# Patient Record
Sex: Female | Born: 1940
Health system: Southern US, Community
[De-identification: ages and names within clinical notes are randomized; demographics above are authoritative.]

## PROBLEM LIST (undated history)

## (undated) DIAGNOSIS — Z9889 Other specified postprocedural states: Secondary | ICD-10-CM

## (undated) DIAGNOSIS — T4145XA Adverse effect of unspecified anesthetic, initial encounter: Secondary | ICD-10-CM

## (undated) DIAGNOSIS — H269 Unspecified cataract: Secondary | ICD-10-CM

## (undated) DIAGNOSIS — T8859XA Other complications of anesthesia, initial encounter: Secondary | ICD-10-CM

## (undated) DIAGNOSIS — I8390 Asymptomatic varicose veins of unspecified lower extremity: Secondary | ICD-10-CM

## (undated) DIAGNOSIS — I10 Essential (primary) hypertension: Secondary | ICD-10-CM

## (undated) DIAGNOSIS — R112 Nausea with vomiting, unspecified: Secondary | ICD-10-CM

## (undated) DIAGNOSIS — T7840XA Allergy, unspecified, initial encounter: Secondary | ICD-10-CM

## (undated) HISTORY — PX: VARICOSE VEIN SURGERY: SHX832

## (undated) HISTORY — PX: APPENDECTOMY: SHX54

## (undated) HISTORY — DX: Unspecified cataract: H26.9

## (undated) HISTORY — DX: Allergy, unspecified, initial encounter: T78.40XA

## (undated) HISTORY — PX: CATARACT EXTRACTION: SUR2

## (undated) HISTORY — PX: HERNIA REPAIR: SHX51

## (undated) HISTORY — PX: ABDOMINAL HYSTERECTOMY: SHX81

---
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
## 2012-07-03 ENCOUNTER — Ambulatory Visit (INDEPENDENT_AMBULATORY_CARE_PROVIDER_SITE_OTHER): Payer: Medicare Other | Admitting: Emergency Medicine

## 2012-07-03 ENCOUNTER — Ambulatory Visit: Payer: Medicare Other

## 2012-07-03 VITALS — BP 155/74 | HR 88 | Temp 98.2°F | Resp 16 | Ht 61.5 in | Wt 125.2 lb

## 2012-07-03 DIAGNOSIS — R51 Headache: Secondary | ICD-10-CM

## 2012-07-03 DIAGNOSIS — J019 Acute sinusitis, unspecified: Secondary | ICD-10-CM

## 2012-07-03 LAB — POCT CBC
Granulocyte percent: 82.4 %G — AB (ref 37–80)
MCV: 95.9 fL (ref 80–97)
MID (cbc): 0.5 (ref 0–0.9)
MPV: 9.2 fL (ref 0–99.8)
POC Granulocyte: 8.2 — AB (ref 2–6.9)
POC LYMPH PERCENT: 12.2 %L (ref 10–50)
POC MID %: 5.4 %M (ref 0–12)
Platelet Count, POC: 231 10*3/uL (ref 142–424)
RDW, POC: 13.4 %

## 2012-07-03 MED ORDER — TRAMADOL HCL 50 MG PO TABS: 50.0000 mg | ORAL_TABLET | Freq: Three times a day (TID) | ORAL | Status: DC | PRN

## 2012-07-03 NOTE — Progress Notes (Signed)
  Subjective:    Patient ID: Abigail Acosta, female    DOB: 1940-04-21, 72 y.o.   MRN: [id number]  HPI nation presents with a severe frontal headache. She is having severe pain across the front of her face and nose. She did not have much drainage. She has had a history of sinus surgery in the past. She denies any fever or chills.    Review of Systems patient has a history of osteoporosis and is currently on Premarin for this.     Objective:   Physical Exam she's had left cataract surgery. The discs are flat. Cranial nerves are intact. There is no focal weakness upper or lower extremities. There is tenderness over the facial area. Posterior pharynx is clear. Neck is supple. Chest is clear. Heart rate no murmurs.  [facility] reading (PRIMARY) by  Dr. Cleta Alberts she has large sinus cavities but I did not see any air-fluid levels.  Results for orders placed in visit on 07/03/12  POCT CBC      Result Value Range   WBC 9.9  4.6 - 10.2 K/uL   Lymph, poc 1.2  0.6 - 3.4   POC LYMPH PERCENT 12.2  10 - 50 %L   MID (cbc) 0.5  0 - 0.9   POC MID % 5.4  0 - 12 %M   POC Granulocyte 8.2 (*) 2 - 6.9   Granulocyte percent 82.4 (*) 37 - 80 %G   RBC 4.85  4.04 - 5.48 M/uL   Hemoglobin 14.8  12.2 - 16.2 g/dL   HCT, POC 40.9  81.1 - 47.9 %   MCV 95.9  80 - 97 fL   MCH, POC 30.5  27 - 31.2 pg   MCHC 31.8  31.8 - 35.4 g/dL   RDW, POC 91.4     Platelet Count, POC 231  142 - 424 K/uL   MPV 9.2  0 - 99.8 fL        Assessment & Plan:   Repeat BP at 11:42: 172/90- manual. Patient does not want to be on an ABX. Pt does not want to do saline rinses or have a scan. She was advised to take her BP meds when she gets home today. She will take Ultram for her HA.

## 2013-04-03 ENCOUNTER — Other Ambulatory Visit: Payer: Self-pay | Admitting: Urology

## 2013-04-13 ENCOUNTER — Encounter (HOSPITAL_COMMUNITY): Payer: Self-pay | Admitting: Pharmacy Technician

## 2013-04-16 ENCOUNTER — Encounter (HOSPITAL_COMMUNITY): Payer: Self-pay

## 2013-04-16 ENCOUNTER — Encounter (HOSPITAL_COMMUNITY)
Admission: RE | Admit: 2013-04-16 | Discharge: 2013-04-16 | Disposition: A | Discharge status: Home / Self Care | Payer: Medicare Other | Source: Ambulatory Visit | Attending: Urology | Admitting: Urology

## 2013-04-16 ENCOUNTER — Ambulatory Visit (HOSPITAL_COMMUNITY)
Admission: RE | Admit: 2013-04-16 | Discharge: 2013-04-16 | Disposition: A | Discharge status: Home / Self Care | Payer: Medicare Other | Source: Ambulatory Visit | Attending: Anesthesiology | Admitting: Anesthesiology

## 2013-04-16 DIAGNOSIS — Z01812 Encounter for preprocedural laboratory examination: Secondary | ICD-10-CM | POA: Insufficient documentation

## 2013-04-16 DIAGNOSIS — Z01818 Encounter for other preprocedural examination: Secondary | ICD-10-CM | POA: Insufficient documentation

## 2013-04-16 DIAGNOSIS — Z01810 Encounter for preprocedural cardiovascular examination: Secondary | ICD-10-CM | POA: Insufficient documentation

## 2013-04-16 HISTORY — DX: Asymptomatic varicose veins of unspecified lower extremity: I83.90

## 2013-04-16 LAB — BASIC METABOLIC PANEL
BUN: 19 mg/dL (ref 6–23)
CALCIUM: 9.8 mg/dL (ref 8.4–10.5)
CO2: 29 mEq/L (ref 19–32)
Chloride: 98 mEq/L (ref 96–112)
Creatinine, Ser: 0.95 mg/dL (ref 0.50–1.10)
GFR calc Af Amer: 68 mL/min — ABNORMAL LOW (ref >90)
GFR, EST NON AFRICAN AMERICAN: 59 mL/min — AB (ref >90)
Glucose, Bld: 88 mg/dL (ref 70–99)
Potassium: 3.5 mEq/L — ABNORMAL LOW (ref 3.7–5.3)
SODIUM: 140 meq/L (ref 137–147)

## 2013-04-16 LAB — CBC
HEMATOCRIT: 44 % (ref 36.0–46.0)
Hemoglobin: 15 g/dL (ref 12.0–15.0)
MCH: 31.1 pg (ref 26.0–34.0)
MCHC: 34.1 g/dL (ref 30.0–36.0)
MCV: 91.1 fL (ref 78.0–100.0)
Platelets: 212 10*3/uL (ref 150–400)
RBC: 4.83 MIL/uL (ref 3.87–5.11)
RDW: 12.8 % (ref 11.5–15.5)
WBC: 6.9 10*3/uL (ref 4.0–10.5)

## 2013-04-16 LAB — PROTIME-INR
INR: 0.97 (ref 0.00–1.49)
Prothrombin Time: 12.7 seconds (ref 11.6–15.2)

## 2013-04-16 LAB — APTT: APTT: 34 s (ref 24–37)

## 2013-04-16 NOTE — Pre-Procedure Instructions (Signed)
04-16-13 EKG/ CXR done today.

## 2013-04-16 NOTE — Patient Instructions (Signed)
[place]  04/16/2013   Your procedure is scheduled on:  4-14 -2015  Report to [facility] at    [place]    AM .  Call this number if you have problems the morning of surgery: [phone]  Or Presurgical Testing [phone](Vaishali Baise) For Living Will and/or Health Care Power Attorney Forms: please provide copy for your medical record,may bring AM of surgery(Forms should be already notarized -we do not provide this service).(04-16-13 To bring copy AM of 04-28-13 to place with medical records).  Remember: Follow any bowel prep instructions per MD office.   Do not eat food:After Midnight.  .  Take these medicines the morning of surgery with A SIP OF WATER: none, may take Zyrtec if needed.   Do not wear jewelry, make-up or nail polish.  Do not wear lotions, powders, or perfumes. You may wear deodorant.  Do not shave 48 hours(2 days) prior to first CHG shower(legs and under arms).(Shaving face and neck okay.)  Do not bring valuables to the hospital.(Hospital is not responsible for lost valuables).  Contacts, dentures or removable bridgework, body piercing, hair pins may not be worn into surgery.  Leave suitcase in the car. After surgery it may be brought to your room.  For patients admitted to the hospital, checkout time is 11:00 AM the day of discharge.(Restricted visitors-Any Persons displaying flu-like symptoms or illness).    Patients discharged the day of surgery will not be allowed to drive home. Must have responsible person with you x 24 hours once discharged.  Name and phone number of your driver: spouse Soo Steelman [phone] cell  Special Instructions: CHG(Chlorhedine 4%-"Hibiclens","Betasept","Aplicare") Shower Use Special Wash: see special instructions.(avoid face and genitals)   Please read over the following fact sheets that you were given:  Blood Transfusion fact sheet. Remember : Type/Screen "Blue armbands" - may not be removed once applied(would result in  being retested AM of surgery, if removed).  Failure to follow these instructions may result in Cancellation of your surgery.   Patient signature_______________________________________________________

## 2013-04-27 MED ORDER — GENTAMICIN SULFATE 40 MG/ML IJ SOLN
280.0000 mg | INTRAVENOUS | Status: AC
  Administered 2013-04-28: 280 mg via INTRAVENOUS
  Filled 2013-04-27: qty 7

## 2013-04-27 NOTE — H&P (Signed)
History of Present Illness   Abigail Acosta has symptomatic pelvic organ prolapse. She has had a hysterectomy. She has moderate nocturia and milder frequency. When she splints, she has a good flow. She has had 2 right hernia repair with a femoral and/or inguinal hernia repair and even had mesh. She is an active person. She leaks a little bit when she squats but generally is continent not wearing pads and denies urgency. On pelvic examination she had a grade 3 cystocele with central defect. The vaginal cuff descended from 8 or 9 cm to 5 cm. She had a mild grade 2 rectocele that was fairly diffuse. She did not have an obvious enterocele. She had a negative cough test after cystoscopy.   If she ever had surgery, she likely benefit from a transvaginal vault suspension with cystocele repair and graft and she may need a rectocele repair. She had a little bit of deficiency of the posterior fourchette and a final decision will be made intraoperatively. [place] She wants to have surgery soon because she is very active in the garden.   She did not void and was catheterized for 75 mL. Maximum capacity was 250 mL. Bladder was stable. She had sensory urgency. She did not leak with a Valsalva pressure of 92 cmH2O. She could not cough that hard. During voluntary voiding, she voided 245 mL with a maximum flow of 18 mL/sec. Maximum voiding pressure was 23 cmH2O. She emptied efficiently. Bladder neck descended 3 cm. She has a significant cystocele fluoroscopically. EMG activity was normal during the voiding phase. The details of the urodynamics are signed and dictated on the urodynamic sheet.    Past Medical History Problems  1. History of hypercholesterolemia (V12.29) 2. History of hypertension (V12.59)  Surgical History Problems  1. History of Hernia Repair 2. History of Hysterectomy 3. History of Tonsillectomy 4. History of Varicose Vein Ligation  Current Meds 1.  Captopril-Hydrochlorothiazide 25-25 MG Oral Tablet;  Therapy: (Recorded:14Jan2015) to Recorded 2. Potassium TABS;  Therapy: (Recorded:14Jan2015) to Recorded 3. Premarin TABS;  Therapy: (Recorded:14Jan2015) to Recorded 4. Vitamin B12 TABS;  Therapy: (Recorded:14Jan2015) to Recorded 5. Vitamin C TABS;  Therapy: (Recorded:14Jan2015) to Recorded 6. Vitamin D TABS;  Therapy: (Recorded:14Jan2015) to Recorded  Allergies Medication  1. No Known Drug Allergies Non-Medication  2. Dust 3. Grass 4. Mold  Family History Problems  1. Family history of lung cancer (V16.1) : Mother, Father  Social History Problems  1. Denied: History of Alcohol use 2. Caffeine use (V49.89)   6 cups of coffee 3. Father deceased 3. Former smoker [place])   smoked 3 cigs daily for 2 years; quit 50 years ago 53. Married 6. Mother deceased 70. 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 Cystocele, midline (618.01) 2. Increased urinary frequency (788.41)  Discussion/Summary   The patient is sexually active. Mesh issues discussed. We had a long chat about whether  or not she should proceed with surgery and I truly read Abigail Krausz that she would like to have surgery because it does certainly effect her quality of life. She does a lot of splinting each time she urinates, except the first thing in the morning when she has been supine. I think I scheduled time with Pam and she will meet Pam today, but she can always cancel.   Abigail Lundy thinks she feels a  little bit of pressure when she urinates from the bowel. This will be taken into consideration when I am making a decision intraoperatively. De novo or worsening incontinence also discussed, but we are not going to do a sling.  After a thorough review of the management options for the patient's condition the patient  elected to proceed with surgical therapy as noted above. We have discussed the potential benefits and risks of the procedure, side effects of the proposed treatment, the likelihood of the patient achieving the goals of the procedure, and any potential problems that might occur during the procedure or recuperation. Informed consent has been obtained.

## 2013-04-28 ENCOUNTER — Observation Stay (HOSPITAL_COMMUNITY)
Admission: RE | Admit: 2013-04-28 | Discharge: 2013-04-29 | Disposition: A | Discharge status: Home / Self Care | Payer: Medicare Other | Source: Ambulatory Visit | Attending: Urology | Admitting: Urology

## 2013-04-28 ENCOUNTER — Ambulatory Visit (HOSPITAL_COMMUNITY): Payer: Medicare Other | Admitting: Anesthesiology

## 2013-04-28 ENCOUNTER — Encounter (HOSPITAL_COMMUNITY)
Admission: RE | Disposition: A | Discharge status: Home / Self Care | Payer: Self-pay | Source: Ambulatory Visit | Attending: Urology

## 2013-04-28 ENCOUNTER — Encounter (HOSPITAL_COMMUNITY): Payer: Self-pay | Admitting: *Deleted

## 2013-04-28 ENCOUNTER — Encounter (HOSPITAL_COMMUNITY): Payer: Medicare Other | Admitting: Anesthesiology

## 2013-04-28 DIAGNOSIS — Z87891 Personal history of nicotine dependence: Secondary | ICD-10-CM | POA: Insufficient documentation

## 2013-04-28 DIAGNOSIS — R350 Frequency of micturition: Secondary | ICD-10-CM | POA: Insufficient documentation

## 2013-04-28 DIAGNOSIS — Z79899 Other long term (current) drug therapy: Secondary | ICD-10-CM | POA: Insufficient documentation

## 2013-04-28 DIAGNOSIS — N993 Prolapse of vaginal vault after hysterectomy: Principal | ICD-10-CM | POA: Insufficient documentation

## 2013-04-28 DIAGNOSIS — IMO0002 Reserved for concepts with insufficient information to code with codable children: Secondary | ICD-10-CM | POA: Diagnosis present

## 2013-04-28 DIAGNOSIS — E7800 Pure hypercholesterolemia, unspecified: Secondary | ICD-10-CM | POA: Insufficient documentation

## 2013-04-28 DIAGNOSIS — I10 Essential (primary) hypertension: Secondary | ICD-10-CM | POA: Insufficient documentation

## 2013-04-28 DIAGNOSIS — R109 Unspecified abdominal pain: Secondary | ICD-10-CM | POA: Insufficient documentation

## 2013-04-28 HISTORY — PX: CYSTOSCOPY: SHX5120

## 2013-04-28 HISTORY — PX: ANTERIOR AND POSTERIOR REPAIR: SHX5121

## 2013-04-28 HISTORY — PX: VAGINAL PROLAPSE REPAIR: SHX830

## 2013-04-28 LAB — ABO/RH: ABO/RH(D): O POS

## 2013-04-28 LAB — HEMOGLOBIN AND HEMATOCRIT, BLOOD
HCT: 39.7 % (ref 36.0–46.0)
HEMOGLOBIN: 13.4 g/dL (ref 12.0–15.0)

## 2013-04-28 LAB — TYPE AND SCREEN
ABO/RH(D): O POS
Antibody Screen: NEGATIVE

## 2013-04-28 SURGERY — CYSTOSCOPY
Anesthesia: General

## 2013-04-28 MED ORDER — METHYLENE BLUE 1 % INJ SOLN
INTRAMUSCULAR | Status: AC
  Filled 2013-04-28: qty 10

## 2013-04-28 MED ORDER — PROPOFOL 10 MG/ML IV BOLUS
INTRAVENOUS | Status: AC
  Filled 2013-04-28: qty 20

## 2013-04-28 MED ORDER — HYDROCODONE-ACETAMINOPHEN 5-325 MG PO TABS: 1.0000 | ORAL_TABLET | Freq: Four times a day (QID) | ORAL | Status: DC | PRN

## 2013-04-28 MED ORDER — ONDANSETRON HCL 4 MG/2ML IJ SOLN
INTRAMUSCULAR | Status: AC
  Filled 2013-04-28: qty 2

## 2013-04-28 MED ORDER — MIDAZOLAM HCL 5 MG/5ML IJ SOLN
INTRAMUSCULAR | Status: DC | PRN
  Administered 2013-04-28: 2 mg via INTRAVENOUS

## 2013-04-28 MED ORDER — PROPOFOL 10 MG/ML IV BOLUS
INTRAVENOUS | Status: DC | PRN
  Administered 2013-04-28: 170 mg via INTRAVENOUS

## 2013-04-28 MED ORDER — NALOXONE HCL 0.4 MG/ML IJ SOLN: 0.4000 mg | INTRAMUSCULAR | Status: DC | PRN

## 2013-04-28 MED ORDER — PROMETHAZINE HCL 25 MG/ML IJ SOLN
6.2500 mg | INTRAMUSCULAR | Status: DC | PRN
  Administered 2013-04-28: 6.25 mg via INTRAVENOUS

## 2013-04-28 MED ORDER — MORPHINE SULFATE 2 MG/ML IJ SOLN
2.0000 mg | INTRAMUSCULAR | Status: DC | PRN
  Administered 2013-04-28 (×2): 2 mg via INTRAVENOUS
  Filled 2013-04-28 (×2): qty 1

## 2013-04-28 MED ORDER — EPHEDRINE SULFATE 50 MG/ML IJ SOLN: INTRAMUSCULAR | Status: DC | PRN

## 2013-04-28 MED ORDER — DEXAMETHASONE SODIUM PHOSPHATE 4 MG/ML IJ SOLN
INTRAMUSCULAR | Status: DC | PRN
  Administered 2013-04-28: 10 mg via INTRAVENOUS

## 2013-04-28 MED ORDER — STERILE WATER FOR IRRIGATION IR SOLN
Status: DC | PRN
  Administered 2013-04-28: 3000 mL

## 2013-04-28 MED ORDER — ESTRADIOL 0.1 MG/GM VA CREA
TOPICAL_CREAM | VAGINAL | Status: AC
  Filled 2013-04-28: qty 85

## 2013-04-28 MED ORDER — ESTRADIOL 0.1 MG/GM VA CREA
TOPICAL_CREAM | VAGINAL | Status: DC | PRN
  Administered 2013-04-28: 1 via VAGINAL

## 2013-04-28 MED ORDER — CISATRACURIUM BESYLATE (PF) 10 MG/5ML IV SOLN
INTRAVENOUS | Status: DC | PRN
  Administered 2013-04-28: 10 mg via INTRAVENOUS

## 2013-04-28 MED ORDER — EPHEDRINE SULFATE 50 MG/ML IJ SOLN
INTRAMUSCULAR | Status: DC | PRN
  Administered 2013-04-28: 5 mg via INTRAVENOUS

## 2013-04-28 MED ORDER — ESTROGENS CONJUGATED 0.45 MG PO TABS
0.4500 mg | ORAL_TABLET | Freq: Every day | ORAL | Status: DC
  Administered 2013-04-29: 0.45 mg via ORAL
  Filled 2013-04-28 (×2): qty 1

## 2013-04-28 MED ORDER — ONDANSETRON HCL 4 MG/2ML IJ SOLN
INTRAMUSCULAR | Status: DC | PRN
  Administered 2013-04-28: 4 mg via INTRAVENOUS

## 2013-04-28 MED ORDER — OXYCODONE HCL 5 MG/5ML PO SOLN
5.0000 mg | Freq: Once | ORAL | Status: DC | PRN
  Filled 2013-04-28: qty 5

## 2013-04-28 MED ORDER — SODIUM CHLORIDE 0.9 % IR SOLN
Status: DC | PRN
  Administered 2013-04-28: 08:00:00

## 2013-04-28 MED ORDER — PHENAZOPYRIDINE HCL 200 MG PO TABS
200.0000 mg | ORAL_TABLET | Freq: Once | ORAL | Status: AC
  Administered 2013-04-28: 200 mg via ORAL
  Filled 2013-04-28: qty 1

## 2013-04-28 MED ORDER — HYDROCHLOROTHIAZIDE 25 MG PO TABS
25.0000 mg | ORAL_TABLET | Freq: Every day | ORAL | Status: DC
  Administered 2013-04-28 – 2013-04-29 (×2): 25 mg via ORAL
  Filled 2013-04-28 (×3): qty 1

## 2013-04-28 MED ORDER — LIDOCAINE HCL (CARDIAC) 20 MG/ML IV SOLN
INTRAVENOUS | Status: DC | PRN
  Administered 2013-04-28: 80 mg via INTRAVENOUS

## 2013-04-28 MED ORDER — MORPHINE SULFATE (PF) 1 MG/ML IV SOLN
INTRAVENOUS | Status: DC
  Administered 2013-04-28: 5 mg via INTRAVENOUS
  Administered 2013-04-28: 16:00:00 via INTRAVENOUS
  Administered 2013-04-29 (×2): 1 mg via INTRAVENOUS
  Administered 2013-04-29: 2 mg via INTRAVENOUS
  Filled 2013-04-28: qty 25

## 2013-04-28 MED ORDER — LIDOCAINE HCL (CARDIAC) 20 MG/ML IV SOLN
INTRAVENOUS | Status: AC
Start: 2013-04-28 — End: 2013-04-28
  Filled 2013-04-28: qty 5

## 2013-04-28 MED ORDER — DIPHENHYDRAMINE HCL 12.5 MG/5ML PO ELIX: 12.5000 mg | ORAL_SOLUTION | Freq: Four times a day (QID) | ORAL | Status: DC | PRN

## 2013-04-28 MED ORDER — DEXAMETHASONE SODIUM PHOSPHATE 10 MG/ML IJ SOLN
INTRAMUSCULAR | Status: AC
  Filled 2013-04-28: qty 1

## 2013-04-28 MED ORDER — NEOSTIGMINE METHYLSULFATE 1 MG/ML IJ SOLN
INTRAMUSCULAR | Status: DC | PRN
  Administered 2013-04-28: 2 mg via INTRAVENOUS

## 2013-04-28 MED ORDER — SODIUM CHLORIDE 0.9 % IJ SOLN
INTRAMUSCULAR | Status: AC
  Filled 2013-04-28: qty 10

## 2013-04-28 MED ORDER — GLYCOPYRROLATE 0.2 MG/ML IJ SOLN
INTRAMUSCULAR | Status: DC | PRN
  Administered 2013-04-28: .2 mg via INTRAVENOUS

## 2013-04-28 MED ORDER — LIDOCAINE-EPINEPHRINE (PF) 1 %-1:200000 IJ SOLN
INTRAMUSCULAR | Status: DC | PRN
  Administered 2013-04-28: 47 mL

## 2013-04-28 MED ORDER — EPHEDRINE SULFATE 50 MG/ML IJ SOLN
INTRAMUSCULAR | Status: AC
  Filled 2013-04-28: qty 1

## 2013-04-28 MED ORDER — CAPTOPRIL 25 MG PO TABS
25.0000 mg | ORAL_TABLET | Freq: Every day | ORAL | Status: DC
  Administered 2013-04-28 – 2013-04-29 (×2): 25 mg via ORAL
  Filled 2013-04-28 (×3): qty 1

## 2013-04-28 MED ORDER — PROMETHAZINE HCL 25 MG/ML IJ SOLN
INTRAMUSCULAR | Status: AC
  Filled 2013-04-28: qty 1

## 2013-04-28 MED ORDER — SODIUM CHLORIDE 0.9 % IV SOLN
1.5000 g | INTRAVENOUS | Status: AC
  Administered 2013-04-28: 1.5 g via INTRAVENOUS

## 2013-04-28 MED ORDER — ONDANSETRON HCL 4 MG/2ML IJ SOLN: 4.0000 mg | Freq: Four times a day (QID) | INTRAMUSCULAR | Status: DC | PRN

## 2013-04-28 MED ORDER — HYDROMORPHONE HCL PF 1 MG/ML IJ SOLN: 0.2500 mg | INTRAMUSCULAR | Status: DC | PRN

## 2013-04-28 MED ORDER — CISATRACURIUM BESYLATE 20 MG/10ML IV SOLN
INTRAVENOUS | Status: AC
  Filled 2013-04-28: qty 10

## 2013-04-28 MED ORDER — CAPTOPRIL-HYDROCHLOROTHIAZIDE 25-25 MG PO TABS
1.0000 | ORAL_TABLET | Freq: Every morning | ORAL | Status: DC
Start: 2013-04-28 — End: 2013-04-28

## 2013-04-28 MED ORDER — OXYCODONE HCL 5 MG PO TABS: 5.0000 mg | ORAL_TABLET | Freq: Once | ORAL | Status: DC | PRN

## 2013-04-28 MED ORDER — FENTANYL CITRATE 0.05 MG/ML IJ SOLN
INTRAMUSCULAR | Status: AC
  Filled 2013-04-28: qty 5

## 2013-04-28 MED ORDER — MEPERIDINE HCL 50 MG/ML IJ SOLN: 6.2500 mg | INTRAMUSCULAR | Status: DC | PRN

## 2013-04-28 MED ORDER — HYDROCODONE-ACETAMINOPHEN 5-325 MG PO TABS
1.0000 | ORAL_TABLET | ORAL | Status: DC | PRN
  Administered 2013-04-29: 1 via ORAL
  Filled 2013-04-28: qty 1

## 2013-04-28 MED ORDER — DEXTROSE-NACL 5-0.45 % IV SOLN
INTRAVENOUS | Status: DC
  Administered 2013-04-28 – 2013-04-29 (×3): via INTRAVENOUS

## 2013-04-28 MED ORDER — SODIUM CHLORIDE 0.9 % IJ SOLN: 9.0000 mL | INTRAMUSCULAR | Status: DC | PRN

## 2013-04-28 MED ORDER — SODIUM CHLORIDE 0.9 % IV SOLN
INTRAVENOUS | Status: AC
  Filled 2013-04-28: qty 1.5

## 2013-04-28 MED ORDER — FENTANYL CITRATE 0.05 MG/ML IJ SOLN
INTRAMUSCULAR | Status: DC | PRN
  Administered 2013-04-28 (×5): 25 ug via INTRAVENOUS
  Administered 2013-04-28 (×2): 50 ug via INTRAVENOUS

## 2013-04-28 MED ORDER — SUCCINYLCHOLINE CHLORIDE 20 MG/ML IJ SOLN
INTRAMUSCULAR | Status: DC | PRN
  Administered 2013-04-28: 100 mg via INTRAVENOUS

## 2013-04-28 MED ORDER — HYOSCYAMINE SULFATE 0.125 MG SL SUBL
0.4000 mg | SUBLINGUAL_TABLET | Freq: Four times a day (QID) | SUBLINGUAL | Status: DC | PRN
  Administered 2013-04-28 (×2): 0.375 mg via SUBLINGUAL
  Filled 2013-04-28 (×2): qty 3

## 2013-04-28 MED ORDER — ACETAMINOPHEN 325 MG PO TABS: 650.0000 mg | ORAL_TABLET | ORAL | Status: DC | PRN

## 2013-04-28 MED ORDER — ONDANSETRON HCL 4 MG/2ML IJ SOLN: 4.0000 mg | INTRAMUSCULAR | Status: DC | PRN

## 2013-04-28 MED ORDER — DIPHENHYDRAMINE HCL 50 MG/ML IJ SOLN: 12.5000 mg | Freq: Four times a day (QID) | INTRAMUSCULAR | Status: DC | PRN

## 2013-04-28 MED ORDER — LACTATED RINGERS IV SOLN
INTRAVENOUS | Status: DC | PRN
  Administered 2013-04-28 (×3): via INTRAVENOUS

## 2013-04-28 MED ORDER — MIDAZOLAM HCL 2 MG/2ML IJ SOLN
INTRAMUSCULAR | Status: AC
  Filled 2013-04-28: qty 2

## 2013-04-28 SURGICAL SUPPLY — 66 items
BAG URINE DRAINAGE (UROLOGICAL SUPPLIES) ×3 IMPLANT
BLADE HEX COATED 2.75 (ELECTRODE) ×3 IMPLANT
BLADE SURG 15 STRL LF DISP TIS (BLADE) ×2 IMPLANT
BLADE SURG 15 STRL SS (BLADE) ×4
CATH FOLEY 2WAY SLVR  5CC 14FR (CATHETERS) ×2
CATH FOLEY 2WAY SLVR  5CC 16FR (CATHETERS) ×2
CATH FOLEY 2WAY SLVR 5CC 14FR (CATHETERS) ×1 IMPLANT
CATH FOLEY 2WAY SLVR 5CC 16FR (CATHETERS) ×1 IMPLANT
COVER MAYO STAND STRL (DRAPES) IMPLANT
COVER SURGICAL LIGHT HANDLE (MISCELLANEOUS) IMPLANT
DECANTER SPIKE VIAL GLASS SM (MISCELLANEOUS) IMPLANT
DERMABOND ADVANCED (GAUZE/BANDAGES/DRESSINGS)
DERMABOND ADVANCED .7 DNX12 (GAUZE/BANDAGES/DRESSINGS) IMPLANT
DEVICE CAPIO SLIM SINGLE (INSTRUMENTS) ×3 IMPLANT
DEVICE CAPIO SUTURING (INSTRUMENTS) ×2
DEVICE CAPIO SUTURING OPC (INSTRUMENTS) ×1 IMPLANT
DRAIN PENROSE 18X1/4 LTX STRL (WOUND CARE) ×3 IMPLANT
DRAPE LG THREE QUARTER DISP (DRAPES) ×3 IMPLANT
ELECT REM PT RETURN 9FT ADLT (ELECTROSURGICAL) ×3
ELECTRODE REM PT RTRN 9FT ADLT (ELECTROSURGICAL) ×1 IMPLANT
GAUZE PACKING 2X5 YD STRL (GAUZE/BANDAGES/DRESSINGS) ×3 IMPLANT
GAUZE SPONGE 4X4 16PLY XRAY LF (GAUZE/BANDAGES/DRESSINGS) ×9 IMPLANT
GLOVE BIO SURGEON STRL SZ 6.5 (GLOVE) ×2 IMPLANT
GLOVE BIO SURGEON STRL SZ7.5 (GLOVE) ×3 IMPLANT
GLOVE BIO SURGEONS STRL SZ 6.5 (GLOVE) ×1
GLOVE BIOGEL M STRL SZ7.5 (GLOVE) ×6 IMPLANT
GLOVE ECLIPSE 8.5 STRL (GLOVE) ×6 IMPLANT
GOWN STRL REUS W/TWL XL LVL3 (GOWN DISPOSABLE) ×6 IMPLANT
HOLDER FOLEY CATH W/STRAP (MISCELLANEOUS) ×3 IMPLANT
IV NS 1000ML (IV SOLUTION)
IV NS 1000ML BAXH (IV SOLUTION) IMPLANT
KIT BASIN OR (CUSTOM PROCEDURE TRAY) ×3 IMPLANT
NEEDLE HYPO 22GX1.5 SAFETY (NEEDLE) ×3 IMPLANT
NEEDLE MAYO .5 CIRCLE (NEEDLE) ×3 IMPLANT
NEEDLE MAYO 6 CRC TAPER PT (NEEDLE) IMPLANT
NS IRRIG 1000ML POUR BTL (IV SOLUTION) IMPLANT
PACK CYSTO (CUSTOM PROCEDURE TRAY) ×3 IMPLANT
PENCIL BUTTON HOLSTER BLD 10FT (ELECTRODE) ×3 IMPLANT
PLUG CATH AND CAP STER (CATHETERS) ×3 IMPLANT
POSITIONER SURGICAL ARM (MISCELLANEOUS) ×3 IMPLANT
RETRACTOR STAY HOOK 5MM (MISCELLANEOUS) ×6 IMPLANT
SHEET LAVH (DRAPES) ×3 IMPLANT
SLEEVE SURGEON STRL (DRAPES) ×3 IMPLANT
SUT CAPIO ETHIBPND (SUTURE) ×6 IMPLANT
SUT SILK 2 0 SH (SUTURE) IMPLANT
SUT VIC AB 0 CT1 27 (SUTURE) ×2
SUT VIC AB 0 CT1 27XBRD ANTBC (SUTURE) ×1 IMPLANT
SUT VIC AB 2-0 CT1 27 (SUTURE) ×4
SUT VIC AB 2-0 CT1 27XBRD (SUTURE) ×2 IMPLANT
SUT VIC AB 2-0 SH 27 (SUTURE) ×6
SUT VIC AB 2-0 SH 27X BRD (SUTURE) ×3 IMPLANT
SUT VIC AB 3-0 PS2 18 (SUTURE)
SUT VIC AB 3-0 PS2 18XBRD (SUTURE) IMPLANT
SUT VIC AB 3-0 SH 27 (SUTURE) ×4
SUT VIC AB 3-0 SH 27XBRD (SUTURE) ×2 IMPLANT
SUT VIC AB 4-0 PS2 27 (SUTURE) IMPLANT
SUT VICRYL 0 UR6 27IN ABS (SUTURE) ×6 IMPLANT
SYRINGE 10CC LL (SYRINGE) ×3 IMPLANT
TISSUE REPAIR XENFORM 6X10CM (Tissue) ×3 IMPLANT
TOWEL OR 17X26 10 PK STRL BLUE (TOWEL DISPOSABLE) ×3 IMPLANT
TOWEL OR NON WOVEN STRL DISP B (DISPOSABLE) ×3 IMPLANT
TUBING CONNECTING 10 (TUBING) ×4 IMPLANT
TUBING CONNECTING 10' (TUBING) ×2
WATER STERILE IRR 1500ML POUR (IV SOLUTION) IMPLANT
WATER STERILE IRR 500ML POUR (IV SOLUTION) IMPLANT
YANKAUER SUCT BULB TIP 10FT TU (MISCELLANEOUS) ×3 IMPLANT

## 2013-04-28 NOTE — Progress Notes (Signed)
Pt now resting more comfortably after PCA started per orders and PRN for Bladder Spasms given SL. Will continue to monitor PT closely and maintain current plan of care

## 2013-04-28 NOTE — Anesthesia Preprocedure Evaluation (Signed)
Anesthesia Evaluation  Patient identified by MRN, date of birth, ID band Patient awake    Reviewed: Allergy & Precautions, H&P , NPO status , Patient's Chart, lab work & pertinent test results  Airway Mallampati: II TM Distance: >3 FB Neck ROM: Full    Dental  (+) Dental Advisory Given   Pulmonary neg pulmonary ROS,  breath sounds clear to auscultation        Cardiovascular negative cardio ROS  Rhythm:Regular Rate:Normal     Neuro/Psych negative neurological ROS  negative psych ROS   GI/Hepatic negative GI ROS, Neg liver ROS,   Endo/Other  negative endocrine ROS  Renal/GU negative Renal ROS     Musculoskeletal negative musculoskeletal ROS (+)   Abdominal   Peds  Hematology negative hematology ROS (+)   Anesthesia Other Findings   Reproductive/Obstetrics negative OB ROS                           Anesthesia Physical Anesthesia Plan  ASA: II  Anesthesia Plan: General   Post-op Pain Management:    Induction: Intravenous  Airway Management Planned: LMA  Additional Equipment:   Intra-op Plan:   Post-operative Plan: Extubation in OR  Informed Consent: I have reviewed the patients History and Physical, chart, labs and discussed the procedure including the risks, benefits and alternatives for the proposed anesthesia with the patient or authorized representative who has indicated his/her understanding and acceptance.   Dental advisory given  Plan Discussed with: CRNA  Anesthesia Plan Comments:         Anesthesia Quick Evaluation

## 2013-04-28 NOTE — Interval H&P Note (Signed)
History and Physical Interval Note:  04/28/2013 7:26 AM  Abigail Acosta  has presented today for surgery, with the diagnosis of CYSTOCELE RECTOCELE VAULT PROLAPSE   The various methods of treatment have been discussed with the patient and family. After consideration of risks, benefits and other options for treatment, the patient has consented to  Procedure(s): CYSTOSCOPY (N/A) [place] (N/A) VAULT PROLAPSE AND GRAFT   (N/A) as a surgical intervention .  The patient's history has been reviewed, patient examined, no change in status, stable for surgery.  I have reviewed the patient's chart and labs.  Questions were answered to the patient's satisfaction.     [name] A Aikam Hellickson

## 2013-04-28 NOTE — Anesthesia Postprocedure Evaluation (Signed)
Anesthesia Post Note  Patient: Abigail Acosta  Procedure(s) Performed: Procedure(s) (LRB): CYSTOSCOPY (N/A) REPAIR CYSTOCELE AND RECTOCELE (N/A) VAULT PROLAPSE AND GRAFT   (N/A)  Anesthesia type: General  Patient location: PACU  Post pain: Pain level controlled  Post assessment: Post-op Vital signs reviewed  Last Vitals: BP 152/69  Pulse 87  Temp(Src) 36.9 C (Oral)  Resp 18  Ht 5\' 2"  (1.575 m)  Wt 123 lb 12.4 oz (56.145 kg)  BMI 22.63 kg/m2  SpO2 97%  Post vital signs: Reviewed  Level of consciousness: sedated  Complications: No apparent anesthesia complications

## 2013-04-28 NOTE — Progress Notes (Signed)
MD given update via phone regarding Pt's unrelieved Pain to lower abdomen. MD in to see Pt and new orders given for pain management.

## 2013-04-28 NOTE — Op Note (Signed)
Preoperative diagnosis: Vault prolapse and cystocele and rectocele Postoperative diagnoses vault prolapse and cystocele and rectocele Surgery: Vault prolapse repair and cystocele repair and graft and rectocele repair and cystoscopy Surgeon: Dr. Nicki Reaper Keiasia Christianson Assistant: Leta Baptist  The patient has the above diagnoses and consented to the above procedure. Extra care was taken with leg positioning to minimize the risk of compartment syndrome neuropathy and deep vein thrombosis. Preoperative laboratory tests were normal. Preoperative antibiotics were given  She had reasonable vaginal length at the beginning of the case. Vaginal apex was easily identified with a moderate grade 3 cystocele and central defect and a mild grade 2 rectocele with elasticity of her tissues. I placed a 3-0 Vicryl at the apex. I instilled 25 cc of a lidocaine epinephrine mixture anteriorly. Between 3 Allis clamps I made a T-shaped anterior vaginal wall incision. I sharply and bluntly dissected to the white line bilaterally and mobilized nicely at the apex. I did a 2 layer anterior repair keeping good anterior length and not distorting the anatomy. I did not imbricate the bladder neck. She had no obvious enterocele apically   I cystoscoped the patient. Cystoscopically she good cystocele repair. She had excellent yellow jets bilaterally. There was no distortion of the ureters  I finger dissected along the appropriate plane to the ischial spine bilaterally. I swept soft tissue medially. I was very happy with this dissection. I placed a 0 Ethibond 1 full finger breath medial to the ishcial spine bilaterally with a Capio device. I double checked a rectal examination and there was no rectal injury or suture in the rectum.  Appropriate sharp dissection was utilized at the urethrovesical angle. Iplaced a 0 Vicryl suture in the pelvic sidewall. A 10 x 6 dermal graft appropriately prepared was cut in the shape of a trapezoid. It was  sewn in place between the 4 sutures noted tension-free.  I trimmed a minimal amount and appropriate amount of anterior vaginal wall mucosa. I closed the anterior vaginal wall with running 2-0 Vicryl and CT1 needle.  I then did a rectal examination. She had a modest grade 2 rectocele with deficiency of the posterior fourchette. I could see how it may be symptomatic. She had modest elasticity of her tissues. She the distal scar from a probable episiotomy. I felt she would benefit from a rectocele repair.  2 Allis clamps were placed at the hymenal ring appropriately. I removed a small triangle of perineal skin. I instilled 20 cc of a lidocaine epinephrine mixture. I made a long posterior vaginal wall incision and sharply and bluntly mobilized the thin posterior vaginal wall mucosa from the underlying rectovaginal fascia to the sidewall bilaterally. I  mobilized at the apex. There was no enterocele  Repeat rectal examination identified a moderate grade 2 rectocele. She had thinning of the rectovaginal fascia. I did not think a graft was needed. I did a gentle 2 layer posterior repair with running 2-0 Vicryl. A repeat rectal examination identified a good repair and no injury. I trimmed almost no vaginal wall mucosa bilaterally. I closed the posterior vaginal wall with running 2-0 Vicryl on a CT1 needle. It was exteriorize to the perineum. I did 1 gentle 0 Vicryl suture perineal repair. I then closed the skin with running subcuticular suture.  At the end of the case she had excellent vaginal length. She good support anteriorly and posteriorly. Leg position was excellent. Urine output was low initially but was very good at the end of the case. Vaginal pack  with Estrace cream was inserted. Patient taken to recover room. Blood loss was less than 100 cc. Hopefully the operation will reach the patient's treatment goal

## 2013-04-28 NOTE — Transfer of Care (Signed)
Immediate Anesthesia Transfer of Care Note  Patient: Abigail Acosta  Procedure(s) Performed: Procedure(s): CYSTOSCOPY (N/A) REPAIR CYSTOCELE AND RECTOCELE (N/A) VAULT PROLAPSE AND GRAFT   (N/A)  Patient Location: PACU  Anesthesia Type:General  Level of Consciousness: awake, alert  and oriented  Airway & Oxygen Therapy: Patient Spontanous Breathing and Patient connected to face mask oxygen  Post-op Assessment: Report given to PACU RN and Post -op Vital signs reviewed and stable  Post vital signs: Reviewed and stable  Complications: No apparent anesthesia complications

## 2013-04-29 ENCOUNTER — Encounter (HOSPITAL_COMMUNITY): Payer: Self-pay | Admitting: Urology

## 2013-04-29 LAB — BASIC METABOLIC PANEL
BUN: 13 mg/dL (ref 6–23)
CHLORIDE: 103 meq/L (ref 96–112)
CO2: 27 mEq/L (ref 19–32)
CREATININE: 0.76 mg/dL (ref 0.50–1.10)
Calcium: 8.4 mg/dL (ref 8.4–10.5)
GFR calc Af Amer: >90 mL/min (ref >90)
GFR calc non Af Amer: 83 mL/min — ABNORMAL LOW (ref >90)
Glucose, Bld: 146 mg/dL — ABNORMAL HIGH (ref 70–99)
Potassium: 3.7 mEq/L (ref 3.7–5.3)
Sodium: 140 mEq/L (ref 137–147)

## 2013-04-29 LAB — HEMOGLOBIN AND HEMATOCRIT, BLOOD
HCT: 34.8 % — ABNORMAL LOW (ref 36.0–46.0)
HEMOGLOBIN: 12.1 g/dL (ref 12.0–15.0)

## 2013-04-29 NOTE — Progress Notes (Signed)
Pt voided 300 cc urine and bladder scan showed PVR 120cc.  Dr. Matilde Sprang notified of results.

## 2013-04-29 NOTE — Care Management Note (Signed)
    Page 1 of 1   04/29/2013     3:50:40 PM   CARE MANAGEMENT NOTE 04/29/2013  Patient:  Abigail Acosta, Abigail Acosta   Account Number:  [id number]  Date Initiated:  04/29/2013  Documentation initiated by:  Dessa Phi  Subjective/Objective Assessment:   73 Y/O F ADMITTED W/CYSTOCELE.     Action/Plan:   FROM HOME.   Anticipated DC Date:  04/29/2013   Anticipated DC Plan:  [place]  CM consult      Choice offered to / List presented to:             Status of service:  Completed, signed off Medicare Important Message given?   (If response is "NO", the following Medicare IM given date fields will be blank) Date Medicare IM given:   Date Additional Medicare IM given:    Discharge Disposition:  HOME/SELF CARE  Per UR Regulation:  Reviewed for med. necessity/level of care/duration of stay  If discussed at [place] of Stay Meetings, dates discussed:    Comments:

## 2013-04-29 NOTE — Discharge Summary (Signed)
Date of admission: 04/28/2013  Date of discharge: 04/29/2013  Admission diagnosis: Cystocele/rectocele  Discharge diagnosis: Cystocele/rectocele  Secondary diagnoses: Vault prolapse  History and Physical: For full details, please see admission history and physical. Briefly, Abigail Acosta is a 73 y.o. year old patient with the above diagnosis.   Hospital Course: Surgery went very well. Post op pain controlled with PCA. No post op issues otherwises  Laboratory values:  Recent Labs  04/28/13 1559 04/29/13 0350  HGB 13.4 12.1  HCT 39.7 34.8*    Recent Labs  04/29/13 0350  CREATININE 0.76    Disposition: Home  Discharge instruction: The patient was instructed to be ambulatory but told to refrain from heavy lifting, strenuous activity, or driving. Reviewed in detail  Discharge medications:    Medication List    STOP taking these medications       Biotin 7322 MCG Tabs     folic acid 025 MCG tablet  Commonly known as:  FOLVITE     Krill Oil 300 MG Caps     SUPER B COMPLEX PO     Vitamin B-12 5000 MCG Subl     vitamin E 400 UNIT capsule      TAKE these medications       captopril-hydrochlorothiazide 25-25 MG per tablet  Commonly known as:  CAPOZIDE  Take 1 tablet by mouth every morning.     cetirizine 10 MG tablet  Commonly known as:  ZYRTEC  Take 10 mg by mouth daily.     estrogens (conjugated) 0.45 MG tablet  Commonly known as:  PREMARIN  Take 0.45 mg by mouth daily.     HYDROcodone-acetaminophen 5-325 MG per tablet  Commonly known as:  NORCO  Take 1-2 tablets by mouth every 6 (six) hours as needed.     potassium gluconate 595 MG Tabs tablet  Take 595 mg by mouth daily.        Followup:      Follow-up Information   Follow up with Winda Summerall A, MD. (office will call you with appt date and time)    Specialty:  Urology   Contact information:   [facility]  [place] [address] [phone]       Follow up with Reece Packer, MD.   Specialty:  Urology   Contact information:   [facility]  [place] [address] [phone]       Follow up with Drusilla Wampole A, MD. (as scheduled)    Specialty:  Urology   Contact information:   [facility]  [place] [address] [phone]

## 2013-04-29 NOTE — Progress Notes (Signed)
i wrote a note in clinic chart regarding yesterday belly pain Feels great within 15 minutes of assessment yesterday Urine clear No pain Vitals normal Post op discussed See orders

## 2013-04-29 NOTE — Progress Notes (Signed)
Pt voided 200 cc clear yellow urine.  PVR was 100.

## 2013-04-29 NOTE — Discharge Instructions (Signed)
As discussed with Dr. Matilde Sprang.  You may resume aspirin, advil, aleve, vitamins, and supplements 7 days after surgery.  You should purchase colace and Miralax to use per Dr. Mikle Bosworth instructions.   Encourage soft BM for 8 weeks with colace, miralax, etc PRN

## 2015-05-31 LAB — HM COLONOSCOPY

## 2015-07-08 ENCOUNTER — Encounter (HOSPITAL_BASED_OUTPATIENT_CLINIC_OR_DEPARTMENT_OTHER): Payer: Self-pay | Admitting: Emergency Medicine

## 2015-07-08 ENCOUNTER — Emergency Department (HOSPITAL_BASED_OUTPATIENT_CLINIC_OR_DEPARTMENT_OTHER)
Admission: EM | Admit: 2015-07-08 | Discharge: 2015-07-08 | Disposition: A | Discharge status: Home / Self Care | Payer: Medicare Other | Attending: Emergency Medicine | Admitting: Emergency Medicine

## 2015-07-08 ENCOUNTER — Other Ambulatory Visit: Payer: Self-pay

## 2015-07-08 DIAGNOSIS — R55 Syncope and collapse: Secondary | ICD-10-CM | POA: Insufficient documentation

## 2015-07-08 DIAGNOSIS — E860 Dehydration: Secondary | ICD-10-CM | POA: Diagnosis not present

## 2015-07-08 LAB — URINALYSIS, ROUTINE W REFLEX MICROSCOPIC
BILIRUBIN URINE: NEGATIVE
Glucose, UA: NEGATIVE mg/dL
Hgb urine dipstick: NEGATIVE
Ketones, ur: NEGATIVE mg/dL
Leukocytes, UA: NEGATIVE
NITRITE: NEGATIVE
Protein, ur: NEGATIVE mg/dL
Specific Gravity, Urine: 1.01 (ref 1.005–1.030)
pH: 7 (ref 5.0–8.0)

## 2015-07-08 LAB — CBC
HEMATOCRIT: 41.5 % (ref 36.0–46.0)
HEMOGLOBIN: 14 g/dL (ref 12.0–15.0)
MCH: 31.2 pg (ref 26.0–34.0)
MCHC: 33.7 g/dL (ref 30.0–36.0)
MCV: 92.4 fL (ref 78.0–100.0)
Platelets: 200 10*3/uL (ref 150–400)
RBC: 4.49 MIL/uL (ref 3.87–5.11)
RDW: 13.2 % (ref 11.5–15.5)
WBC: 6.1 10*3/uL (ref 4.0–10.5)

## 2015-07-08 LAB — BASIC METABOLIC PANEL
ANION GAP: 9 (ref 5–15)
BUN: 23 mg/dL — ABNORMAL HIGH (ref 6–20)
CALCIUM: 9 mg/dL (ref 8.9–10.3)
CO2: 25 mmol/L (ref 22–32)
Chloride: 103 mmol/L (ref 101–111)
Creatinine, Ser: 1.32 mg/dL — ABNORMAL HIGH (ref 0.44–1.00)
GFR calc Af Amer: 45 mL/min — ABNORMAL LOW (ref >60)
GFR, EST NON AFRICAN AMERICAN: 39 mL/min — AB (ref >60)
GLUCOSE: 113 mg/dL — AB (ref 65–99)
POTASSIUM: 4.3 mmol/L (ref 3.5–5.1)
SODIUM: 137 mmol/L (ref 135–145)

## 2015-07-08 MED ORDER — SODIUM CHLORIDE 0.9 % IV BOLUS (SEPSIS)
1000.0000 mL | Freq: Once | INTRAVENOUS | Status: AC
  Administered 2015-07-08: 1000 mL via INTRAVENOUS

## 2015-07-08 NOTE — ED Notes (Signed)
Pt states she feels perfectly normal now, no dizziness or weakness while ambulating to restroom.

## 2015-07-08 NOTE — ED Notes (Signed)
Pt refuses to change into gown, refuses to remove her dress, states 'I am fine now, I just have a little nausea and I would like something for that." refuses cardiac monitor.

## 2015-07-08 NOTE — ED Notes (Addendum)
Pewr ems: Patient was at a funeral and outside in the heat. Reports that she became very hot inside the area they were in with no air and walked outside. When she walked out side she stepped down and then passed out. Patient reports the her stomach is hurting and she is having some nausea. The patient is a/o x 3 at this time. Per ems the patient became nauseated in the Ambulance and 2 doses of 4 mg of zofran given as well as 250 cc of fluid

## 2015-07-08 NOTE — Discharge Instructions (Signed)
Return here as needed.  Follow-up with her primary care Dr. increase her fluid intake and rest as much as possible

## 2015-07-08 NOTE — ED Provider Notes (Signed)
CSN: [id number]     Arrival date & time 07/08/15  1733 History   First MD Initiated Contact with Patient 07/08/15 1751     Chief Complaint  Patient presents with  . Loss of Consciousness     (Consider location/radiation/quality/duration/timing/severity/associated sxs/prior Treatment) HPI Patient presents to the emergency department on a syncopal episode.  The patient states she was at a funeral in an area that did not have any air-conditioning or fans and she states she was getting very hot.  She states that the temperature outside is also very hot and humid.  She states she did not have anything to eat all day, but had 5 cups of coffee.  The patient states that she started feeling dizzy, so she walked out of the area said down the steps and laid down.  The patient states that she does have some abdominal cramping at this time.  Patient states that she did not take any medications prior to arrivalThe patient denies chest pain, shortness of breath, headache,blurred vision, neck pain, fever, cough, weakness, numbness, anorexia, edema, abdominal pain, nausea, vomiting, diarrhea, rash, back pain, dysuria, hematemesis, bloody stool Past Medical History  Diagnosis Date  . Allergy   . Cataract   . Varicose veins   . Cataract     right eye-not mature   Past Surgical History  Procedure Laterality Date  . Appendectomy    . Abdominal hysterectomy    . Hernia repair Bilateral     inguinal  . Varicose vein surgery Right     stripping  . Cataract extraction Left   . Cystoscopy N/A 04/28/2013    Procedure: CYSTOSCOPY;  Surgeon: Reece Packer, MD;  Location: [facility];  Service: Urology;  Laterality: N/A;  . Anterior and posterior repair N/A 04/28/2013    Procedure: REPAIR CYSTOCELE AND RECTOCELE;  Surgeon: Reece Packer, MD;  Location: [facility];  Service: Urology;  Laterality: N/A;  . Vaginal prolapse repair N/A 04/28/2013    Procedure: VAULT PROLAPSE AND GRAFT  ;  Surgeon: Reece Packer,  MD;  Location: [facility];  Service: Urology;  Laterality: N/A;   Family History  Problem Relation Age of Onset  . Cancer Mother    Social History  Substance Use Topics  . Smoking status: Never Smoker   . Smokeless tobacco: None  . Alcohol Use: No   OB History    No data available     Review of Systems All other systems negative except as documented in the HPI. All pertinent positives and negatives as reviewed in the HPI.   Allergies  Review of patient's allergies indicates no known allergies.  Home Medications   Prior to Admission medications   Medication Sig Start Date End Date Taking? Authorizing Provider  captopril-hydrochlorothiazide (CAPOZIDE) 25-25 MG per tablet Take 1 tablet by mouth every morning.    Historical Provider, MD  cetirizine (ZYRTEC) 10 MG tablet Take 10 mg by mouth daily.    Historical Provider, MD  estrogens, conjugated, (PREMARIN) 0.45 MG tablet Take 0.45 mg by mouth daily.     Historical Provider, MD  HYDROcodone-acetaminophen (NORCO) 5-325 MG per tablet Take 1-2 tablets by mouth every 6 (six) hours as needed. 04/28/13   Debbrah Alar, [name]  potassium gluconate 595 MG TABS tablet Take 595 mg by mouth daily.    Historical Provider, MD   BP 143/69 mmHg  Pulse 74  Temp(Src) 97.6 F (36.4 C) (Oral)  Resp 16  Ht 5' 1.5" (1.562 m)  Wt  56.7 kg  BMI 23.24 kg/m2  SpO2 100% Physical Exam  Constitutional: She is oriented to person, place, and time. She appears well-developed and well-nourished. No distress.  HENT:  Head: Normocephalic and atraumatic.  Mouth/Throat: Oropharynx is clear and moist.  Eyes: Pupils are equal, round, and reactive to light.  Neck: Normal range of motion. Neck supple.  Cardiovascular: Normal rate, regular rhythm and normal heart sounds.  Exam reveals no gallop and no friction rub.   No murmur heard. Pulmonary/Chest: Effort normal and breath sounds normal. No respiratory distress. She has no wheezes.  Abdominal: Soft. Bowel sounds are  normal. She exhibits no distension. There is no tenderness.  Neurological: She is alert and oriented to person, place, and time. She exhibits normal muscle tone. Coordination normal.  Skin: Skin is warm and dry. No rash noted. No erythema.  Psychiatric: She has a normal mood and affect. Her behavior is normal.  Nursing note and vitals reviewed.   ED Course  Procedures (including critical care time) Labs Review Labs Reviewed  BASIC METABOLIC PANEL - Abnormal; Notable for the following:    Glucose, Bld 113 (*)    BUN 23 (*)    Creatinine, Ser 1.32 (*)    GFR calc non Af Amer 39 (*)    GFR calc Af Amer 45 (*)    All other components within normal limits  URINALYSIS, ROUTINE W REFLEX MICROSCOPIC (NOT AT [facility]) - Abnormal; Notable for the following:    APPearance CLOUDY (*)    All other components within normal limits  CBC    Imaging Review No results found. I have personally reviewed and evaluated these images and lab results as part of my medical decision-making.  The patient was given IV fluids and on reassessment she reports feeling dramatically better.  She milligrams any symptoms.  She is able to ambulate without difficulty.  She states that she does feel like eating at this time.  Patient was able tolerate oral fluids.  Patient is advised follow-up with her primary care Dr. told to return as needed.  Patient's vital signs have normalized.  She is advised to make sure that she does have a meal tonight   Dalia Heading, [name] 07/10/15 [place], DO 07/11/15 1512

## 2015-07-08 NOTE — ED Notes (Signed)
Pt now agrees to allow ekg and cardiac monitoring, "but I don't want to take any of my clothes off."

## 2015-07-08 NOTE — ED Notes (Signed)
Pt denys any dizziness, only feels weak.

## 2017-02-07 LAB — HM MAMMOGRAPHY

## 2017-11-15 LAB — HM DEXA SCAN

## 2018-04-02 ENCOUNTER — Encounter (HOSPITAL_COMMUNITY): Payer: Self-pay

## 2018-04-02 ENCOUNTER — Inpatient Hospital Stay (HOSPITAL_COMMUNITY)
Admission: EM | Admit: 2018-04-02 | Discharge: 2018-04-07 | DRG: 872 | Disposition: A | Discharge status: Home / Self Care | Payer: Medicare Other | Attending: Internal Medicine | Admitting: Internal Medicine

## 2018-04-02 ENCOUNTER — Other Ambulatory Visit: Payer: Self-pay

## 2018-04-02 DIAGNOSIS — N183 Chronic kidney disease, stage 3 unspecified: Secondary | ICD-10-CM | POA: Diagnosis present

## 2018-04-02 DIAGNOSIS — Z20822 Contact with and (suspected) exposure to covid-19: Secondary | ICD-10-CM | POA: Diagnosis present

## 2018-04-02 DIAGNOSIS — I129 Hypertensive chronic kidney disease with stage 1 through stage 4 chronic kidney disease, or unspecified chronic kidney disease: Secondary | ICD-10-CM | POA: Diagnosis present

## 2018-04-02 DIAGNOSIS — R748 Abnormal levels of other serum enzymes: Secondary | ICD-10-CM

## 2018-04-02 DIAGNOSIS — J329 Chronic sinusitis, unspecified: Secondary | ICD-10-CM | POA: Diagnosis present

## 2018-04-02 DIAGNOSIS — N12 Tubulo-interstitial nephritis, not specified as acute or chronic: Secondary | ICD-10-CM

## 2018-04-02 DIAGNOSIS — I10 Essential (primary) hypertension: Secondary | ICD-10-CM | POA: Diagnosis present

## 2018-04-02 DIAGNOSIS — N289 Disorder of kidney and ureter, unspecified: Secondary | ICD-10-CM | POA: Diagnosis present

## 2018-04-02 DIAGNOSIS — R509 Fever, unspecified: Secondary | ICD-10-CM | POA: Diagnosis not present

## 2018-04-02 DIAGNOSIS — Z79891 Long term (current) use of opiate analgesic: Secondary | ICD-10-CM

## 2018-04-02 DIAGNOSIS — Z87448 Personal history of other diseases of urinary system: Secondary | ICD-10-CM | POA: Diagnosis present

## 2018-04-02 DIAGNOSIS — R7989 Other specified abnormal findings of blood chemistry: Secondary | ICD-10-CM | POA: Diagnosis present

## 2018-04-02 DIAGNOSIS — Z20828 Contact with and (suspected) exposure to other viral communicable diseases: Secondary | ICD-10-CM

## 2018-04-02 DIAGNOSIS — E876 Hypokalemia: Secondary | ICD-10-CM | POA: Diagnosis present

## 2018-04-02 DIAGNOSIS — A4151 Sepsis due to Escherichia coli [E. coli]: Secondary | ICD-10-CM | POA: Diagnosis not present

## 2018-04-02 DIAGNOSIS — N10 Acute tubulo-interstitial nephritis: Secondary | ICD-10-CM | POA: Diagnosis present

## 2018-04-02 DIAGNOSIS — R6510 Systemic inflammatory response syndrome (SIRS) of non-infectious origin without acute organ dysfunction: Secondary | ICD-10-CM | POA: Diagnosis present

## 2018-04-02 DIAGNOSIS — R6889 Other general symptoms and signs: Secondary | ICD-10-CM

## 2018-04-02 DIAGNOSIS — B9620 Unspecified Escherichia coli [E. coli] as the cause of diseases classified elsewhere: Secondary | ICD-10-CM | POA: Diagnosis present

## 2018-04-02 DIAGNOSIS — N390 Urinary tract infection, site not specified: Secondary | ICD-10-CM

## 2018-04-02 DIAGNOSIS — R740 Nonspecific elevation of levels of transaminase and lactic acid dehydrogenase [LDH]: Secondary | ICD-10-CM | POA: Diagnosis present

## 2018-04-02 DIAGNOSIS — Z79899 Other long term (current) drug therapy: Secondary | ICD-10-CM

## 2018-04-02 DIAGNOSIS — R945 Abnormal results of liver function studies: Secondary | ICD-10-CM

## 2018-04-02 DIAGNOSIS — J069 Acute upper respiratory infection, unspecified: Secondary | ICD-10-CM

## 2018-04-02 NOTE — ED Triage Notes (Signed)
Pt states that she's had a sinus infection for about 5 days, tonight she developed the chills and she had a fever, pt hasn't taken anything prior to coming to the ED

## 2018-04-02 NOTE — ED Triage Notes (Signed)
Pt denies vomiting or diarrhea, pt states she doesn't feel short of breath but with the sinus infection feels like she can't get enough air in

## 2018-04-03 ENCOUNTER — Encounter (HOSPITAL_COMMUNITY): Payer: Self-pay

## 2018-04-03 ENCOUNTER — Emergency Department (HOSPITAL_COMMUNITY): Payer: Medicare Other

## 2018-04-03 DIAGNOSIS — R7989 Other specified abnormal findings of blood chemistry: Secondary | ICD-10-CM | POA: Diagnosis present

## 2018-04-03 DIAGNOSIS — N183 Chronic kidney disease, stage 3 unspecified: Secondary | ICD-10-CM | POA: Diagnosis present

## 2018-04-03 DIAGNOSIS — I129 Hypertensive chronic kidney disease with stage 1 through stage 4 chronic kidney disease, or unspecified chronic kidney disease: Secondary | ICD-10-CM | POA: Diagnosis present

## 2018-04-03 DIAGNOSIS — N10 Acute tubulo-interstitial nephritis: Secondary | ICD-10-CM | POA: Diagnosis not present

## 2018-04-03 DIAGNOSIS — R945 Abnormal results of liver function studies: Secondary | ICD-10-CM

## 2018-04-03 DIAGNOSIS — J069 Acute upper respiratory infection, unspecified: Secondary | ICD-10-CM | POA: Diagnosis present

## 2018-04-03 DIAGNOSIS — J329 Chronic sinusitis, unspecified: Secondary | ICD-10-CM | POA: Diagnosis present

## 2018-04-03 DIAGNOSIS — N12 Tubulo-interstitial nephritis, not specified as acute or chronic: Secondary | ICD-10-CM

## 2018-04-03 DIAGNOSIS — Z87448 Personal history of other diseases of urinary system: Secondary | ICD-10-CM | POA: Diagnosis present

## 2018-04-03 DIAGNOSIS — I10 Essential (primary) hypertension: Secondary | ICD-10-CM | POA: Diagnosis not present

## 2018-04-03 DIAGNOSIS — Z20828 Contact with and (suspected) exposure to other viral communicable diseases: Secondary | ICD-10-CM | POA: Diagnosis not present

## 2018-04-03 DIAGNOSIS — R6510 Systemic inflammatory response syndrome (SIRS) of non-infectious origin without acute organ dysfunction: Secondary | ICD-10-CM | POA: Diagnosis not present

## 2018-04-03 DIAGNOSIS — R740 Nonspecific elevation of levels of transaminase and lactic acid dehydrogenase [LDH]: Secondary | ICD-10-CM | POA: Diagnosis present

## 2018-04-03 DIAGNOSIS — R509 Fever, unspecified: Secondary | ICD-10-CM | POA: Diagnosis present

## 2018-04-03 DIAGNOSIS — A4151 Sepsis due to Escherichia coli [E. coli]: Secondary | ICD-10-CM | POA: Diagnosis present

## 2018-04-03 DIAGNOSIS — Z79891 Long term (current) use of opiate analgesic: Secondary | ICD-10-CM | POA: Diagnosis not present

## 2018-04-03 DIAGNOSIS — Z79899 Other long term (current) drug therapy: Secondary | ICD-10-CM | POA: Diagnosis not present

## 2018-04-03 DIAGNOSIS — E876 Hypokalemia: Secondary | ICD-10-CM | POA: Diagnosis present

## 2018-04-03 DIAGNOSIS — N289 Disorder of kidney and ureter, unspecified: Secondary | ICD-10-CM | POA: Diagnosis present

## 2018-04-03 HISTORY — DX: Other specified abnormal findings of blood chemistry: R79.89

## 2018-04-03 LAB — CBC WITH DIFFERENTIAL/PLATELET
Abs Immature Granulocytes: 0.13 10*3/uL — ABNORMAL HIGH (ref 0.00–0.07)
Abs Immature Granulocytes: 0.08 10*3/uL — ABNORMAL HIGH (ref 0.00–0.07)
BASOS ABS: 0 10*3/uL (ref 0.0–0.1)
BASOS PCT: 0 %
Basophils Absolute: 0 10*3/uL (ref 0.0–0.1)
Basophils Relative: 0 %
Eosinophils Absolute: 0.1 10*3/uL (ref 0.0–0.5)
Eosinophils Absolute: 0.1 10*3/uL (ref 0.0–0.5)
Eosinophils Relative: 1 %
Eosinophils Relative: 1 %
HCT: 39.3 % (ref 36.0–46.0)
HCT: 41.1 % (ref 36.0–46.0)
Hemoglobin: 12.9 g/dL (ref 12.0–15.0)
Hemoglobin: 13.1 g/dL (ref 12.0–15.0)
IMMATURE GRANULOCYTES: 1 %
Immature Granulocytes: 1 %
Lymphocytes Relative: 11 %
Lymphocytes Relative: 13 %
Lymphs Abs: 1 10*3/uL (ref 0.7–4.0)
Lymphs Abs: 1.2 10*3/uL (ref 0.7–4.0)
MCH: 29.9 pg (ref 26.0–34.0)
MCH: 29.6 pg (ref 26.0–34.0)
MCHC: 32.8 g/dL (ref 30.0–36.0)
MCHC: 31.9 g/dL (ref 30.0–36.0)
MCV: 91 fL (ref 80.0–100.0)
MCV: 93 fL (ref 80.0–100.0)
Monocytes Absolute: 0.7 10*3/uL (ref 0.1–1.0)
Monocytes Absolute: 0.6 10*3/uL (ref 0.1–1.0)
Monocytes Relative: 7 %
Monocytes Relative: 7 %
NEUTROS PCT: 80 %
NRBC: 0 % (ref 0.0–0.2)
Neutro Abs: 7.4 10*3/uL (ref 1.7–7.7)
Neutro Abs: 7.5 10*3/uL (ref 1.7–7.7)
Neutrophils Relative %: 78 %
PLATELETS: 172 10*3/uL (ref 150–400)
Platelets: 183 10*3/uL (ref 150–400)
RBC: 4.32 MIL/uL (ref 3.87–5.11)
RBC: 4.42 MIL/uL (ref 3.87–5.11)
RDW: 12.8 % (ref 11.5–15.5)
RDW: 12.9 % (ref 11.5–15.5)
WBC: 9.3 10*3/uL (ref 4.0–10.5)
WBC: 9.4 10*3/uL (ref 4.0–10.5)
nRBC: 0 % (ref 0.0–0.2)

## 2018-04-03 LAB — URINALYSIS, ROUTINE W REFLEX MICROSCOPIC
BILIRUBIN URINE: NEGATIVE
Glucose, UA: NEGATIVE mg/dL
KETONES UR: NEGATIVE mg/dL
NITRITE: POSITIVE — AB
PROTEIN: 100 mg/dL — AB
Specific Gravity, Urine: 1.021 (ref 1.005–1.030)
pH: 5 (ref 5.0–8.0)

## 2018-04-03 LAB — LACTIC ACID, PLASMA
Lactic Acid, Venous: 0.8 mmol/L (ref 0.5–1.9)
Lactic Acid, Venous: 0.9 mmol/L (ref 0.5–1.9)
Lactic Acid, Venous: 0.9 mmol/L (ref 0.5–1.9)
Lactic Acid, Venous: 1.3 mmol/L (ref 0.5–1.9)

## 2018-04-03 LAB — RESPIRATORY PANEL BY PCR
Adenovirus: NOT DETECTED
Bordetella pertussis: NOT DETECTED
CORONAVIRUS 229E-RVPPCR: NOT DETECTED
Chlamydophila pneumoniae: NOT DETECTED
Coronavirus HKU1: NOT DETECTED
Coronavirus NL63: DETECTED — AB
Coronavirus OC43: NOT DETECTED
Influenza A: NOT DETECTED
Influenza B: NOT DETECTED
Metapneumovirus: NOT DETECTED
Mycoplasma pneumoniae: NOT DETECTED
Parainfluenza Virus 1: NOT DETECTED
Parainfluenza Virus 2: NOT DETECTED
Parainfluenza Virus 3: NOT DETECTED
Parainfluenza Virus 4: NOT DETECTED
Respiratory Syncytial Virus: NOT DETECTED
Rhinovirus / Enterovirus: NOT DETECTED

## 2018-04-03 LAB — COMPREHENSIVE METABOLIC PANEL
ALT: 219 U/L — ABNORMAL HIGH (ref 0–44)
ANION GAP: 9 (ref 5–15)
AST: 377 U/L — ABNORMAL HIGH (ref 15–41)
Albumin: 3.2 g/dL — ABNORMAL LOW (ref 3.5–5.0)
Alkaline Phosphatase: 238 U/L — ABNORMAL HIGH (ref 38–126)
BILIRUBIN TOTAL: 0.5 mg/dL (ref 0.3–1.2)
BUN: 27 mg/dL — ABNORMAL HIGH (ref 8–23)
CO2: 21 mmol/L — AB (ref 22–32)
Calcium: 8.7 mg/dL — ABNORMAL LOW (ref 8.9–10.3)
Chloride: 110 mmol/L (ref 98–111)
Creatinine, Ser: 1.19 mg/dL — ABNORMAL HIGH (ref 0.44–1.00)
GFR calc non Af Amer: 44 mL/min — ABNORMAL LOW (ref >60)
GFR, EST AFRICAN AMERICAN: 51 mL/min — AB (ref >60)
Glucose, Bld: 130 mg/dL — ABNORMAL HIGH (ref 70–99)
Potassium: 3.2 mmol/L — ABNORMAL LOW (ref 3.5–5.1)
SODIUM: 140 mmol/L (ref 135–145)
TOTAL PROTEIN: 7.2 g/dL (ref 6.5–8.1)

## 2018-04-03 LAB — BASIC METABOLIC PANEL
Anion gap: 7 (ref 5–15)
BUN: 19 mg/dL (ref 8–23)
CO2: 21 mmol/L — AB (ref 22–32)
CREATININE: 1.02 mg/dL — AB (ref 0.44–1.00)
Calcium: 8.5 mg/dL — ABNORMAL LOW (ref 8.9–10.3)
Chloride: 115 mmol/L — ABNORMAL HIGH (ref 98–111)
GFR calc Af Amer: >60 mL/min (ref >60)
GFR calc non Af Amer: 53 mL/min — ABNORMAL LOW (ref >60)
Glucose, Bld: 107 mg/dL — ABNORMAL HIGH (ref 70–99)
Potassium: 3 mmol/L — ABNORMAL LOW (ref 3.5–5.1)
Sodium: 143 mmol/L (ref 135–145)

## 2018-04-03 LAB — APTT: aPTT: 39 seconds — ABNORMAL HIGH (ref 24–36)

## 2018-04-03 LAB — MAGNESIUM: Magnesium: 1.8 mg/dL (ref 1.7–2.4)

## 2018-04-03 LAB — ACETAMINOPHEN LEVEL: Acetaminophen (Tylenol), Serum: <10 ug/mL — ABNORMAL LOW (ref 10–30)

## 2018-04-03 LAB — PROCALCITONIN: Procalcitonin: 0.58 ng/mL

## 2018-04-03 LAB — PROTIME-INR
INR: 1.1 (ref 0.8–1.2)
PROTHROMBIN TIME: 13.9 s (ref 11.4–15.2)

## 2018-04-03 LAB — INFLUENZA PANEL BY PCR (TYPE A & B)
Influenza A By PCR: NEGATIVE
Influenza B By PCR: NEGATIVE

## 2018-04-03 LAB — TYPE AND SCREEN
ABO/RH(D): O POS
Antibody Screen: NEGATIVE

## 2018-04-03 LAB — MRSA PCR SCREENING: MRSA by PCR: NEGATIVE

## 2018-04-03 MED ORDER — SODIUM CHLORIDE 0.9 % IV SOLN
INTRAVENOUS | Status: AC
  Administered 2018-04-03: 09:00:00 via INTRAVENOUS

## 2018-04-03 MED ORDER — VANCOMYCIN HCL IN DEXTROSE 1-5 GM/200ML-% IV SOLN
1000.0000 mg | Freq: Once | INTRAVENOUS | Status: AC
  Administered 2018-04-03: 1000 mg via INTRAVENOUS
  Filled 2018-04-03: qty 200

## 2018-04-03 MED ORDER — SODIUM CHLORIDE 0.9 % IV SOLN
2.0000 g | INTRAVENOUS | Status: DC
  Administered 2018-04-03: 2 g via INTRAVENOUS
  Filled 2018-04-03: qty 2

## 2018-04-03 MED ORDER — METRONIDAZOLE IN NACL 5-0.79 MG/ML-% IV SOLN
500.0000 mg | Freq: Once | INTRAVENOUS | Status: AC
  Administered 2018-04-03: 500 mg via INTRAVENOUS
  Filled 2018-04-03: qty 100

## 2018-04-03 MED ORDER — ONDANSETRON HCL 4 MG PO TABS: 4.0000 mg | ORAL_TABLET | Freq: Four times a day (QID) | ORAL | Status: DC | PRN

## 2018-04-03 MED ORDER — SODIUM CHLORIDE 0.9 % IV BOLUS (SEPSIS)
250.0000 mL | Freq: Once | INTRAVENOUS | Status: AC
  Administered 2018-04-03: 1000 mL via INTRAVENOUS

## 2018-04-03 MED ORDER — SODIUM CHLORIDE 0.9 % IV BOLUS (SEPSIS)
500.0000 mL | Freq: Once | INTRAVENOUS | Status: AC
  Administered 2018-04-03: 500 mL via INTRAVENOUS

## 2018-04-03 MED ORDER — IOPAMIDOL (ISOVUE-300) INJECTION 61%
INTRAVENOUS | Status: AC
  Filled 2018-04-03: qty 100

## 2018-04-03 MED ORDER — AMLODIPINE BESYLATE 5 MG PO TABS
5.0000 mg | ORAL_TABLET | Freq: Every day | ORAL | Status: DC
  Administered 2018-04-03 – 2018-04-07 (×5): 5 mg via ORAL
  Filled 2018-04-03 (×5): qty 1

## 2018-04-03 MED ORDER — SODIUM CHLORIDE 0.9 % IV BOLUS (SEPSIS)
1000.0000 mL | Freq: Once | INTRAVENOUS | Status: AC
  Administered 2018-04-03: 1000 mL via INTRAVENOUS

## 2018-04-03 MED ORDER — ACETAMINOPHEN 325 MG PO TABS: 650.0000 mg | ORAL_TABLET | ORAL | Status: DC | PRN

## 2018-04-03 MED ORDER — SODIUM CHLORIDE (PF) 0.9 % IJ SOLN
INTRAMUSCULAR | Status: AC
  Filled 2018-04-03: qty 50

## 2018-04-03 MED ORDER — ENOXAPARIN SODIUM 40 MG/0.4ML ~~LOC~~ SOLN: 40.0000 mg | SUBCUTANEOUS | Status: DC

## 2018-04-03 MED ORDER — POTASSIUM CHLORIDE CRYS ER 20 MEQ PO TBCR
40.0000 meq | EXTENDED_RELEASE_TABLET | ORAL | Status: AC
  Administered 2018-04-03 (×2): 40 meq via ORAL
  Filled 2018-04-03 (×2): qty 2

## 2018-04-03 MED ORDER — ONDANSETRON HCL 4 MG/2ML IJ SOLN: 4.0000 mg | Freq: Four times a day (QID) | INTRAMUSCULAR | Status: DC | PRN

## 2018-04-03 MED ORDER — SODIUM CHLORIDE 0.9 % IV SOLN
2.0000 g | Freq: Once | INTRAVENOUS | Status: AC
  Administered 2018-04-03: 2 g via INTRAVENOUS
  Filled 2018-04-03: qty 2

## 2018-04-03 MED ORDER — ACETAMINOPHEN 325 MG PO TABS
650.0000 mg | ORAL_TABLET | Freq: Once | ORAL | Status: DC
  Filled 2018-04-03: qty 2

## 2018-04-03 MED ORDER — MAGNESIUM SULFATE 4 GM/100ML IV SOLN
4.0000 g | Freq: Once | INTRAVENOUS | Status: AC
  Administered 2018-04-03: 4 g via INTRAVENOUS
  Filled 2018-04-03: qty 100

## 2018-04-03 MED ORDER — IOPAMIDOL (ISOVUE-300) INJECTION 61%
100.0000 mL | Freq: Once | INTRAVENOUS | Status: AC | PRN
  Administered 2018-04-03: 80 mL via INTRAVENOUS

## 2018-04-03 NOTE — ED Notes (Signed)
Pure wick placed.

## 2018-04-03 NOTE — Progress Notes (Signed)
I have seen and assessed patient and agree with Dr. Moise Boring assessment and plan.  Patient is 78 year old female presented to the ED after not feeling well with some nausea and questionable upset stomach.  Patient has been having symptoms of sinusitis with nasal congestion and nasal drainage over the past week.  Patient noted to have symptoms of upper respiratory tract infection noted to have spiked a fever with a temp of 101.2 as well as with chills.  Patient presented to the ED.  Lab work done showed a transaminitis and urinalysis consistent with a UTI.  CT abdomen and pelvis was concerning for right-sided pyelonephritis and multiple lesions on both kidneys likely cysts.  Due to patient's fever chills recent sinusitis and respiratory symptoms in the setting of current pandemic of  COVID-19 patient RSV panel was ordered.  Flu panel was negative.  COVID- 19 ordered and patient placed in the stepdown unit.  Patient placed empirically on IV Rocephin, supportive care.  No charge.

## 2018-04-03 NOTE — H&P (Signed)
History and Physical    Abigail Acosta DGL:[id number] DOB: 1940/09/29 DOA: 04/02/2018  PCP: System, Pcp Not In  Patient coming from: Home.  Chief Complaint: Upset stomach.  HPI: Abigail Acosta is a 78 y.o. female with history of hypertension presents to the ER after patient was not feeling well and felt nauseated with upset stomach.  Patient states over the last 1 week patient has been having sinusitis symptoms with draining nose.  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  Denies any chest pain shortness of breath abdominal pain vomiting or diarrhea has been having nausea since last evening.  Since patient has been having upper respiratory tract symptoms patient has been taking ibuprofen and Tylenol.  Usually one Tylenol and 2 ibuprofen's per day for last 3 days.  ED Course: In the ER patient was found to be febrile with temperature of 101.2.  Labs show elevated LFTs with AST 377 alkaline phosphatase 238 ALT 219 total bilirubin was normal.  Lactate was normal.  WBC count was 9.3 hemoglobin 12.9 and platelets 130 2K UA showing normal sinus rhythm.  Days consistent with UTI.  Creatinine is 1.1.  Cute.  CT abdomen pelvis liver.  Shows features concerning for right-sided pyelonephritis and multiple lesions in the both kidneys which are not certain.  Patient started on antibiotics.  Since patient has fever chills with upper respiratory tract symptoms and flu panel being negative coronavirus COVID 19 panel was sent.  Respiratory viral panel is pending.  Review of Systems: As per HPI, rest all negative.   Past Medical History:  Diagnosis Date   Allergy    Cataract    Cataract    right eye-not mature   Varicose veins     Past Surgical History:  Procedure Laterality Date   ABDOMINAL HYSTERECTOMY     ANTERIOR AND POSTERIOR REPAIR N/A 04/28/2013   Procedure: REPAIR CYSTOCELE AND RECTOCELE;  Surgeon: Reece Packer, MD;   Location: [facility];  Service: Urology;  Laterality: N/A;   APPENDECTOMY     CATARACT EXTRACTION Left    CYSTOSCOPY N/A 04/28/2013   Procedure: CYSTOSCOPY;  Surgeon: Reece Packer, MD;  Location: [facility];  Service: Urology;  Laterality: N/A;   HERNIA REPAIR Bilateral    inguinal   VAGINAL PROLAPSE REPAIR N/A 04/28/2013   Procedure: VAULT PROLAPSE AND GRAFT  ;  Surgeon: Reece Packer, MD;  Location: [facility];  Service: Urology;  Laterality: N/A;   VARICOSE VEIN SURGERY Right    stripping     reports that she has never smoked. She has never used smokeless tobacco. She reports that she does not drink alcohol or use drugs.  No Known Allergies  Family History  Problem Relation Age of Onset   Cancer Mother     Prior to Admission medications   Medication Sig Start Date End Date Taking? Authorizing Provider  BIOTIN PO Take 1 tablet by mouth daily.   Yes [provider]  captopril-hydrochlorothiazide (CAPOZIDE) 25-25 MG per tablet Take 0.5 tablets by mouth every morning.    Yes [provider]  cetirizine (ZYRTEC) 10 MG tablet Take 10 mg by mouth daily as needed for allergies.    Yes [provider]  sodium chloride (OCEAN) 0.65 % SOLN nasal spray Place 1 spray into both nostrils as needed for congestion.   Yes [provider]  HYDROcodone-acetaminophen (NORCO) 5-325 MG per tablet Take 1-2 tablets by mouth  every 6 (six) hours as needed. Patient not taking: Reported on 04/03/2018 04/28/13   Debbrah Alar, [name]    Physical Exam: Vitals:   04/03/18 0300 04/03/18 0400 04/03/18 0430 04/03/18 0520  BP: (!) 143/65 (!) 147/61 (!) 160/64 (!) 161/65  Pulse: 75 79 68 70  Resp: 16 17 18 16   Temp:      TempSrc:      SpO2: 98% 100% 97% 98%  Weight:      Height:          Constitutional: Moderately built and nourished. Vitals:   04/03/18 0300 04/03/18 0400 04/03/18 0430 04/03/18 0520  BP: (!) 143/65 (!) 147/61 (!) 160/64 (!) 161/65  Pulse: 75 79 68  70  Resp: 16 17 18 16   Temp:      TempSrc:      SpO2: 98% 100% 97% 98%  Weight:      Height:       Eyes: Anicteric no pallor. ENMT: No discharge from the ears eyes nose and mouth. Neck: No mass felt.  No neck rigidity. Respiratory: No rhonchi or crepitations. Cardiovascular: S1-S2 heard. Abdomen: Soft nontender bowel sounds present.  No guarding no rigidity no rebound tenderness. Musculoskeletal: No edema. Skin: No rash. Neurologic: Alert awake oriented to time place and person.  Moves all extremities. Psychiatric: Appears normal.   Labs on Admission: I have personally reviewed following labs and imaging studies  CBC: Recent Labs  Lab 04/03/18 0042  WBC 9.3  NEUTROABS 7.4  HGB 12.9  HCT 39.3  MCV 91.0  PLT 443   Basic Metabolic Panel: Recent Labs  Lab 04/03/18 0042  NA 140  K 3.2*  CL 110  CO2 21*  GLUCOSE 130*  BUN 27*  CREATININE 1.19*  CALCIUM 8.7*   GFR: Estimated Creatinine Clearance: 33.3 mL/min (A) (by C-G formula based on SCr of 1.19 mg/dL (H)). Liver Function Tests: Recent Labs  Lab 04/03/18 0042  AST 377*  ALT 219*  ALKPHOS 238*  BILITOT 0.5  PROT 7.2  ALBUMIN 3.2*   No results for input(s): LIPASE, AMYLASE in the last 168 hours. No results for input(s): AMMONIA in the last 168 hours. Coagulation Profile: No results for input(s): INR, PROTIME in the last 168 hours. Cardiac Enzymes: No results for input(s): CKTOTAL, CKMB, CKMBINDEX, TROPONINI in the last 168 hours. BNP (last 3 results) No results for input(s): PROBNP in the last 8760 hours. HbA1C: No results for input(s): HGBA1C in the last 72 hours. CBG: No results for input(s): GLUCAP in the last 168 hours. Lipid Profile: No results for input(s): CHOL, HDL, LDLCALC, TRIG, CHOLHDL, LDLDIRECT in the last 72 hours. Thyroid Function Tests: No results for input(s): TSH, T4TOTAL, FREET4, T3FREE, THYROIDAB in the last 72 hours. Anemia Panel: No results for input(s): VITAMINB12, FOLATE,  FERRITIN, TIBC, IRON, RETICCTPCT in the last 72 hours. Urine analysis:    Component Value Date/Time   COLORURINE YELLOW 04/03/2018 0029   APPEARANCEUR CLOUDY (A) 04/03/2018 0029   LABSPEC 1.021 04/03/2018 0029   PHURINE 5.0 04/03/2018 0029   GLUCOSEU NEGATIVE 04/03/2018 0029   HGBUR LARGE (A) 04/03/2018 0029   BILIRUBINUR NEGATIVE 04/03/2018 0029   KETONESUR NEGATIVE 04/03/2018 0029   PROTEINUR 100 (A) 04/03/2018 0029   NITRITE POSITIVE (A) 04/03/2018 0029   LEUKOCYTESUR LARGE (A) 04/03/2018 0029   Sepsis Labs: @LABRCNTIP (procalcitonin:4,lacticidven:4) )No results found for this or any previous visit (from the past 240 hour(s)).   Radiological Exams on Admission: Ct Abdomen Pelvis W Contrast  Result Date:  04/03/2018 CLINICAL DATA:  Fever, chills, UTI, transaminitis, wbc's 9.3, GFR 44, rbc's and wbc's in urine, hx of vaginal prolapse repair, bilateral inguinal hernia repair, cystoscopy, appendectomy, rectocele and cystocele repair, and hysterectomy, no prev ct a/p [facility], prev ct a/p [facility] 03/12/16 EXAM: CT ABDOMEN AND PELVIS WITH CONTRAST TECHNIQUE: Multidetector CT imaging of the abdomen and pelvis was performed using the standard protocol following bolus administration of intravenous contrast. CONTRAST:  52mL ISOVUE-300 IOPAMIDOL (ISOVUE-300) INJECTION 61% COMPARISON:  None. FINDINGS: Lower chest: No acute abnormality. Hepatobiliary: Liver borderline enlarged. No masses or focal lesions. Normal gallbladder. No bile duct dilation. Pancreas: Unremarkable. No pancreatic ductal dilatation or surrounding inflammatory changes. Spleen: Normal in size without focal abnormality. Adrenals/Urinary Tract: No adrenal masses. Multiple small low-attenuation renal masses, too small to fully characterize, likely cysts. Fat density mass, posterior lower pole the right kidney, 1.8 cm, consistent with an angiomyolipoma. Subtle relative decreased attenuation in the upper pole the right kidney best seen on  the delayed view. There is hazy perinephric stranding on the right. There is mild thickening of the posterior pararenal fascia. No perirenal fluid collection. There are no renal stones. No hydronephrosis. Ureters are normal in course and in caliber. Bladder is unremarkable. Stomach/Bowel: Stomach is unremarkable. Small bowel and colon are normal in caliber. No wall thickening or inflammation. Vascular/Lymphatic: Mild aortic atherosclerosis. No aneurysm. No other vascular abnormality. No enlarged lymph nodes. Reproductive: Status post hysterectomy. No adnexal masses. Other: No ascites.  No abdominal wall hernia. Musculoskeletal: No fracture or acute finding. No osteoblastic or osteolytic lesions. IMPRESSION: 1. There is right perinephric haziness consistent with inflammation. Subtle area of relative hypoattenuation noted in the upper pole the right kidney. These findings are consistent with right-sided pyelonephritis. No evidence of an abscess. No hydronephrosis or evidence of obstructive uropathy. 2. Multiple low-density renal lesions seen bilaterally that are too small to fully characterize but are likely cysts. Right lower pole renal angiomyolipoma. 3. No other acute findings. 4. Mild aortic atherosclerosis. Electronically Signed   By: Lajean Manes M.D.   On: 04/03/2018 03:55   Dg Chest Portable 1 View  Result Date: 04/03/2018 CLINICAL DATA:  Sinus infection and cough for 5 days. Fever tonight. EXAM: PORTABLE CHEST 1 VIEW COMPARISON:  04/16/2013 FINDINGS: Cardiac silhouette is normal in size. No mediastinal or hilar masses. There is no evidence of adenopathy. Clear lungs.  No pleural effusion or pneumothorax. Skeletal structures are grossly intact. IMPRESSION: No active disease. Electronically Signed   By: Lajean Manes M.D.   On: 04/03/2018 00:17    EKG: Independently reviewed.  Normal sinus rhythm.  Assessment/Plan Principal Problem:   SIRS (systemic inflammatory response syndrome) (HCC) Active  Problems:   Acute pyelonephritis   Elevated LFTs   CKD (chronic kidney disease) stage 3, GFR 30-59 ml/min (HCC)   Essential hypertension    1. SIRS possible developing sepsis from pyelonephritis for which patient is placed on ceftriaxone follow urine culture blood cultures procalcitonin lactate continue with gentle hydration. 2. Upper respiratory tract symptoms -since there is epidemic of coronavirus COVID 19 test have been sent for this.  Low risk physiology.  We will keep patient on droplet and contact precaution.  In addition we will check respiratory viral panel heterophile antibodies to make sure there is no EBV infection given the elevated LFTs. 3. Elevated LFTs check acute hepatitis panel neutrophil antibodies Tylenol level INR and also follow-up coronavirus test. 4. Hypertension we will keep patient on PRN IV hydralazine for now.  Hold ACE inhibitor and diuretics. 5. Chronic kidney disease stage III creatinine appears to be at baseline.   DVT prophylaxis: SCDs.  Since LFTs elevated. Code Status: Full code. Family Communication: Patient's husband. Disposition Plan: Home. Consults called: None. Admission status: Inpatient.   Rise Patience MD [facility] Pager [phone].  If 7PM-7AM, please contact night-coverage [email or web address] Password [email or web address]  04/03/2018, 7:00 AM

## 2018-04-03 NOTE — ED Provider Notes (Signed)
[facility] DEPT Provider Note   CSN: [id number] Arrival date & time: 04/02/18  2259    History   Chief Complaint Chief Complaint  Patient presents with  . Fever    HPI Abigail Acosta is a 78 y.o. female.     Patient to the ED with complaint of rigors and fever that started tonight. She reports maxillary sinus pressure/pain, postnasal drip, sore throat and cough x 1 week. She reports she has had significant sinus problems in the past with these same symptoms. She was unaware of any fever until tonight when she started having severe chills in the evening, shaking, mild nausea. She took her temperature and it was 101.2. She denies any known sick contacts with febrile respiratory illness and has had no recent travel. Her and her husband have been minimizing their exposure to public places. No abdominal pain, vomiting, diarrhea, urinary symptoms.   The history is provided by the patient and the spouse. No language interpreter was used.  Fever  Associated symptoms: congestion, cough, nausea and sore throat   Associated symptoms: no chest pain, no dysuria, no headaches, no myalgias, no rash and no vomiting     Past Medical History:  Diagnosis Date  . Allergy   . Cataract   . Cataract    right eye-not mature  . Varicose veins     Patient Active Problem List   Diagnosis Date Noted  . Cystocele 04/28/2013    Past Surgical History:  Procedure Laterality Date  . ABDOMINAL HYSTERECTOMY    . ANTERIOR AND POSTERIOR REPAIR N/A 04/28/2013   Procedure: REPAIR CYSTOCELE AND RECTOCELE;  Surgeon: Reece Packer, MD;  Location: [facility];  Service: Urology;  Laterality: N/A;  . APPENDECTOMY    . CATARACT EXTRACTION Left   . CYSTOSCOPY N/A 04/28/2013   Procedure: CYSTOSCOPY;  Surgeon: Reece Packer, MD;  Location: [facility];  Service: Urology;  Laterality: N/A;  . HERNIA REPAIR Bilateral    inguinal  . VAGINAL PROLAPSE REPAIR N/A 04/28/2013   Procedure:  VAULT PROLAPSE AND GRAFT  ;  Surgeon: Reece Packer, MD;  Location: [facility];  Service: Urology;  Laterality: N/A;  . VARICOSE VEIN SURGERY Right    stripping     OB History   No obstetric history on file.      Home Medications    Prior to Admission medications   Medication Sig Start Date End Date Taking? Authorizing Provider  BIOTIN PO Take 1 tablet by mouth daily.   Yes [provider]  captopril-hydrochlorothiazide (CAPOZIDE) 25-25 MG per tablet Take 0.5 tablets by mouth every morning.    Yes [provider]  cetirizine (ZYRTEC) 10 MG tablet Take 10 mg by mouth daily as needed for allergies.    Yes [provider]  sodium chloride (OCEAN) 0.65 % SOLN nasal spray Place 1 spray into both nostrils as needed for congestion.   Yes [provider]  HYDROcodone-acetaminophen (NORCO) 5-325 MG per tablet Take 1-2 tablets by mouth every 6 (six) hours as needed. Patient not taking: Reported on 04/03/2018 04/28/13   Debbrah Alar, [name]    Family History Family History  Problem Relation Age of Onset  . Cancer Mother     Social History Social History   Tobacco Use  . Smoking status: Never Smoker  . Smokeless tobacco: Never Used  Substance Use Topics  . Alcohol use: No  . Drug use: No     Allergies   Patient has  no known allergies.   Review of Systems Review of Systems  Constitutional: Positive for fever.  HENT: Positive for congestion and sore throat.   Respiratory: Positive for cough and shortness of breath.   Cardiovascular: Negative for chest pain.  Gastrointestinal: Positive for nausea. Negative for abdominal pain and vomiting.  Genitourinary: Negative for dysuria and frequency.  Musculoskeletal: Negative for myalgias.  Skin: Negative for rash.  Neurological: Negative for headaches.     Physical Exam Updated Vital Signs BP (!) 141/61 (BP Location: Right Arm)   Pulse 76   Temp 98.2 F (36.8 C) (Oral)   Resp 20   Ht '5\' 3"'$   (1.6 m)   Wt 56.7 kg   SpO2 99%   BMI 22.14 kg/m   Physical Exam Vitals signs and nursing note reviewed.  Constitutional:      Appearance: She is well-developed. She is not toxic-appearing.  HENT:     Head: Normocephalic.     Nose: Nose normal.     Mouth/Throat:     Mouth: Mucous membranes are dry.  Eyes:     Conjunctiva/sclera: Conjunctivae normal.  Neck:     Musculoskeletal: Normal range of motion and neck supple.  Cardiovascular:     Rate and Rhythm: Regular rhythm. Tachycardia present.     Heart sounds: No murmur.  Pulmonary:     Effort: Pulmonary effort is normal.     Breath sounds: Normal breath sounds. No wheezing, rhonchi or rales.  Abdominal:     General: Bowel sounds are normal.     Palpations: Abdomen is soft.     Tenderness: There is no abdominal tenderness. There is no guarding or rebound.  Musculoskeletal: Normal range of motion.  Skin:    General: Skin is warm and dry.     Findings: No rash.  Neurological:     Mental Status: She is alert and oriented to person, place, and time.      ED Treatments / Results  Labs (all labs ordered are listed, but only abnormal results are displayed) Labs Reviewed  COMPREHENSIVE METABOLIC PANEL - Abnormal; Notable for the following components:      Result Value   Potassium 3.2 (*)    CO2 21 (*)    Glucose, Bld 130 (*)    BUN 27 (*)    Creatinine, Ser 1.19 (*)    Calcium 8.7 (*)    Albumin 3.2 (*)    AST 377 (*)    ALT 219 (*)    Alkaline Phosphatase 238 (*)    GFR calc non Af Amer 44 (*)    GFR calc Af Amer 51 (*)    All other components within normal limits  CBC WITH DIFFERENTIAL/PLATELET - Abnormal; Notable for the following components:   Abs Immature Granulocytes 0.13 (*)    All other components within normal limits  URINALYSIS, ROUTINE W REFLEX MICROSCOPIC - Abnormal; Notable for the following components:   APPearance CLOUDY (*)    Hgb urine dipstick LARGE (*)    Protein, ur 100 (*)    Nitrite POSITIVE  (*)    Leukocytes,Ua LARGE (*)    RBC / HPF >50 (*)    WBC, UA >50 (*)    Bacteria, UA MANY (*)    All other components within normal limits  CULTURE, BLOOD (ROUTINE X 2)  CULTURE, BLOOD (ROUTINE X 2)  URINE CULTURE  LACTIC ACID, PLASMA  INFLUENZA PANEL BY PCR (TYPE A & B)  LACTIC ACID, PLASMA   Results for  orders placed or performed during the hospital encounter of 04/02/18  Lactic acid, plasma  Result Value Ref Range   Lactic Acid, Venous 0.8 0.5 - 1.9 mmol/L  Lactic acid, plasma  Result Value Ref Range   Lactic Acid, Venous 0.9 0.5 - 1.9 mmol/L  Comprehensive metabolic panel  Result Value Ref Range   Sodium 140 135 - 145 mmol/L   Potassium 3.2 (L) 3.5 - 5.1 mmol/L   Chloride 110 98 - 111 mmol/L   CO2 21 (L) 22 - 32 mmol/L   Glucose, Bld 130 (H) 70 - 99 mg/dL   BUN 27 (H) 8 - 23 mg/dL   Creatinine, Ser 1.19 (H) 0.44 - 1.00 mg/dL   Calcium 8.7 (L) 8.9 - 10.3 mg/dL   Total Protein 7.2 6.5 - 8.1 g/dL   Albumin 3.2 (L) 3.5 - 5.0 g/dL   AST 377 (H) 15 - 41 U/L   ALT 219 (H) 0 - 44 U/L   Alkaline Phosphatase 238 (H) 38 - 126 U/L   Total Bilirubin 0.5 0.3 - 1.2 mg/dL   GFR calc non Af Amer 44 (L) >60 mL/min   GFR calc Af Amer 51 (L) >60 mL/min   Anion gap 9 5 - 15  CBC with Differential  Result Value Ref Range   WBC 9.3 4.0 - 10.5 K/uL   RBC 4.32 3.87 - 5.11 MIL/uL   Hemoglobin 12.9 12.0 - 15.0 g/dL   HCT 39.3 36.0 - 46.0 %   MCV 91.0 80.0 - 100.0 fL   MCH 29.9 26.0 - 34.0 pg   MCHC 32.8 30.0 - 36.0 g/dL   RDW 12.8 11.5 - 15.5 %   Platelets 172 150 - 400 K/uL   nRBC 0.0 0.0 - 0.2 %   Neutrophils Relative % 80 %   Neutro Abs 7.4 1.7 - 7.7 K/uL   Lymphocytes Relative 11 %   Lymphs Abs 1.0 0.7 - 4.0 K/uL   Monocytes Relative 7 %   Monocytes Absolute 0.7 0.1 - 1.0 K/uL   Eosinophils Relative 1 %   Eosinophils Absolute 0.1 0.0 - 0.5 K/uL   Basophils Relative 0 %   Basophils Absolute 0.0 0.0 - 0.1 K/uL   Immature Granulocytes 1 %   Abs Immature Granulocytes 0.13 (H)  0.00 - 0.07 K/uL  Urinalysis, Routine w reflex microscopic  Result Value Ref Range   Color, Urine YELLOW YELLOW   APPearance CLOUDY (A) CLEAR   Specific Gravity, Urine 1.021 1.005 - 1.030   pH 5.0 5.0 - 8.0   Glucose, UA NEGATIVE NEGATIVE mg/dL   Hgb urine dipstick LARGE (A) NEGATIVE   Bilirubin Urine NEGATIVE NEGATIVE   Ketones, ur NEGATIVE NEGATIVE mg/dL   Protein, ur 100 (A) NEGATIVE mg/dL   Nitrite POSITIVE (A) NEGATIVE   Leukocytes,Ua LARGE (A) NEGATIVE   RBC / HPF >50 (H) 0 - 5 RBC/hpf   WBC, UA >50 (H) 0 - 5 WBC/hpf   Bacteria, UA MANY (A) NONE SEEN   Squamous Epithelial / LPF 0-5 0 - 5   WBC Clumps PRESENT    Mucus PRESENT   Influenza panel by PCR (type A & B)  Result Value Ref Range   Influenza A By PCR NEGATIVE NEGATIVE   Influenza B By PCR NEGATIVE NEGATIVE     EKG None  Radiology Dg Chest Portable 1 View  Result Date: 04/03/2018 CLINICAL DATA:  Sinus infection and cough for 5 days. Fever tonight. EXAM: PORTABLE CHEST 1 VIEW COMPARISON:  04/16/2013  FINDINGS: Cardiac silhouette is normal in size. No mediastinal or hilar masses. There is no evidence of adenopathy. Clear lungs.  No pleural effusion or pneumothorax. Skeletal structures are grossly intact. IMPRESSION: No active disease. Electronically Signed   By: Lajean Manes M.D.   On: 04/03/2018 00:17    Procedures Procedures (including critical care time)  Medications Ordered in ED Medications  acetaminophen (TYLENOL) tablet 650 mg (has no administration in time range)  sodium chloride 0.9 % bolus 1,000 mL (1,000 mLs Intravenous New Bag/Given 04/03/18 0146)    And  sodium chloride 0.9 % bolus 500 mL (0 mLs Intravenous Stopped 04/03/18 0147)    And  sodium chloride 0.9 % bolus 250 mL (0 mLs Intravenous Stopped 04/03/18 0147)  ceFEPIme (MAXIPIME) 2 g in sodium chloride 0.9 % 100 mL IVPB (0 g Intravenous Stopped 04/03/18 0148)  metroNIDAZOLE (FLAGYL) IVPB 500 mg (500 mg Intravenous New Bag/Given 04/03/18 0146)   vancomycin (VANCOCIN) IVPB 1000 mg/200 mL premix (1,000 mg Intravenous New Bag/Given 04/03/18 0046)     Initial Impression / Assessment and Plan / ED Course  I have reviewed the triage vital signs and the nursing notes.  Pertinent labs & imaging results that were available during my care of the patient were reviewed by me and considered in my medical decision making (see chart for details).        Patient to the ED with symptoms of maxillary sinus pressure, nasal drainage, sore throat, cough, SOB x 1 week with onset fever and rigors tonight. She has history of recurrent, similar sinus symptoms but the fever and chills is not typical, prompting ED visit.   The patient is generally healthy. She is nontoxic in appearance. Febrile on arrival to 101.2, tachycardic. Code Sepsis initiated, antibiotics for unknown source started. Cultures pending. IV fluids started at 30 mg/kg given.   On lab evaluation, the patient is found to have a UTI. She denies urinary symptoms on re-examination. Influenza tests and chest x-ray are negative. She demonstrates elevated liver enzymes with normal bilirubin. Elevated alk phos. She denies known history of liver dysfunction. No abdominal pain.   Differential diagnosis must include COVID-19 based on URI symptoms, including SOB, fever, cough, sore throat, congestion given the finding of elevated liver functions which is known to be associated with infection.   CT scan of abd pending for alternative cause of elevated transaminases. Patient and spouse updated on plan. Repeat lactic acid also pending despite that initial test was negative.   Repeat lactic acid negative. CT scan results support dx of pyelonephritis.   The patient has a confirmed infection that would cause fever and chills. She also has upper respiratory symptoms of ST, cough, SOB, congestion. COVID remains an active differential dx in the setting of unexplained transaminitis. RVP ordered with noval corona  virus.   Discussed option of discharge home to strict isolation vs admission to the hospital. She and her husband are currently living with family, including their son and grandson and cannot completely isolate. She is advanced age. She is not established with primary care since she is new to the [place] area. Admission is felt beneficial for her. Patient and husband agree.   Discussed with Dr. Hal Hope who accepts for admission.   Final Clinical Impressions(s) / ED Diagnoses   Final diagnoses:  None   1. URI 2. Elevated liver enzymes 3. Pyelonephritis  ED Discharge Orders    None       Charlann Lange, [place] 04/03/18 [id number]  Veryl Speak, MD 04/03/18 [phone]

## 2018-04-03 NOTE — Progress Notes (Signed)
A consult was received from an ED physician for Vancomycin & Cefepime per pharmacy dosing.  The patient's profile has been reviewed for ht/wt/allergies/indication/available labs.    A one time order has been placed for Vancomycin 1gm & Cefepime 2gm.  Further antibiotics/pharmacy consults should be ordered by admitting physician if indicated.                       Thank you,  Minda Ditto 04/03/2018  12:25 AM

## 2018-04-04 DIAGNOSIS — N390 Urinary tract infection, site not specified: Secondary | ICD-10-CM

## 2018-04-04 DIAGNOSIS — E876 Hypokalemia: Secondary | ICD-10-CM

## 2018-04-04 DIAGNOSIS — B9620 Unspecified Escherichia coli [E. coli] as the cause of diseases classified elsewhere: Secondary | ICD-10-CM

## 2018-04-04 DIAGNOSIS — J069 Acute upper respiratory infection, unspecified: Secondary | ICD-10-CM | POA: Diagnosis present

## 2018-04-04 LAB — COMPREHENSIVE METABOLIC PANEL
ALT: 164 U/L — ABNORMAL HIGH (ref 0–44)
ANION GAP: 8 (ref 5–15)
AST: 187 U/L — ABNORMAL HIGH (ref 15–41)
Albumin: 2.5 g/dL — ABNORMAL LOW (ref 3.5–5.0)
Alkaline Phosphatase: 210 U/L — ABNORMAL HIGH (ref 38–126)
BUN: 16 mg/dL (ref 8–23)
CO2: 19 mmol/L — ABNORMAL LOW (ref 22–32)
Calcium: 7.9 mg/dL — ABNORMAL LOW (ref 8.9–10.3)
Chloride: 112 mmol/L — ABNORMAL HIGH (ref 98–111)
Creatinine, Ser: 0.86 mg/dL (ref 0.44–1.00)
GFR calc Af Amer: >60 mL/min (ref >60)
GFR calc non Af Amer: >60 mL/min (ref >60)
Glucose, Bld: 98 mg/dL (ref 70–99)
POTASSIUM: 3.8 mmol/L (ref 3.5–5.1)
SODIUM: 139 mmol/L (ref 135–145)
Total Bilirubin: 0.5 mg/dL (ref 0.3–1.2)
Total Protein: 6 g/dL — ABNORMAL LOW (ref 6.5–8.1)

## 2018-04-04 LAB — URINE CULTURE: Culture: >=100000 — AB

## 2018-04-04 LAB — CBC WITH DIFFERENTIAL/PLATELET
Abs Immature Granulocytes: 0.1 10*3/uL — ABNORMAL HIGH (ref 0.00–0.07)
Basophils Absolute: 0 10*3/uL (ref 0.0–0.1)
Basophils Relative: 1 %
Eosinophils Absolute: 0.2 10*3/uL (ref 0.0–0.5)
Eosinophils Relative: 2 %
HCT: 37.8 % (ref 36.0–46.0)
Hemoglobin: 12.3 g/dL (ref 12.0–15.0)
Immature Granulocytes: 1 %
Lymphocytes Relative: 20 %
Lymphs Abs: 1.5 10*3/uL (ref 0.7–4.0)
MCH: 30.1 pg (ref 26.0–34.0)
MCHC: 32.5 g/dL (ref 30.0–36.0)
MCV: 92.6 fL (ref 80.0–100.0)
Monocytes Absolute: 0.7 10*3/uL (ref 0.1–1.0)
Monocytes Relative: 9 %
Neutro Abs: 5.3 10*3/uL (ref 1.7–7.7)
Neutrophils Relative %: 67 %
Platelets: 165 10*3/uL (ref 150–400)
RBC: 4.08 MIL/uL (ref 3.87–5.11)
RDW: 13.3 % (ref 11.5–15.5)
WBC: 7.8 10*3/uL (ref 4.0–10.5)
nRBC: 0 % (ref 0.0–0.2)

## 2018-04-04 LAB — EPSTEIN-BARR VIRUS (EBV) ANTIBODY PROFILE
EBV NA IgG: 524 U/mL — ABNORMAL HIGH (ref 0.0–17.9)
EBV VCA IgG: 294 U/mL — ABNORMAL HIGH (ref 0.0–17.9)
EBV VCA IgM: <36 U/mL (ref 0.0–35.9)

## 2018-04-04 LAB — HEPATITIS PANEL, ACUTE
HCV Ab: 0.1 s/co ratio (ref 0.0–0.9)
Hep A IgM: NEGATIVE
Hep B C IgM: NEGATIVE
Hepatitis B Surface Ag: NEGATIVE

## 2018-04-04 LAB — PHOSPHORUS: Phosphorus: 2.2 mg/dL — ABNORMAL LOW (ref 2.5–4.6)

## 2018-04-04 LAB — MAGNESIUM: Magnesium: 2.7 mg/dL — ABNORMAL HIGH (ref 1.7–2.4)

## 2018-04-04 MED ORDER — ALBUTEROL SULFATE HFA 108 (90 BASE) MCG/ACT IN AERS
2.0000 | INHALATION_SPRAY | RESPIRATORY_TRACT | Status: DC | PRN
  Filled 2018-04-04: qty 6.7

## 2018-04-04 MED ORDER — ALBUTEROL SULFATE HFA 108 (90 BASE) MCG/ACT IN AERS
2.0000 | INHALATION_SPRAY | Freq: Four times a day (QID) | RESPIRATORY_TRACT | Status: DC
  Administered 2018-04-04 (×2): 2 via RESPIRATORY_TRACT
  Filled 2018-04-04: qty 6.7

## 2018-04-04 MED ORDER — LORATADINE 10 MG PO TABS
10.0000 mg | ORAL_TABLET | Freq: Every day | ORAL | Status: DC
  Administered 2018-04-04 – 2018-04-07 (×4): 10 mg via ORAL
  Filled 2018-04-04 (×4): qty 1

## 2018-04-04 MED ORDER — AMOXICILLIN-POT CLAVULANATE 875-125 MG PO TABS
1.0000 | ORAL_TABLET | Freq: Two times a day (BID) | ORAL | Status: DC
  Administered 2018-04-04 – 2018-04-07 (×6): 1 via ORAL
  Filled 2018-04-04 (×6): qty 1

## 2018-04-04 MED ORDER — POTASSIUM PHOSPHATES 15 MMOLE/5ML IV SOLN
30.0000 mmol | Freq: Once | INTRAVENOUS | Status: AC
  Administered 2018-04-04: 30 mmol via INTRAVENOUS
  Filled 2018-04-04: qty 10

## 2018-04-04 MED ORDER — FLUTICASONE PROPIONATE 50 MCG/ACT NA SUSP
2.0000 | Freq: Every day | NASAL | Status: DC
  Administered 2018-04-04 – 2018-04-07 (×4): 2 via NASAL
  Filled 2018-04-04 (×2): qty 16

## 2018-04-04 MED ORDER — SODIUM CHLORIDE 0.9 % IV SOLN: INTRAVENOUS | Status: DC

## 2018-04-04 NOTE — Progress Notes (Signed)
PROGRESS NOTE    Abigail Acosta  JFH:[id number] DOB: [birth year] DOA: 04/02/2018 PCP: System, Pcp Not In    Brief Narrative: HPI per Dr. Gwenyth Bender is a 78 y.o. female with history of hypertension presents to the ER after patient was not feeling well and felt nauseated with upset stomach.  Patient states over the last 1 week patient has been having sinusitis symptoms with draining nose.  Had gone to [place] last week to pick her great-grandson at the time to have gone to her restaurant.  Denies any chest pain shortness of breath abdominal pain vomiting or diarrhea has been having nausea since last evening.  Since patient has been having upper respiratory tract symptoms patient has been taking ibuprofen and Tylenol.  Usually one Tylenol and 2 ibuprofen's per day for last 3 days.  ED Course: In the ER patient was found to be febrile with temperature of 101.2.  Labs show elevated LFTs with AST 377 alkaline phosphatase 238 ALT 219 total bilirubin was normal.  Lactate was normal.  WBC count was 9.3 hemoglobin 12.9 and platelets 130 2K UA showing normal sinus rhythm.  Days consistent with UTI.  Creatinine is 1.1.  Cute.  CT abdomen pelvis liver.  Shows features concerning for right-sided pyelonephritis and multiple lesions in the both kidneys which are not certain.  Patient started on antibiotics.  Since patient has fever chills with upper respiratory tract symptoms and flu panel being negative coronavirus COVID 19 panel was sent.  Respiratory viral panel is pending.  Assessment & Plan:   Principal Problem:   SIRS (systemic inflammatory response syndrome) (HCC) Active Problems:   Acute pyelonephritis   Elevated LFTs   CKD (chronic kidney disease) stage 3, GFR 30-59 ml/min (HCC)   Essential hypertension   E-coli UTI   Hypophosphatemia   Upper respiratory tract infection   Hypokalemia  1 systemic inflammatory response secondary to pyelonephritis/E. coli UTI Patient had presented  with fevers, chills, tachypnea.  Urinalysis consistent with a UTI.  Urine cultures positive for E. coli UTI.  MRSA PCR negative.  Cultures pending with no growth to date.  Patient improving clinically.  Afebrile.  Normal white count.  Respiratory status improving.  Patient on IV Rocephin which will transition to oral Augmentin and treat for total of 10 to 14 days.  2.  Viral upper respiratory tract infection Patient had presented with fevers, upper respiratory symptoms.  Due to pandemic of COVID 19 lab test has been sent and patient deemed low risk patient.  Influenza PCR negative.  Respiratory viral panel positive for coronavirus NL 68.  Patient with no significant hypoxia O2 requirements.  Placed on albuterol MDI 4 times daily.  Flonase.  Supportive care.  3.  Transaminitis Questionable etiology.  LFTs trending down.  CT abdomen and pelvis with no lesions noted on the liver.  Acute hepatitis panel negative.  EBV panel pending.  Supportive care.  4.  Hypophosphatemia Replete.  5.  Hypertension Patient's home regimen of captopril HCTZ on hold.  Continue Norvasc for now.  Follow.  6.  Chronic kidney disease stage III Creatinine stable.  Saline lock IV fluids.  7.  Hypokalemia Potassium repleted.  Keep magnesium greater than 2.   DVT prophylaxis: SCDs Code Status: Full Family Communication: Updated patient.  No family at bedside. Disposition Plan: Transfer to [place].  Patient however will remain in the stepdown unit as COVID 19 pending.   Consultants:   None  Procedures:   Chest x-ray 04/03/2018  CT abdomen and pelvis 04/03/2018  Antimicrobials:   IV Rocephin 04/03/2018>>>> 04/04/2018  Augmentin 04/04/2018   Subjective: Patient sitting up in bed.  Stated she having no nausea or vomiting.  Feeling better.  Denies any shortness of breath or chest pain.  Stated she blew her nose with some hard somewhat bloody exudate coming out of her nostrils.  Patient noted to have a  cough.  Objective: Vitals:   04/04/18 0437 04/04/18 0500 04/04/18 0600 04/04/18 0800  BP:  (!) 125/43 (!) 121/43 (!) 154/79  Pulse:  62 60 65  Resp:  20 (!) 21 (!) 21  Temp: 98.4 F (36.9 C)   97.9 F (36.6 C)  TempSrc: Oral   Oral  SpO2:  98% 98% 98%  Weight:      Height:        Intake/Output Summary (Last 24 hours) at 04/04/2018 1007 Last data filed at 04/04/2018 0838 Gross per 24 hour  Intake 2175.68 ml  Output 350 ml  Net 1825.68 ml   Filed Weights   04/02/18 2308 04/03/18 0815  Weight: 56.7 kg 55.9 kg    Examination:  General exam: NAD Respiratory system: Clear to auscultation. Respiratory effort normal. Cardiovascular system: S1 & S2 heard, RRR. No JVD, murmurs, rubs, gallops or clicks. No pedal edema. Gastrointestinal system: Abdomen is nondistended, soft and nontender. No organomegaly or masses felt. Normal bowel sounds heard.  No CVA tenderness. Central nervous system: Alert and oriented. No focal neurological deficits. Extremities: Symmetric 5 x 5 power. Skin: No rashes, lesions or ulcers Psychiatry: Judgement and insight appear normal. Mood & affect appropriate.     Data Reviewed: I have personally reviewed following labs and imaging studies  CBC: Recent Labs  Lab 04/03/18 0042 04/03/18 0838 04/04/18 0239  WBC 9.3 9.4 7.8  NEUTROABS 7.4 7.5 5.3  HGB 12.9 13.1 12.3  HCT 39.3 41.1 37.8  MCV 91.0 93.0 92.6  PLT 172 183 696   Basic Metabolic Panel: Recent Labs  Lab 04/03/18 0042 04/03/18 0838 04/04/18 0239  NA 140 143 139  K 3.2* 3.0* 3.8  CL 110 115* 112*  CO2 21* 21* 19*  GLUCOSE 130* 107* 98  BUN 27* 19 16  CREATININE 1.19* 1.02* 0.86  CALCIUM 8.7* 8.5* 7.9*  MG  --  1.8 2.7*  PHOS  --   --  2.2*   GFR: Estimated Creatinine Clearance: 44 mL/min (by C-G formula based on SCr of 0.86 mg/dL). Liver Function Tests: Recent Labs  Lab 04/03/18 0042 04/04/18 0239  AST 377* 187*  ALT 219* 164*  ALKPHOS 238* 210*  BILITOT 0.5 0.5  PROT  7.2 6.0*  ALBUMIN 3.2* 2.5*   No results for input(s): LIPASE, AMYLASE in the last 168 hours. No results for input(s): AMMONIA in the last 168 hours. Coagulation Profile: Recent Labs  Lab 04/03/18 0838  INR 1.1   Cardiac Enzymes: No results for input(s): CKTOTAL, CKMB, CKMBINDEX, TROPONINI in the last 168 hours. BNP (last 3 results) No results for input(s): PROBNP in the last 8760 hours. HbA1C: No results for input(s): HGBA1C in the last 72 hours. CBG: No results for input(s): GLUCAP in the last 168 hours. Lipid Profile: No results for input(s): CHOL, HDL, LDLCALC, TRIG, CHOLHDL, LDLDIRECT in the last 72 hours. Thyroid Function Tests: No results for input(s): TSH, T4TOTAL, FREET4, T3FREE, THYROIDAB in the last 72 hours. Anemia Panel: No results for input(s): VITAMINB12, FOLATE, FERRITIN, TIBC, IRON, RETICCTPCT in the last 72 hours. Sepsis Labs: Recent Labs  Lab 04/03/18 0042 04/03/18 0838 04/03/18 1134  PROCALCITON  --  0.58  --   LATICACIDVEN 0.9   0.8 0.9 1.3    Recent Results (from the past 240 hour(s))  Urine culture     Status: Abnormal   Collection Time: 04/03/18 12:29 AM  Result Value Ref Range Status   Specimen Description   Final    URINE, CLEAN CATCH Performed at [facility], [place] [address] [place], [place] [address]    Special Requests   Final    NONE Performed at [facility], [place] [address], [place], [place] [address]    Culture >=100,000 COLONIES/mL ESCHERICHIA COLI (A)  Final   Report Status 04/04/2018 FINAL  Final   Organism ID, Bacteria ESCHERICHIA COLI (A)  Final      Susceptibility   Escherichia coli - MIC*    AMPICILLIN >=32 RESISTANT Resistant     CEFAZOLIN <=4 SENSITIVE Sensitive     CEFTRIAXONE <=1 SENSITIVE Sensitive     CIPROFLOXACIN <=0.25 SENSITIVE Sensitive     GENTAMICIN <=1 SENSITIVE Sensitive     IMIPENEM <=0.25 SENSITIVE Sensitive     NITROFURANTOIN <=16 SENSITIVE Sensitive      TRIMETH/SULFA <=20 SENSITIVE Sensitive     AMPICILLIN/SULBACTAM 4 SENSITIVE Sensitive     PIP/TAZO <=4 SENSITIVE Sensitive     Extended ESBL NEGATIVE Sensitive     * >=100,000 COLONIES/mL ESCHERICHIA COLI  Respiratory Panel by PCR     Status: Abnormal   Collection Time: 04/03/18 12:57 AM  Result Value Ref Range Status   Adenovirus NOT DETECTED NOT DETECTED Final   Coronavirus 229E NOT DETECTED NOT DETECTED Final    Comment: (NOTE) The Coronavirus on the Respiratory Panel, DOES NOT test for the novel  Coronavirus (2019 nCoV)    Coronavirus HKU1 NOT DETECTED NOT DETECTED Final   Coronavirus NL63 DETECTED (A) NOT DETECTED Final   Coronavirus OC43 NOT DETECTED NOT DETECTED Final   Metapneumovirus NOT DETECTED NOT DETECTED Final   Rhinovirus / Enterovirus NOT DETECTED NOT DETECTED Final   Influenza A NOT DETECTED NOT DETECTED Final   Influenza B NOT DETECTED NOT DETECTED Final   Parainfluenza Virus 1 NOT DETECTED NOT DETECTED Final   Parainfluenza Virus 2 NOT DETECTED NOT DETECTED Final   Parainfluenza Virus 3 NOT DETECTED NOT DETECTED Final   Parainfluenza Virus 4 NOT DETECTED NOT DETECTED Final   Respiratory Syncytial Virus NOT DETECTED NOT DETECTED Final   Bordetella pertussis NOT DETECTED NOT DETECTED Final   Chlamydophila pneumoniae NOT DETECTED NOT DETECTED Final   Mycoplasma pneumoniae NOT DETECTED NOT DETECTED Final    Comment: Performed at [facility], [place] [address], [place], [place] [address]  MRSA PCR Screening     Status: None   Collection Time: 04/03/18  8:16 AM  Result Value Ref Range Status   MRSA by PCR NEGATIVE NEGATIVE Final    Comment:        The GeneXpert MRSA Assay (FDA approved for NASAL specimens only), is one component of a comprehensive MRSA colonization surveillance program. It is not intended to diagnose MRSA infection nor to guide or monitor treatment for MRSA infections. Performed at [facility], [place] [address],  [place], [place] [address]          Radiology Studies: Ct Abdomen Pelvis W Contrast  Result Date: 04/03/2018 CLINICAL DATA:  Fever, chills, UTI, transaminitis, wbc's 9.3, GFR 44, rbc's and wbc's in urine, hx of vaginal prolapse repair,  bilateral inguinal hernia repair, cystoscopy, appendectomy, rectocele and cystocele repair, and hysterectomy, no prev ct a/p [facility], prev ct a/p [facility] 03/12/16 EXAM: CT ABDOMEN AND PELVIS WITH CONTRAST TECHNIQUE: Multidetector CT imaging of the abdomen and pelvis was performed using the standard protocol following bolus administration of intravenous contrast. CONTRAST:  55mL ISOVUE-300 IOPAMIDOL (ISOVUE-300) INJECTION 61% COMPARISON:  None. FINDINGS: Lower chest: No acute abnormality. Hepatobiliary: Liver borderline enlarged. No masses or focal lesions. Normal gallbladder. No bile duct dilation. Pancreas: Unremarkable. No pancreatic ductal dilatation or surrounding inflammatory changes. Spleen: Normal in size without focal abnormality. Adrenals/Urinary Tract: No adrenal masses. Multiple small low-attenuation renal masses, too small to fully characterize, likely cysts. Fat density mass, posterior lower pole the right kidney, 1.8 cm, consistent with an angiomyolipoma. Subtle relative decreased attenuation in the upper pole the right kidney best seen on the delayed view. There is hazy perinephric stranding on the right. There is mild thickening of the posterior pararenal fascia. No perirenal fluid collection. There are no renal stones. No hydronephrosis. Ureters are normal in course and in caliber. Bladder is unremarkable. Stomach/Bowel: Stomach is unremarkable. Small bowel and colon are normal in caliber. No wall thickening or inflammation. Vascular/Lymphatic: Mild aortic atherosclerosis. No aneurysm. No other vascular abnormality. No enlarged lymph nodes. Reproductive: Status post hysterectomy. No adnexal masses. Other: No ascites.  No abdominal wall hernia.  Musculoskeletal: No fracture or acute finding. No osteoblastic or osteolytic lesions. IMPRESSION: 1. There is right perinephric haziness consistent with inflammation. Subtle area of relative hypoattenuation noted in the upper pole the right kidney. These findings are consistent with right-sided pyelonephritis. No evidence of an abscess. No hydronephrosis or evidence of obstructive uropathy. 2. Multiple low-density renal lesions seen bilaterally that are too small to fully characterize but are likely cysts. Right lower pole renal angiomyolipoma. 3. No other acute findings. 4. Mild aortic atherosclerosis. Electronically Signed   By: Lajean Manes M.D.   On: 04/03/2018 03:55   Dg Chest Portable 1 View  Result Date: 04/03/2018 CLINICAL DATA:  Sinus infection and cough for 5 days. Fever tonight. EXAM: PORTABLE CHEST 1 VIEW COMPARISON:  04/16/2013 FINDINGS: Cardiac silhouette is normal in size. No mediastinal or hilar masses. There is no evidence of adenopathy. Clear lungs.  No pleural effusion or pneumothorax. Skeletal structures are grossly intact. IMPRESSION: No active disease. Electronically Signed   By: Lajean Manes M.D.   On: 04/03/2018 00:17        Scheduled Meds:  albuterol  2 puff Inhalation QID   amLODipine  5 mg Oral Daily   amoxicillin-clavulanate  1 tablet Oral Q12H   fluticasone  2 spray Each Nare Daily   loratadine  10 mg Oral Daily   Continuous Infusions:  potassium PHOSPHATE IVPB (in mmol) 30 mmol (04/04/18 0841)     LOS: 1 day    Time spent: 40 minutes    Irine Seal, MD [facility]  If 7PM-7AM, please contact night-coverage [email or web address] 04/04/2018, 10:07 AM

## 2018-04-04 NOTE — Progress Notes (Signed)
Report received from Judson Roch, [place]. I agree with previous RN's assessment. Will continue to monitor closely. Carnella [place] I

## 2018-04-05 LAB — CBC WITH DIFFERENTIAL/PLATELET
ABS IMMATURE GRANULOCYTES: 0.19 10*3/uL — AB (ref 0.00–0.07)
Basophils Absolute: 0.1 10*3/uL (ref 0.0–0.1)
Basophils Relative: 1 %
Eosinophils Absolute: 0.2 10*3/uL (ref 0.0–0.5)
Eosinophils Relative: 3 %
HCT: 37.4 % (ref 36.0–46.0)
Hemoglobin: 12.2 g/dL (ref 12.0–15.0)
Immature Granulocytes: 3 %
Lymphocytes Relative: 28 %
Lymphs Abs: 1.9 10*3/uL (ref 0.7–4.0)
MCH: 30.1 pg (ref 26.0–34.0)
MCHC: 32.6 g/dL (ref 30.0–36.0)
MCV: 92.3 fL (ref 80.0–100.0)
Monocytes Absolute: 0.6 10*3/uL (ref 0.1–1.0)
Monocytes Relative: 8 %
Neutro Abs: 3.9 10*3/uL (ref 1.7–7.7)
Neutrophils Relative %: 57 %
PLATELETS: 200 10*3/uL (ref 150–400)
RBC: 4.05 MIL/uL (ref 3.87–5.11)
RDW: 13.2 % (ref 11.5–15.5)
WBC: 6.8 10*3/uL (ref 4.0–10.5)
nRBC: 0 % (ref 0.0–0.2)

## 2018-04-05 LAB — COMPREHENSIVE METABOLIC PANEL
ALT: 123 U/L — AB (ref 0–44)
AST: 86 U/L — ABNORMAL HIGH (ref 15–41)
Albumin: 2.5 g/dL — ABNORMAL LOW (ref 3.5–5.0)
Alkaline Phosphatase: 216 U/L — ABNORMAL HIGH (ref 38–126)
Anion gap: 7 (ref 5–15)
BUN: 18 mg/dL (ref 8–23)
CHLORIDE: 109 mmol/L (ref 98–111)
CO2: 22 mmol/L (ref 22–32)
Calcium: 8.2 mg/dL — ABNORMAL LOW (ref 8.9–10.3)
Creatinine, Ser: 0.96 mg/dL (ref 0.44–1.00)
GFR calc Af Amer: >60 mL/min (ref >60)
GFR calc non Af Amer: 57 mL/min — ABNORMAL LOW (ref >60)
Glucose, Bld: 99 mg/dL (ref 70–99)
Potassium: 3.5 mmol/L (ref 3.5–5.1)
Sodium: 138 mmol/L (ref 135–145)
Total Bilirubin: 0.3 mg/dL (ref 0.3–1.2)
Total Protein: 6 g/dL — ABNORMAL LOW (ref 6.5–8.1)

## 2018-04-05 LAB — PHOSPHORUS: Phosphorus: 3.9 mg/dL (ref 2.5–4.6)

## 2018-04-05 MED ORDER — GUAIFENESIN ER 600 MG PO TB12
1200.0000 mg | ORAL_TABLET | Freq: Two times a day (BID) | ORAL | Status: DC
  Administered 2018-04-05 – 2018-04-07 (×5): 1200 mg via ORAL
  Filled 2018-04-05 (×5): qty 2

## 2018-04-05 MED ORDER — POTASSIUM CHLORIDE CRYS ER 20 MEQ PO TBCR
40.0000 meq | EXTENDED_RELEASE_TABLET | Freq: Once | ORAL | Status: AC
  Administered 2018-04-05: 40 meq via ORAL
  Filled 2018-04-05: qty 2

## 2018-04-05 NOTE — Progress Notes (Signed)
PROGRESS NOTE    Abigail Acosta  GUR:[id number] DOB: [birth year] DOA: 04/02/2018 PCP: System, Pcp Not In    Brief Narrative: HPI per Dr. Gwenyth Bender is a 78 y.o. female with history of hypertension presents to the ER after patient was not feeling well and felt nauseated with upset stomach.  Patient states over the last 1 week patient has been having sinusitis symptoms with draining nose.  Had gone to [place] last week to pick her great-grandson at the time to have gone to her restaurant.  Denies any chest pain shortness of breath abdominal pain vomiting or diarrhea has been having nausea since last evening.  Since patient has been having upper respiratory tract symptoms patient has been taking ibuprofen and Tylenol.  Usually one Tylenol and 2 ibuprofen's per day for last 3 days.  ED Course: In the ER patient was found to be febrile with temperature of 101.2.  Labs show elevated LFTs with AST 377 alkaline phosphatase 238 ALT 219 total bilirubin was normal.  Lactate was normal.  WBC count was 9.3 hemoglobin 12.9 and platelets 130 2K UA showing normal sinus rhythm.  Days consistent with UTI.  Creatinine is 1.1.  Cute.  CT abdomen pelvis liver.  Shows features concerning for right-sided pyelonephritis and multiple lesions in the both kidneys which are not certain.  Patient started on antibiotics.  Since patient has fever chills with upper respiratory tract symptoms and flu panel being negative coronavirus COVID 19 panel was sent.  Respiratory viral panel is pending.  Assessment & Plan:   Principal Problem:   SIRS (systemic inflammatory response syndrome) (HCC) Active Problems:   Acute pyelonephritis   Elevated LFTs   CKD (chronic kidney disease) stage 3, GFR 30-59 ml/min (HCC)   Essential hypertension   E-coli UTI   Hypophosphatemia   Upper respiratory tract infection   Hypokalemia  1 systemic inflammatory response secondary to pyelonephritis/E. coli UTI Patient had presented  with fevers, chills, tachypnea.  Urinalysis consistent with a UTI.  CT abdomen and pelvis consistent with pyelonephritis.  Urine cultures positive for E. coli UTI.  MRSA PCR negative.  Blood cultures pending with no growth to date.  Patient improving clinically.  Afebrile.  Normal white count.  Respiratory status improving.  IV Rocephin has been transitioned to oral Augmentin which patient is tolerating will treat for total of 10 days.   2.  Viral upper respiratory tract infection Patient had presented with fevers, upper respiratory symptoms.  Due to pandemic of COVID 19 lab test has been sent and patient deemed low risk patient.  Influenza PCR negative.  Respiratory viral panel positive for coronavirus NL 68.  Patient with no significant hypoxia O2 requirements.  Fever curve trended down.  Continue Flonase, Claritin, albuterol as needed.  Add Mucinex.  Supportive care.   3.  Transaminitis Questionable etiology.  LFTs trending down.  CT abdomen and pelvis with no lesions noted on the liver.  Acute hepatitis panel negative.  EBV panel negative for any acute infection.  EBV IgG elevated likely indicating prior infection.  Supportive care.  4.  Hypophosphatemia Repleted.  5.  Hypertension Patient's home regimen of captopril HCTZ on hold.  Continue Norvasc for now.  Follow.  6.  Chronic kidney disease stage III Creatinine stable.  IV fluids have been saline locked.   7.  Hypokalemia Potassium at 3.5 this morning.  Magnesium was 2.7 on 04/04/2018.  K. Dur 40 mEq p.o. x1.    DVT prophylaxis: SCDs Code Status:  Full Family Communication: Updated patient.  No family at bedside. Disposition Plan: MedSurg.  Patient however will remain in the stepdown unit as COVID 19 pending.   Consultants:   None  Procedures:   Chest x-ray 04/03/2018  CT abdomen and pelvis 04/03/2018  Antimicrobials:   IV Rocephin 04/03/2018>>>> 04/04/2018  Augmentin 04/04/2018   Subjective: Patient sitting up in bed  playing solitaire on her iPad.  Denies any chest pain.  Denies any significant shortness of breath.  States she is having a more productive cough and increased nasal drainage.  Feeling better than on admission.    Objective: Vitals:   04/05/18 0013 04/05/18 0400 04/05/18 0420 04/05/18 0827  BP:  (!) 147/98    Pulse:  100    Resp:      Temp: 99 F (37.2 C)  98.7 F (37.1 C) 98 F (36.7 C)  TempSrc: Oral  Oral Oral  SpO2:  98%    Weight:      Height:        Intake/Output Summary (Last 24 hours) at 04/05/2018 0924 Last data filed at 04/05/2018 0827 Gross per 24 hour  Intake 729.99 ml  Output -  Net 729.99 ml   Filed Weights   04/02/18 2308 04/03/18 0815  Weight: 56.7 kg 55.9 kg    Examination:  General exam: NAD Respiratory system: Lungs clear to auscultation bilaterally.  No wheezes, no crackles, no rhonchi.  Good air movement.  Speaking in full sentences.  Respiratory effort normal. Cardiovascular system: Regular rate rhythm no murmurs rubs or gallops.  No JVD.  No lower extremity edema.  Gastrointestinal system: Abdomen is soft, nontender, nondistended, positive bowel sounds.  No rebound.  No guarding.  No CVA tenderness.  Central nervous system: Alert and oriented. No focal neurological deficits. Extremities: Symmetric 5 x 5 power. Skin: No rashes, lesions or ulcers Psychiatry: Judgement and insight appear normal. Mood & affect appropriate.     Data Reviewed: I have personally reviewed following labs and imaging studies  CBC: Recent Labs  Lab 04/03/18 0042 04/03/18 0838 04/04/18 0239 04/05/18 0303  WBC 9.3 9.4 7.8 6.8  NEUTROABS 7.4 7.5 5.3 3.9  HGB 12.9 13.1 12.3 12.2  HCT 39.3 41.1 37.8 37.4  MCV 91.0 93.0 92.6 92.3  PLT 172 183 165 425   Basic Metabolic Panel: Recent Labs  Lab 04/03/18 0042 04/03/18 0838 04/04/18 0239 04/05/18 0303  NA 140 143 139 138  K 3.2* 3.0* 3.8 3.5  CL 110 115* 112* 109  CO2 21* 21* 19* 22  GLUCOSE 130* 107* 98 99  BUN  27* 19 16 18   CREATININE 1.19* 1.02* 0.86 0.96  CALCIUM 8.7* 8.5* 7.9* 8.2*  MG  --  1.8 2.7*  --   PHOS  --   --  2.2* 3.9   GFR: Estimated Creatinine Clearance: 39.4 mL/min (by C-G formula based on SCr of 0.96 mg/dL). Liver Function Tests: Recent Labs  Lab 04/03/18 0042 04/04/18 0239 04/05/18 0303  AST 377* 187* 86*  ALT 219* 164* 123*  ALKPHOS 238* 210* 216*  BILITOT 0.5 0.5 0.3  PROT 7.2 6.0* 6.0*  ALBUMIN 3.2* 2.5* 2.5*   No results for input(s): LIPASE, AMYLASE in the last 168 hours. No results for input(s): AMMONIA in the last 168 hours. Coagulation Profile: Recent Labs  Lab 04/03/18 0838  INR 1.1   Cardiac Enzymes: No results for input(s): CKTOTAL, CKMB, CKMBINDEX, TROPONINI in the last 168 hours. BNP (last 3 results) No results for input(s): PROBNP  in the last 8760 hours. HbA1C: No results for input(s): HGBA1C in the last 72 hours. CBG: No results for input(s): GLUCAP in the last 168 hours. Lipid Profile: No results for input(s): CHOL, HDL, LDLCALC, TRIG, CHOLHDL, LDLDIRECT in the last 72 hours. Thyroid Function Tests: No results for input(s): TSH, T4TOTAL, FREET4, T3FREE, THYROIDAB in the last 72 hours. Anemia Panel: No results for input(s): VITAMINB12, FOLATE, FERRITIN, TIBC, IRON, RETICCTPCT in the last 72 hours. Sepsis Labs: Recent Labs  Lab 04/03/18 0042 04/03/18 0838 04/03/18 1134  PROCALCITON  --  0.58  --   LATICACIDVEN 0.9  0.8 0.9 1.3    Recent Results (from the past 240 hour(s))  Urine culture     Status: Abnormal   Collection Time: 04/03/18 12:29 AM  Result Value Ref Range Status   Specimen Description   Final    URINE, CLEAN CATCH Performed at [facility], [place] [address], [place], [place] [address]    Special Requests   Final    NONE Performed at [facility], [place] [address], [place], [place] [address]    Culture >=100,000 COLONIES/mL ESCHERICHIA COLI (A)  Final   Report Status 04/04/2018  FINAL  Final   Organism ID, Bacteria ESCHERICHIA COLI (A)  Final      Susceptibility   Escherichia coli - MIC*    AMPICILLIN >=32 RESISTANT Resistant     CEFAZOLIN <=4 SENSITIVE Sensitive     CEFTRIAXONE <=1 SENSITIVE Sensitive     CIPROFLOXACIN <=0.25 SENSITIVE Sensitive     GENTAMICIN <=1 SENSITIVE Sensitive     IMIPENEM <=0.25 SENSITIVE Sensitive     NITROFURANTOIN <=16 SENSITIVE Sensitive     TRIMETH/SULFA <=20 SENSITIVE Sensitive     AMPICILLIN/SULBACTAM 4 SENSITIVE Sensitive     PIP/TAZO <=4 SENSITIVE Sensitive     Extended ESBL NEGATIVE Sensitive     * >=100,000 COLONIES/mL ESCHERICHIA COLI  Culture, blood (routine x 2)     Status: None (Preliminary result)   Collection Time: 04/03/18 12:42 AM  Result Value Ref Range Status   Specimen Description   Final    BLOOD BLOOD RIGHT FOREARM Performed at [facility] [address], [place], [place] [address]    Special Requests   Final    BOTTLES DRAWN AEROBIC AND ANAEROBIC Blood Culture adequate volume Performed at [facility] [address] [place], [place] [address]    Culture   Final    NO GROWTH 2 DAYS Performed at [facility] [address], [place], [place] [address]    Report Status PENDING  Incomplete  Culture, blood (routine x 2)     Status: None (Preliminary result)   Collection Time: 04/03/18 12:42 AM  Result Value Ref Range Status   Specimen Description   Final    BLOOD BLOOD LEFT FOREARM Performed at [facility] [address] [place], [place] [address]    Special Requests   Final    BOTTLES DRAWN AEROBIC AND ANAEROBIC Blood Culture adequate volume Performed at [facility] [address] [address], [place], [place] [address]    Culture   Final    NO GROWTH 2 DAYS Performed at [facility] [address] [place], [place] [address]    Report Status PENDING  Incomplete  Respiratory Panel by PCR     Status: Abnormal    Collection Time: 04/03/18 12:57 AM  Result Value Ref Range Status   Adenovirus NOT  DETECTED NOT DETECTED Final   Coronavirus 229E NOT DETECTED NOT DETECTED Final    Comment: (NOTE) The Coronavirus on the Respiratory Panel, DOES NOT test for the novel  Coronavirus (2019 nCoV)    Coronavirus HKU1 NOT DETECTED NOT DETECTED Final   Coronavirus NL63 DETECTED (A) NOT DETECTED Final   Coronavirus OC43 NOT DETECTED NOT DETECTED Final   Metapneumovirus NOT DETECTED NOT DETECTED Final   Rhinovirus / Enterovirus NOT DETECTED NOT DETECTED Final   Influenza A NOT DETECTED NOT DETECTED Final   Influenza B NOT DETECTED NOT DETECTED Final   Parainfluenza Virus 1 NOT DETECTED NOT DETECTED Final   Parainfluenza Virus 2 NOT DETECTED NOT DETECTED Final   Parainfluenza Virus 3 NOT DETECTED NOT DETECTED Final   Parainfluenza Virus 4 NOT DETECTED NOT DETECTED Final   Respiratory Syncytial Virus NOT DETECTED NOT DETECTED Final   Bordetella pertussis NOT DETECTED NOT DETECTED Final   Chlamydophila pneumoniae NOT DETECTED NOT DETECTED Final   Mycoplasma pneumoniae NOT DETECTED NOT DETECTED Final    Comment: Performed at [facility], [place] [address], [place], [place] [address]  MRSA PCR Screening     Status: None   Collection Time: 04/03/18  8:16 AM  Result Value Ref Range Status   MRSA by PCR NEGATIVE NEGATIVE Final    Comment:        The GeneXpert MRSA Assay (FDA approved for NASAL specimens only), is one component of a comprehensive MRSA colonization surveillance program. It is not intended to diagnose MRSA infection nor to guide or monitor treatment for MRSA infections. Performed at [facility], [place] [address] [place],  [address]          Radiology Studies: No results found.      Scheduled Meds: . amLODipine  5 mg Oral Daily  . amoxicillin-clavulanate  1 tablet Oral Q12H  . fluticasone  2 spray Each Nare Daily  . loratadine  10 mg Oral Daily  .  potassium chloride  40 mEq Oral Once   Continuous Infusions:    LOS: 2 days    Time spent: 35 minutes    Irine Seal, MD [facility]  If 7PM-7AM, please contact night-coverage [email or web address] 04/05/2018, 9:24 AM

## 2018-04-05 NOTE — Progress Notes (Signed)
Pt alert x4, orders given to tx Pt to a non-teley floor. Report given to [facility] RN who will reassume care.

## 2018-04-06 LAB — COMPREHENSIVE METABOLIC PANEL
ALT: 90 U/L — ABNORMAL HIGH (ref 0–44)
AST: 41 U/L (ref 15–41)
Albumin: 2.7 g/dL — ABNORMAL LOW (ref 3.5–5.0)
Alkaline Phosphatase: 186 U/L — ABNORMAL HIGH (ref 38–126)
Anion gap: 9 (ref 5–15)
BILIRUBIN TOTAL: 0.4 mg/dL (ref 0.3–1.2)
BUN: 22 mg/dL (ref 8–23)
CO2: 22 mmol/L (ref 22–32)
Calcium: 8.5 mg/dL — ABNORMAL LOW (ref 8.9–10.3)
Chloride: 108 mmol/L (ref 98–111)
Creatinine, Ser: 0.93 mg/dL (ref 0.44–1.00)
GFR calc Af Amer: >60 mL/min (ref >60)
GFR calc non Af Amer: 60 mL/min — ABNORMAL LOW (ref >60)
Glucose, Bld: 89 mg/dL (ref 70–99)
Potassium: 4.1 mmol/L (ref 3.5–5.1)
Sodium: 139 mmol/L (ref 135–145)
Total Protein: 6.3 g/dL — ABNORMAL LOW (ref 6.5–8.1)

## 2018-04-06 NOTE — Progress Notes (Signed)
PROGRESS NOTE    Abigail Acosta  ZOX:[id number] DOB: August 05, 1940 DOA: 04/02/2018 PCP: System, Pcp Not In    Brief Narrative: HPI per Dr. Gwenyth Bender is a 78 y.o. female with history of hypertension presents to the ER after patient was not feeling well and felt nauseated with upset stomach. Patient states over the last 1 week patient has been having sinusitis symptoms with draining nose.  Had gone to [place] last week to pick her great-grandson at the time to have gone to her restaurant.  Denies any chest pain shortness of breath abdominal pain vomiting or diarrhea has been having nausea since last evening.  Since patient has been having upper respiratory tract symptoms patient has been taking ibuprofen and Tylenol.  Usually one Tylenol and 2 ibuprofen's per day for last 3 days.  ED Course: In the ER patient was found to be febrile with temperature of 101.2. Labs show elevated LFTs with AST 377 alkaline phosphatase 238 ALT 219 total bilirubin was normal.  Lactate was normal.  WBC count was 9.3 hemoglobin 12.9 and platelets 130 2K UA showing normal sinus rhythm.  Days consistent with UTI.  Creatinine is 1.1.  Cute.  CT abdomen pelvis liver.  Shows features concerning for right-sided pyelonephritis and multiple lesions in the both kidneys which are not certain.  Patient started on antibiotics.  Since patient has fever chills with upper respiratory tract symptoms and flu panel being negative coronavirus COVID 19 panel was sent.  Respiratory viral panel is pending.  Assessment & Plan:   Principal Problem:   SIRS (systemic inflammatory response syndrome) (HCC) Active Problems:   Acute pyelonephritis   Elevated LFTs   CKD (chronic kidney disease) stage 3, GFR 30-59 ml/min (HCC)   Essential hypertension   E-coli UTI   Hypophosphatemia   Upper respiratory tract infection   Hypokalemia  Sepsis; present on admission pyelonephritis/E. coli UTI Patient had presented with fevers,  chills, tachypnea.  Urinalysis consistent with a UTI.  CT abdomen and pelvis consistent with pyelonephritis.  Urine cultures positive for E. coli UTI.  MRSA PCR negative.  Blood cultures pending with no growth to date.  Patient improving clinically.  Afebrile.  Normal white count.  Respiratory status improving.  IV Rocephin has been transitioned to oral Augmentin which patient is tolerating will treat for total of 10 days.   Viral upper respiratory tract infection Patient had presented with fevers, upper respiratory symptoms.  Due to pandemic of COVID 19 lab test has been sent and patient deemed low risk patient.  Influenza PCR negative.  Respiratory viral panel positive for coronavirus NL 68.  Patient with no significant hypoxia O2 requirements.  Fever curve trended down.  Continue Flonase, Claritin, albuterol as needed.  Add Mucinex.  Supportive care.   Transaminitis Questionable etiology.  LFTs trending down.  CT abdomen and pelvis with no lesions noted on the liver.  Acute hepatitis panel negative.  EBV panel negative for any acute infection.  EBV IgG elevated likely indicating prior infection.  Supportive care.   Hypophosphatemia Repleted.  Hypertension Patient's home regimen of captopril HCTZ on hold.  Continue Norvasc for now.  Follow.  Chronic kidney disease stage III Creatinine stable.    Hypokalemia Potassium at 4.1 this morning.  Magnesium was 2.7 on 04/04/2018.    DVT prophylaxis: SCDs Code Status: Full Family Communication: husband at bedside Disposition Plan: MedSurg.  Await COVID 19 given home living situation.   Consultants:   None  Procedures:   Chest x-ray  04/03/2018  CT abdomen and pelvis 04/03/2018  Antimicrobials:   IV Rocephin 04/03/2018 - 04/04/2018  Augmentin 04/04/2018   Subjective:  Patient resting comfortably in bedside chair, watching her iPad.  Husband present.  No complaints today.  Awaiting COVID 19 lab test.  Denies headache, no visual changes,  no chest pain, no shortness of breath, no palpitations, no abdominal pain, no issues with bowel or bladder function.  No acute events overnight per nursing staff.  Objective: Vitals:   04/05/18 1206 04/05/18 1534 04/05/18 2111 04/06/18 0523  BP:  (!) 135/51 (!) 148/59 (!) 138/49  Pulse:  72 75 67  Resp:   18 18  Temp: 98.2 F (36.8 C) 98.5 F (36.9 C) 98.1 F (36.7 C) 98.3 F (36.8 C)  TempSrc: Oral  Oral Oral  SpO2:  98% 100% 94%  Weight:    55.9 kg  Height:       No intake or output data in the 24 hours ending 04/06/18 1252 Filed Weights   04/02/18 2308 04/03/18 0815 04/06/18 0523  Weight: 56.7 kg 55.9 kg 55.9 kg    Examination:  General exam: NAD Respiratory system: Lungs clear to auscultation bilaterally.  No wheezes, no crackles, no rhonchi.  Good air movement.  Speaking in full sentences.  Respiratory effort normal. Cardiovascular system: Regular rate rhythm no murmurs rubs or gallops.  No JVD.  No lower extremity edema.  Gastrointestinal system: Abdomen is soft, nontender, nondistended, positive bowel sounds.  No rebound.  No guarding.  No CVA tenderness.  Central nervous system: Alert and oriented. No focal neurological deficits. Extremities: Symmetric 5 x 5 power. Skin: No rashes, lesions or ulcers Psychiatry: Judgement and insight appear normal. Mood & affect appropriate.     Data Reviewed: I have personally reviewed following labs and imaging studies  CBC: Recent Labs  Lab 04/03/18 0042 04/03/18 0838 04/04/18 0239 04/05/18 0303  WBC 9.3 9.4 7.8 6.8  NEUTROABS 7.4 7.5 5.3 3.9  HGB 12.9 13.1 12.3 12.2  HCT 39.3 41.1 37.8 37.4  MCV 91.0 93.0 92.6 92.3  PLT 172 183 165 803   Basic Metabolic Panel: Recent Labs  Lab 04/03/18 0042 04/03/18 0838 04/04/18 0239 04/05/18 0303 04/06/18 0501  NA 140 143 139 138 139  K 3.2* 3.0* 3.8 3.5 4.1  CL 110 115* 112* 109 108  CO2 21* 21* 19* 22 22  GLUCOSE 130* 107* 98 99 89  BUN 27* 19 16 18 22   CREATININE  1.19* 1.02* 0.86 0.96 0.93  CALCIUM 8.7* 8.5* 7.9* 8.2* 8.5*  MG  --  1.8 2.7*  --   --   PHOS  --   --  2.2* 3.9  --    GFR: Estimated Creatinine Clearance: 40.7 mL/min (by C-G formula based on SCr of 0.93 mg/dL). Liver Function Tests: Recent Labs  Lab 04/03/18 0042 04/04/18 0239 04/05/18 0303 04/06/18 0501  AST 377* 187* 86* 41  ALT 219* 164* 123* 90*  ALKPHOS 238* 210* 216* 186*  BILITOT 0.5 0.5 0.3 0.4  PROT 7.2 6.0* 6.0* 6.3*  ALBUMIN 3.2* 2.5* 2.5* 2.7*   No results for input(s): LIPASE, AMYLASE in the last 168 hours. No results for input(s): AMMONIA in the last 168 hours. Coagulation Profile: Recent Labs  Lab 04/03/18 0838  INR 1.1   Cardiac Enzymes: No results for input(s): CKTOTAL, CKMB, CKMBINDEX, TROPONINI in the last 168 hours. BNP (last 3 results) No results for input(s): PROBNP in the last 8760 hours. HbA1C: No results for input(s):  HGBA1C in the last 72 hours. CBG: No results for input(s): GLUCAP in the last 168 hours. Lipid Profile: No results for input(s): CHOL, HDL, LDLCALC, TRIG, CHOLHDL, LDLDIRECT in the last 72 hours. Thyroid Function Tests: No results for input(s): TSH, T4TOTAL, FREET4, T3FREE, THYROIDAB in the last 72 hours. Anemia Panel: No results for input(s): VITAMINB12, FOLATE, FERRITIN, TIBC, IRON, RETICCTPCT in the last 72 hours. Sepsis Labs: Recent Labs  Lab 04/03/18 0042 04/03/18 0838 04/03/18 1134  PROCALCITON  --  0.58  --   LATICACIDVEN 0.9  0.8 0.9 1.3    Recent Results (from the past 240 hour(s))  Urine culture     Status: Abnormal   Collection Time: 04/03/18 12:29 AM  Result Value Ref Range Status   Specimen Description   Final    URINE, CLEAN CATCH Performed at [facility], [place] [address] [place], [place] [address]    Special Requests   Final    NONE Performed at [facility], [place] [address], [place], [place] [address]    Culture >=100,000 COLONIES/mL ESCHERICHIA COLI (A)   Final   Report Status 04/04/2018 FINAL  Final   Organism ID, Bacteria ESCHERICHIA COLI (A)  Final      Susceptibility   Escherichia coli - MIC*    AMPICILLIN >=32 RESISTANT Resistant     CEFAZOLIN <=4 SENSITIVE Sensitive     CEFTRIAXONE <=1 SENSITIVE Sensitive     CIPROFLOXACIN <=0.25 SENSITIVE Sensitive     GENTAMICIN <=1 SENSITIVE Sensitive     IMIPENEM <=0.25 SENSITIVE Sensitive     NITROFURANTOIN <=16 SENSITIVE Sensitive     TRIMETH/SULFA <=20 SENSITIVE Sensitive     AMPICILLIN/SULBACTAM 4 SENSITIVE Sensitive     PIP/TAZO <=4 SENSITIVE Sensitive     Extended ESBL NEGATIVE Sensitive     * >=100,000 COLONIES/mL ESCHERICHIA COLI  Culture, blood (routine x 2)     Status: None (Preliminary result)   Collection Time: 04/03/18 12:42 AM  Result Value Ref Range Status   Specimen Description   Final    BLOOD BLOOD RIGHT FOREARM Performed at [facility] [address], [place], [place] [address]    Special Requests   Final    BOTTLES DRAWN AEROBIC AND ANAEROBIC Blood Culture adequate volume Performed at [facility] [address], [place], [place] [address]    Culture   Final    NO GROWTH 3 DAYS Performed at [facility], [place] [address] [place], [place] [address]    Report Status PENDING  Incomplete  Culture, blood (routine x 2)     Status: None (Preliminary result)   Collection Time: 04/03/18 12:42 AM  Result Value Ref Range Status   Specimen Description   Final    BLOOD BLOOD LEFT FOREARM Performed at [facility] [address], [place], [place] [address]    Special Requests   Final    BOTTLES DRAWN AEROBIC AND ANAEROBIC Blood Culture adequate volume Performed at [facility] [address] [place], [place] [address]    Culture   Final    NO GROWTH 3 DAYS Performed at [facility], [place] [address], [place], [place] [address]    Report Status PENDING  Incomplete  Respiratory  Panel by PCR     Status: Abnormal   Collection Time: 04/03/18 12:57 AM  Result Value Ref Range Status   Adenovirus NOT DETECTED NOT DETECTED Final   Coronavirus 229E NOT DETECTED  NOT DETECTED Final    Comment: (NOTE) The Coronavirus on the Respiratory Panel, DOES NOT test for the novel  Coronavirus (2019 nCoV)    Coronavirus HKU1 NOT DETECTED NOT DETECTED Final   Coronavirus NL63 DETECTED (A) NOT DETECTED Final   Coronavirus OC43 NOT DETECTED NOT DETECTED Final   Metapneumovirus NOT DETECTED NOT DETECTED Final   Rhinovirus / Enterovirus NOT DETECTED NOT DETECTED Final   Influenza A NOT DETECTED NOT DETECTED Final   Influenza B NOT DETECTED NOT DETECTED Final   Parainfluenza Virus 1 NOT DETECTED NOT DETECTED Final   Parainfluenza Virus 2 NOT DETECTED NOT DETECTED Final   Parainfluenza Virus 3 NOT DETECTED NOT DETECTED Final   Parainfluenza Virus 4 NOT DETECTED NOT DETECTED Final   Respiratory Syncytial Virus NOT DETECTED NOT DETECTED Final   Bordetella pertussis NOT DETECTED NOT DETECTED Final   Chlamydophila pneumoniae NOT DETECTED NOT DETECTED Final   Mycoplasma pneumoniae NOT DETECTED NOT DETECTED Final    Comment: Performed at [facility], [place] [address] [place], [place] [address]  MRSA PCR Screening     Status: None   Collection Time: 04/03/18  8:16 AM  Result Value Ref Range Status   MRSA by PCR NEGATIVE NEGATIVE Final    Comment:        The GeneXpert MRSA Assay (FDA approved for NASAL specimens only), is one component of a comprehensive MRSA colonization surveillance program. It is not intended to diagnose MRSA infection nor to guide or monitor treatment for MRSA infections. Performed at [facility], [place] [address], [place], [place] [address]          Radiology Studies: No results found.      Scheduled Meds: . amLODipine  5 mg Oral Daily  . amoxicillin-clavulanate  1 tablet Oral Q12H  . fluticasone  2 spray Each Nare Daily  .  guaiFENesin  1,200 mg Oral BID  . loratadine  10 mg Oral Daily   Continuous Infusions:    LOS: 3 days    Time spent: 29 minutes    Eric J [place] DO [facility] Pager: [phone]  If 7PM-7AM, please contact night-coverage [email or web address] 04/06/2018, 12:52 PM

## 2018-04-07 DIAGNOSIS — R6889 Other general symptoms and signs: Secondary | ICD-10-CM

## 2018-04-07 DIAGNOSIS — Z20822 Contact with and (suspected) exposure to covid-19: Secondary | ICD-10-CM | POA: Diagnosis present

## 2018-04-07 MED ORDER — FLUTICASONE PROPIONATE 50 MCG/ACT NA SUSP: 2.0000 | Freq: Every day | NASAL | 2 refills | Status: DC

## 2018-04-07 MED ORDER — AMOXICILLIN-POT CLAVULANATE 875-125 MG PO TABS: 1.0000 | ORAL_TABLET | Freq: Two times a day (BID) | ORAL | 0 refills | Status: AC

## 2018-04-07 NOTE — Discharge Instructions (Signed)
Pyelonephritis, Adult    Pyelonephritis is a kidney infection. The kidneys are organs that help clean your blood by moving waste out of your blood and into your pee (urine). This infection can happen quickly, or it can last for a long time. In most cases, it clears up with treatment and does not cause other problems.  Follow these instructions at home:  Medicines  · Take over-the-counter and prescription medicines only as told by your doctor.  · Take your antibiotic medicine as told by your doctor. Do not stop taking the medicine even if you start to feel better.  General instructions  · Drink enough fluid to keep your pee clear or pale yellow.  · Avoid caffeine, tea, and carbonated drinks.  · Pee (urinate) often. Avoid holding in pee for long periods of time.  · Pee before and after sex.  · After pooping (having a bowel movement), women should wipe from front to back. Use each tissue only once.  · Keep all follow-up visits as told by your doctor. This is important.  Contact a doctor if:  · You do not feel better after 2 days.  · Your symptoms get worse.  · You have a fever.  Get help right away if:  · You cannot take your medicine or drink fluids as told.  · You have chills and shaking.  · You throw up (vomit).  · You have very bad pain in your side (flank) or back.  · You feel very weak or you pass out (faint).  This information is not intended to replace advice given to you by your health care provider. Make sure you discuss any questions you have with your health care provider.  Document Released: 02/09/2004 Document Revised: 06/09/2015 Document Reviewed: 04/26/2014  Elsevier Interactive Patient Education © 2019 Elsevier Inc.

## 2018-04-07 NOTE — Discharge Summary (Addendum)
Physician Discharge Summary  Abigail Acosta MGQ:[id number] DOB: [birth year] DOA: 04/02/2018  PCP: System, Pcp Not In  Admit date: 04/02/2018 Discharge date: 04/07/2018  Admitted From: Home Disposition: Home  Recommendations for Outpatient Follow-up:  1. Follow up with PCP in 1-2 weeks 2. Please obtain CMP in 1-2 weeks 3. Please follow up on the following pending results: COVID-19 test  Home Health: No Equipment/Devices: None  Discharge Condition: Stable CODE STATUS: Full code Diet recommendation: Heart healthy diet  History of present illness:  Abigail Acosta a 78 y.o.femalewithhistory of hypertension presents to the ER after patient was not feeling well and felt nauseated with upset stomach. Patient states over the last 1 week patient has been having sinusitis symptoms with draining nose. Had gone to [place] last week to pick her great-grandson at the time to have gone to her restaurant.Denies any chest pain shortness of breath abdominal pain vomiting or diarrhea has been having nausea since last evening. Since patient has been having upper respiratory tract symptoms patient has been taking ibuprofen and Tylenol. Usually one Tylenol and 2 ibuprofen's per day for last 3 days.  ED Course:In the ER patient was found to be febrile with temperature of 101.2. Labs show elevated LFTs with AST 377 alkaline phosphatase 238 ALT 219 total bilirubin was normal. Lactate was normal. WBC count was 9.3 hemoglobin 12.9 and platelets 130 2K UA showing normal sinus rhythm. Days consistent with UTI. Creatinine is 1.1. Cute. CT abdomen pelvis liver. Shows features concerning for right-sided pyelonephritis and multiple lesions in the both kidneys which are not certain. Patient started on antibiotics. Since patient has fever chills with upper respiratory tract symptoms and flu panel being negative coronavirus COVID 19panel and respiratory viral panel was sent.   Hospital course:  Sepsis;  present on admission pyelonephritis/E. coli UTI Patient had presented with fevers, chills, tachypnea.  Urinalysis consistent with a UTI.  CT abdomen and pelvis consistent with pyelonephritis.  Urine cultures positive for E. coli UTI.  MRSA PCR negative.  Blood cultures pending with no growth to date.  Patient improving clinically.  Afebrile.  Normal white count.  Respiratory status improving.  IV Rocephin has been transitioned to oral Augmentin which patient is tolerating will treat for total of 14 days.   Viral upper respiratory tract infection Patient had presented with fevers, upper respiratory symptoms.  Due to pandemic of COVID 19 lab test has been sent and patient deemed low risk patient.  Influenza PCR negative.  Respiratory viral panel positive for coronavirus NL 68.  Patient with no significant hypoxia O2 requirements.  Fever curve trended down.  Continue Flonase, Claritin, albuterol as needed.   Addendum 05/05/2018: COVID-19 test returned negative.  COVID-19 ruled out.  Transaminitis Questionable etiology.  LFTs trending down.  CT abdomen and pelvis with no lesions noted on the liver.  Acute hepatitis panel negative.  EBV panel negative for any acute infection.  EBV IgG elevated likely indicating prior infection.  Recommend repeat CMP in 1-2 weeks to further follow liver function.  Hypophosphatemia Repleted during hospitalization.  Hypertension Resume home captopril HCTZ  Chronic kidney disease stage III Creatinine stable.    Hypokalemia Repleted during hospitalization, stable at time of discharge.   Discharge Diagnoses:  Principal Problem:   Acute pyelonephritis Active Problems:   Elevated LFTs   CKD (chronic kidney disease) stage 3, GFR 30-59 ml/min (HCC)   Essential hypertension   E-coli UTI   Hypophosphatemia   Upper respiratory tract infection   Suspected Covid-19 Virus Infection  Discharge Instructions  Discharge Instructions    Call MD for:   difficulty breathing, headache or visual disturbances   Complete by:  As directed    Call MD for:  persistant dizziness or light-headedness   Complete by:  As directed    Call MD for:  persistant nausea and vomiting   Complete by:  As directed    Call MD for:  severe uncontrolled pain   Complete by:  As directed    Call MD for:  temperature >100.4   Complete by:  As directed    Diet - low sodium heart healthy   Complete by:  As directed    Increase activity slowly   Complete by:  As directed      Allergies as of 04/07/2018   No Known Allergies     Medication List    STOP taking these medications   HYDROcodone-acetaminophen 5-325 MG tablet Commonly known as:  Norco     TAKE these medications   amoxicillin-clavulanate 875-125 MG tablet Commonly known as:  AUGMENTIN Take 1 tablet by mouth every 12 (twelve) hours for 10 days.   BIOTIN PO Take 1 tablet by mouth daily.   captopril-hydrochlorothiazide 25-25 MG tablet Commonly known as:  CAPOZIDE Take 0.5 tablets by mouth every morning.   cetirizine 10 MG tablet Commonly known as:  ZYRTEC Take 10 mg by mouth daily as needed for allergies.   fluticasone 50 MCG/ACT nasal spray Commonly known as:  FLONASE Place 2 sprays into both nostrils daily. Start taking on:  April 08, 2018   sodium chloride 0.65 % Soln nasal spray Commonly known as:  OCEAN Place 1 spray into both nostrils as needed for congestion.      Follow-up Information    PCP Follow up.   Why:  Need to follow-up with PCP/establish care with repeat LFT's in 2 weeks         No Known Allergies  Consultations:  none   Procedures/Studies: Ct Abdomen Pelvis W Contrast  Result Date: 04/03/2018 CLINICAL DATA:  Fever, chills, UTI, transaminitis, wbc's 9.3, GFR 44, rbc's and wbc's in urine, hx of vaginal prolapse repair, bilateral inguinal hernia repair, cystoscopy, appendectomy, rectocele and cystocele repair, and hysterectomy, no prev ct a/p in [facility], prev ct a/p [facility] 03/12/16 EXAM: CT ABDOMEN AND PELVIS WITH CONTRAST TECHNIQUE: Multidetector CT imaging of the abdomen and pelvis was performed using the standard protocol following bolus administration of intravenous contrast. CONTRAST:  54mL ISOVUE-300 IOPAMIDOL (ISOVUE-300) INJECTION 61% COMPARISON:  None. FINDINGS: Lower chest: No acute abnormality. Hepatobiliary: Liver borderline enlarged. No masses or focal lesions. Normal gallbladder. No bile duct dilation. Pancreas: Unremarkable. No pancreatic ductal dilatation or surrounding inflammatory changes. Spleen: Normal in size without focal abnormality. Adrenals/Urinary Tract: No adrenal masses. Multiple small low-attenuation renal masses, too small to fully characterize, likely cysts. Fat density mass, posterior lower pole the right kidney, 1.8 cm, consistent with an angiomyolipoma. Subtle relative decreased attenuation in the upper pole the right kidney best seen on the delayed view. There is hazy perinephric stranding on the right. There is mild thickening of the posterior pararenal fascia. No perirenal fluid collection. There are no renal stones. No hydronephrosis. Ureters are normal in course and in caliber. Bladder is unremarkable. Stomach/Bowel: Stomach is unremarkable. Small bowel and colon are normal in caliber. No wall thickening or inflammation. Vascular/Lymphatic: Mild aortic atherosclerosis. No aneurysm. No other vascular abnormality. No enlarged lymph nodes. Reproductive: Status post hysterectomy. No adnexal masses. Other: No ascites.  No abdominal wall hernia. Musculoskeletal: No fracture or acute finding. No osteoblastic or osteolytic lesions. IMPRESSION: 1. There is right perinephric haziness consistent with inflammation. Subtle area of relative hypoattenuation noted in the upper pole the right kidney. These findings are consistent with right-sided pyelonephritis. No evidence of an abscess. No hydronephrosis or evidence of obstructive  uropathy. 2. Multiple low-density renal lesions seen bilaterally that are too small to fully characterize but are likely cysts. Right lower pole renal angiomyolipoma. 3. No other acute findings. 4. Mild aortic atherosclerosis. Electronically Signed   By: Lajean Manes M.D.   On: 04/03/2018 03:55   Dg Chest Portable 1 View  Result Date: 04/03/2018 CLINICAL DATA:  Sinus infection and cough for 5 days. Fever tonight. EXAM: PORTABLE CHEST 1 VIEW COMPARISON:  04/16/2013 FINDINGS: Cardiac silhouette is normal in size. No mediastinal or hilar masses. There is no evidence of adenopathy. Clear lungs.  No pleural effusion or pneumothorax. Skeletal structures are grossly intact. IMPRESSION: No active disease. Electronically Signed   By: Lajean Manes M.D.   On: 04/03/2018 00:17    (Echo, Carotid, EGD, Colonoscopy, ERCP)    Subjective:   Discharge Exam: Vitals:   04/07/18 0501 04/07/18 1346  BP: 139/72 137/64  Pulse: 76 67  Resp: 18 17  Temp: 98.3 F (36.8 C) 97.7 F (36.5 C)  SpO2: 97% 99%   Vitals:   04/06/18 1410 04/06/18 2053 04/07/18 0501 04/07/18 1346  BP: (!) 130/58 (!) 147/65 139/72 137/64  Pulse: 82 73 76 67  Resp: 16 18 18 17   Temp: 97.8 F (36.6 C) 98.1 F (36.7 C) 98.3 F (36.8 C) 97.7 F (36.5 C)  TempSrc: Oral Oral Oral Oral  SpO2: 96% 100% 97% 99%  Weight:      Height:        General: Pt is alert, awake, not in acute distress Cardiovascular: RRR, S1/S2 +, no rubs, no gallops Respiratory: CTA bilaterally, no wheezing, no rhonchi Abdominal: Soft, NT, ND, bowel sounds + Extremities: no edema, no cyanosis    The results of significant diagnostics from this hospitalization (including imaging, microbiology, ancillary and laboratory) are listed below for reference.     Microbiology: Recent Results (from the past 240 hour(s))  Urine culture     Status: Abnormal   Collection Time: 04/03/18 12:29 AM  Result Value Ref Range Status   Specimen Description   Final     URINE, CLEAN CATCH Performed at [facility], [place] [address] [place], [place] [address]    Special Requests   Final    NONE Performed at [facility], [place] [address] [place], [place] [address]    Culture >=100,000 COLONIES/mL ESCHERICHIA COLI (A)  Final   Report Status 04/04/2018 FINAL  Final   Organism ID, Bacteria ESCHERICHIA COLI (A)  Final      Susceptibility   Escherichia coli - MIC*    AMPICILLIN >=32 RESISTANT Resistant     CEFAZOLIN <=4 SENSITIVE Sensitive     CEFTRIAXONE <=1 SENSITIVE Sensitive     CIPROFLOXACIN <=0.25 SENSITIVE Sensitive     GENTAMICIN <=1 SENSITIVE Sensitive     IMIPENEM <=0.25 SENSITIVE Sensitive     NITROFURANTOIN <=16 SENSITIVE Sensitive     TRIMETH/SULFA <=20 SENSITIVE Sensitive     AMPICILLIN/SULBACTAM 4 SENSITIVE Sensitive     PIP/TAZO <=4 SENSITIVE Sensitive     Extended ESBL NEGATIVE Sensitive     * >=100,000 COLONIES/mL ESCHERICHIA COLI  Culture, blood (routine x 2)  Status: None (Preliminary result)   Collection Time: 04/03/18 12:42 AM  Result Value Ref Range Status   Specimen Description   Final    BLOOD BLOOD RIGHT FOREARM Performed at [facility] [address] [place], [place] [address]    Special Requests   Final    BOTTLES DRAWN AEROBIC AND ANAEROBIC Blood Culture adequate volume Performed at [facility] [address], [place], [place] [address]    Culture   Final    NO GROWTH 4 DAYS Performed at [facility], [place] [address], [place], [place] [address]    Report Status PENDING  Incomplete  Culture, blood (routine x 2)     Status: None (Preliminary result)   Collection Time: 04/03/18 12:42 AM  Result Value Ref Range Status   Specimen Description   Final    BLOOD BLOOD LEFT FOREARM Performed at [facility] [address] [place], [place] [address]    Special Requests   Final    BOTTLES DRAWN AEROBIC AND ANAEROBIC  Blood Culture adequate volume Performed at [facility] [address] [place], [place] [address]    Culture   Final    NO GROWTH 4 DAYS Performed at [facility], [place] [address], [place], [place] [address]    Report Status PENDING  Incomplete  Respiratory Panel by PCR     Status: Abnormal   Collection Time: 04/03/18 12:57 AM  Result Value Ref Range Status   Adenovirus NOT DETECTED NOT DETECTED Final   Coronavirus 229E NOT DETECTED NOT DETECTED Final    Comment: (NOTE) The Coronavirus on the Respiratory Panel, DOES NOT test for the novel  Coronavirus (2019 nCoV)    Coronavirus HKU1 NOT DETECTED NOT DETECTED Final   Coronavirus NL63 DETECTED (A) NOT DETECTED Final   Coronavirus OC43 NOT DETECTED NOT DETECTED Final   Metapneumovirus NOT DETECTED NOT DETECTED Final   Rhinovirus / Enterovirus NOT DETECTED NOT DETECTED Final   Influenza A NOT DETECTED NOT DETECTED Final   Influenza B NOT DETECTED NOT DETECTED Final   Parainfluenza Virus 1 NOT DETECTED NOT DETECTED Final   Parainfluenza Virus 2 NOT DETECTED NOT DETECTED Final   Parainfluenza Virus 3 NOT DETECTED NOT DETECTED Final   Parainfluenza Virus 4 NOT DETECTED NOT DETECTED Final   Respiratory Syncytial Virus NOT DETECTED NOT DETECTED Final   Bordetella pertussis NOT DETECTED NOT DETECTED Final   Chlamydophila pneumoniae NOT DETECTED NOT DETECTED Final   Mycoplasma pneumoniae NOT DETECTED NOT DETECTED Final    Comment: Performed at [facility], [place] [address], [place], [place] [address]  MRSA PCR Screening     Status: None   Collection Time: 04/03/18  8:16 AM  Result Value Ref Range Status   MRSA by PCR NEGATIVE NEGATIVE Final    Comment:        The GeneXpert MRSA Assay (FDA approved for NASAL specimens only), is one component of a comprehensive MRSA colonization surveillance program. It is not intended to diagnose MRSA infection nor to guide or monitor treatment for MRSA  infections. Performed at [facility], [place] [address] [place],  [address]      Labs: BNP (last 3 results) No results for input(s): BNP in the last 8760 hours. Basic Metabolic Panel: Recent Labs  Lab 04/03/18 0042 04/03/18 0838 04/04/18 0239 04/05/18 0303 04/06/18 0501  NA 140 143 139 138 139  K 3.2* 3.0* 3.8 3.5 4.1  CL 110 115* 112* 109 108  CO2 21* 21* 19* 22 22  GLUCOSE 130* 107* 98 99 89  BUN 27* 19 16 18 22   CREATININE 1.19* 1.02* 0.86 0.96 0.93  CALCIUM 8.7* 8.5* 7.9* 8.2* 8.5*  MG  --  1.8 2.7*  --   --   PHOS  --   --  2.2* 3.9  --    Liver Function Tests: Recent Labs  Lab 04/03/18 0042 04/04/18 0239 04/05/18 0303 04/06/18 0501  AST 377* 187* 86* 41  ALT 219* 164* 123* 90*  ALKPHOS 238* 210* 216* 186*  BILITOT 0.5 0.5 0.3 0.4  PROT 7.2 6.0* 6.0* 6.3*  ALBUMIN 3.2* 2.5* 2.5* 2.7*   No results for input(s): LIPASE, AMYLASE in the last 168 hours. No results for input(s): AMMONIA in the last 168 hours. CBC: Recent Labs  Lab 04/03/18 0042 04/03/18 0838 04/04/18 0239 04/05/18 0303  WBC 9.3 9.4 7.8 6.8  NEUTROABS 7.4 7.5 5.3 3.9  HGB 12.9 13.1 12.3 12.2  HCT 39.3 41.1 37.8 37.4  MCV 91.0 93.0 92.6 92.3  PLT 172 183 165 200   Cardiac Enzymes: No results for input(s): CKTOTAL, CKMB, CKMBINDEX, TROPONINI in the last 168 hours. BNP: Invalid input(s): POCBNP CBG: No results for input(s): GLUCAP in the last 168 hours. D-Dimer No results for input(s): DDIMER in the last 72 hours. Hgb A1c No results for input(s): HGBA1C in the last 72 hours. Lipid Profile No results for input(s): CHOL, HDL, LDLCALC, TRIG, CHOLHDL, LDLDIRECT in the last 72 hours. Thyroid function studies No results for input(s): TSH, T4TOTAL, T3FREE, THYROIDAB in the last 72 hours.  Invalid input(s): FREET3 Anemia work up No results for input(s): VITAMINB12, FOLATE, FERRITIN, TIBC, IRON, RETICCTPCT in the last 72 hours. Urinalysis    Component Value  Date/Time   COLORURINE YELLOW 04/03/2018 0029   APPEARANCEUR CLOUDY (A) 04/03/2018 0029   LABSPEC 1.021 04/03/2018 0029   PHURINE 5.0 04/03/2018 0029   GLUCOSEU NEGATIVE 04/03/2018 0029   HGBUR LARGE (A) 04/03/2018 0029   BILIRUBINUR NEGATIVE 04/03/2018 0029   KETONESUR NEGATIVE 04/03/2018 0029   PROTEINUR 100 (A) 04/03/2018 0029   NITRITE POSITIVE (A) 04/03/2018 0029   LEUKOCYTESUR LARGE (A) 04/03/2018 0029   Sepsis Labs Invalid input(s): PROCALCITONIN,  WBC,  LACTICIDVEN Microbiology Recent Results (from the past 240 hour(s))  Urine culture     Status: Abnormal   Collection Time: 04/03/18 12:29 AM  Result Value Ref Range Status   Specimen Description   Final    URINE, CLEAN CATCH Performed at [facility], [place] [address], [place], [place] [address]    Special Requests   Final    NONE Performed at [facility], [place] [address], [place],  [address]    Culture >=100,000 COLONIES/mL ESCHERICHIA COLI (A)  Final   Report Status 04/04/2018 FINAL  Final   Organism ID, Bacteria ESCHERICHIA COLI (A)  Final      Susceptibility   Escherichia coli - MIC*    AMPICILLIN >=32 RESISTANT Resistant     CEFAZOLIN <=4 SENSITIVE Sensitive     CEFTRIAXONE <=1 SENSITIVE Sensitive     CIPROFLOXACIN <=0.25 SENSITIVE Sensitive     GENTAMICIN <=1 SENSITIVE Sensitive     IMIPENEM <=0.25 SENSITIVE Sensitive     NITROFURANTOIN <=16 SENSITIVE Sensitive     TRIMETH/SULFA <=20 SENSITIVE Sensitive     AMPICILLIN/SULBACTAM 4 SENSITIVE Sensitive     PIP/TAZO <=4 SENSITIVE Sensitive     Extended ESBL NEGATIVE  Sensitive     * >=100,000 COLONIES/mL ESCHERICHIA COLI  Culture, blood (routine x 2)     Status: None (Preliminary result)   Collection Time: 04/03/18 12:42 AM  Result Value Ref Range Status   Specimen Description   Final    BLOOD BLOOD RIGHT FOREARM Performed at [facility] [address] [place], [place] [address]    Special  Requests   Final    BOTTLES DRAWN AEROBIC AND ANAEROBIC Blood Culture adequate volume Performed at [facility] [address], [place], [place] [address]    Culture   Final    NO GROWTH 4 DAYS Performed at [facility], [place] [address], [place], [place] [address]    Report Status PENDING  Incomplete  Culture, blood (routine x 2)     Status: None (Preliminary result)   Collection Time: 04/03/18 12:42 AM  Result Value Ref Range Status   Specimen Description   Final    BLOOD BLOOD LEFT FOREARM Performed at [facility] [address] [place], [place] [address]    Special Requests   Final    BOTTLES DRAWN AEROBIC AND ANAEROBIC Blood Culture adequate volume Performed at [facility] [address], [place], [place] [address]    Culture   Final    NO GROWTH 4 DAYS Performed at [facility], [place] [address], [place], [place] [address]    Report Status PENDING  Incomplete  Respiratory Panel by PCR     Status: Abnormal   Collection Time: 04/03/18 12:57 AM  Result Value Ref Range Status   Adenovirus NOT DETECTED NOT DETECTED Final   Coronavirus 229E NOT DETECTED NOT DETECTED Final    Comment: (NOTE) The Coronavirus on the Respiratory Panel, DOES NOT test for the novel  Coronavirus (2019 nCoV)    Coronavirus HKU1 NOT DETECTED NOT DETECTED Final   Coronavirus NL63 DETECTED (A) NOT DETECTED Final   Coronavirus OC43 NOT DETECTED NOT DETECTED Final   Metapneumovirus NOT DETECTED NOT DETECTED Final   Rhinovirus / Enterovirus NOT DETECTED NOT DETECTED Final   Influenza A NOT DETECTED NOT DETECTED Final   Influenza B NOT DETECTED NOT DETECTED Final   Parainfluenza Virus 1 NOT DETECTED NOT DETECTED Final   Parainfluenza Virus 2 NOT DETECTED NOT DETECTED Final   Parainfluenza Virus 3 NOT DETECTED NOT DETECTED Final   Parainfluenza Virus 4 NOT DETECTED NOT DETECTED Final   Respiratory Syncytial Virus NOT DETECTED NOT  DETECTED Final   Bordetella pertussis NOT DETECTED NOT DETECTED Final   Chlamydophila pneumoniae NOT DETECTED NOT DETECTED Final   Mycoplasma pneumoniae NOT DETECTED NOT DETECTED Final    Comment: Performed at [facility], [place] [address], [place], [place] [address]  MRSA PCR Screening     Status: None   Collection Time: 04/03/18  8:16 AM  Result Value Ref Range Status   MRSA by PCR NEGATIVE NEGATIVE Final    Comment:        The GeneXpert MRSA Assay (FDA approved for NASAL specimens only), is one component of a comprehensive MRSA colonization surveillance program. It is not intended to diagnose MRSA infection nor to guide or monitor treatment for MRSA infections. Performed at [facility], [place] [address], [place], [place] [address]      Time coordinating discharge: Over 30 minutes  SIGNED:   Donnamarie Poag [place] DO  [facility] 04/07/2018, 2:38 PM

## 2018-04-07 NOTE — Progress Notes (Signed)
Reviewed d/c instructions w/ patient. Patient verbalized understanding of all instructions. All belongings w/ patient. Will d/c when ride arrives.

## 2018-04-07 NOTE — Care Management Important Message (Signed)
Important Message  Patient Details  Name: Marta Bouie MRN: [id number] Date of Birth: 08-16-1940   Medicare Important Message Given:  Yes    Kerin Salen 04/07/2018, 11:54 AMImportant Message  Patient Details  Name: Vale Peraza MRN: [id number] Date of Birth: 03/07/1940   Medicare Important Message Given:  Yes    Kerin Salen 04/07/2018, 11:54 AM

## 2018-04-08 LAB — CULTURE, BLOOD (ROUTINE X 2)
CULTURE: NO GROWTH
Culture: NO GROWTH
Special Requests: ADEQUATE
Special Requests: ADEQUATE

## 2018-04-09 LAB — NOVEL CORONAVIRUS, NAA (HOSPITAL ORDER, SEND-OUT TO REF LAB)

## 2018-04-09 LAB — NOVEL CORONAVIRUS, NAA (HOSP ORDER, SEND-OUT TO REF LAB; TAT 18-24 HRS): SARS-CoV-2, NAA: NOT DETECTED

## 2018-04-21 ENCOUNTER — Encounter: Payer: Self-pay | Admitting: Physician Assistant

## 2018-04-21 ENCOUNTER — Other Ambulatory Visit: Payer: Self-pay

## 2018-04-21 ENCOUNTER — Ambulatory Visit (INDEPENDENT_AMBULATORY_CARE_PROVIDER_SITE_OTHER): Payer: Medicare Other | Admitting: Physician Assistant

## 2018-04-21 VITALS — BP 202/84 | HR 71 | Temp 97.9°F | Resp 16 | Wt 124.6 lb

## 2018-04-21 DIAGNOSIS — N10 Acute tubulo-interstitial nephritis: Secondary | ICD-10-CM | POA: Diagnosis not present

## 2018-04-21 DIAGNOSIS — N183 Chronic kidney disease, stage 3 unspecified: Secondary | ICD-10-CM

## 2018-04-21 DIAGNOSIS — I10 Essential (primary) hypertension: Secondary | ICD-10-CM

## 2018-04-21 DIAGNOSIS — Z9889 Other specified postprocedural states: Secondary | ICD-10-CM

## 2018-04-21 DIAGNOSIS — R7989 Other specified abnormal findings of blood chemistry: Secondary | ICD-10-CM

## 2018-04-21 DIAGNOSIS — R945 Abnormal results of liver function studies: Secondary | ICD-10-CM | POA: Diagnosis not present

## 2018-04-21 DIAGNOSIS — Z8719 Personal history of other diseases of the digestive system: Secondary | ICD-10-CM | POA: Insufficient documentation

## 2018-04-21 DIAGNOSIS — J301 Allergic rhinitis due to pollen: Secondary | ICD-10-CM

## 2018-04-21 LAB — POCT URINALYSIS DIPSTICK
Bilirubin, UA: NEGATIVE
Blood, UA: NEGATIVE
Glucose, UA: NEGATIVE
Ketones, UA: NEGATIVE
Leukocytes, UA: NEGATIVE
Nitrite, UA: NEGATIVE
Protein, UA: NEGATIVE
Spec Grav, UA: <=1.005 — AB (ref 1.010–1.025)
Urobilinogen, UA: 0.2 E.U./dL
pH, UA: 6 (ref 5.0–8.0)

## 2018-04-21 MED ORDER — MONTELUKAST SODIUM 10 MG PO TABS: 10.0000 mg | ORAL_TABLET | Freq: Every day | ORAL | 3 refills | Status: DC

## 2018-04-21 MED ORDER — ALBUTEROL SULFATE HFA 108 (90 BASE) MCG/ACT IN AERS: 2.0000 | INHALATION_SPRAY | Freq: Four times a day (QID) | RESPIRATORY_TRACT | 0 refills | Status: DC | PRN

## 2018-04-21 MED ORDER — LISINOPRIL-HYDROCHLOROTHIAZIDE 10-12.5 MG PO TABS: 1.0000 | ORAL_TABLET | Freq: Every day | ORAL | 1 refills | Status: DC

## 2018-04-21 NOTE — Patient Instructions (Signed)
Hydrochlorothiazide, HCTZ; Lisinopril tablets What is this medicine? HYDROCHLOROTHIAZIDE; LISINOPRIL (hye droe klor oh THYE a zide; lyse IN oh pril) is a combination of a diuretic and an ACE inhibitor. It is used to treat high blood pressure. This medicine may be used for other purposes; ask your health care provider or pharmacist if you have questions. COMMON BRAND NAME(S): Prinzide, Zestoretic What should I tell my health care provider before I take this medicine? They need to know if you have any of these conditions: -bone marrow disease -decreased urine -heart or blood vessel disease -if you are on a special diet like a low salt diet -immune system problems, like lupus -kidney disease -liver disease -previous swelling of the tongue, face, or lips with difficulty breathing, difficulty swallowing, hoarseness, or tightening of the throat -recent heart attack or stroke -an unusual or allergic reaction to lisinopril, hydrochlorothiazide, sulfa drugs, other medicines, insect venom, foods, dyes, or preservatives -pregnant or trying to get pregnant -breast-feeding How should I use this medicine? Take this medicine by mouth with a glass of water. Follow the directions on the prescription label. You can take it with or without food. If it upsets your stomach, take it with food. Take your medicine at regular intervals. Do not take it more often than directed. Do not stop taking except on your doctor's advice. Talk to your pediatrician regarding the use of this medicine in children. Special care may be needed. Overdosage: If you think you have taken too much of this medicine contact a poison control center or emergency room at once. NOTE: This medicine is only for you. Do not share this medicine with others. What if I miss a dose? If you miss a dose, take it as soon as you can. If it is almost time for your next dose, take only that dose. Do not take double or extra doses. What may interact with  this medicine? Do not take this medication with any of the following medications: -sacubitril; valsartan This medicine may also interact with the following: -barbiturates like phenobarbital -blood pressure medicines -corticosteroids like prednisone -diabetic medications -diuretics, especially triamterene, spironolactone or amiloride -lithium -NSAIDs, medicines for pain and inflammation, like ibuprofen or naproxen -potassium salts or potassium supplements -prescription pain medicines -skeletal muscle relaxants like tubocurarine -some cholesterol lowering medications like cholestyramine or colestipol This list may not describe all possible interactions. Give your health care provider a list of all the medicines, herbs, non-prescription drugs, or dietary supplements you use. Also tell them if you smoke, drink alcohol, or use illegal drugs. Some items may interact with your medicine. What should I watch for while using this medicine? Visit your doctor or health care professional for regular checks on your progress. Check your blood pressure as directed. Ask your doctor or health care professional what your blood pressure should be and when you should contact him or her. Call your doctor or health care professional if you notice an irregular or fast heart beat. You must not get dehydrated. Ask your doctor or health care professional how much fluid you need to drink a day. Check with him or her if you get an attack of severe diarrhea, nausea and vomiting, or if you sweat a lot. The loss of too much body fluid can make it dangerous for you to take this medicine. Women should inform their doctor if they wish to become pregnant or think they might be pregnant. There is a potential for serious side effects to an unborn child. Talk to  your health care professional or pharmacist for more information. You may get drowsy or dizzy. Do not drive, use machinery, or do anything that needs mental alertness until  you know how this drug affects you. Do not stand or sit up quickly, especially if you are an older patient. This reduces the risk of dizzy or fainting spells. Alcohol can make you more drowsy and dizzy. Avoid alcoholic drinks. This medicine may affect your blood sugar level. If you have diabetes, check with your doctor or health care professional before changing the dose of your diabetic medicine. Avoid salt substitutes unless you are told otherwise by your doctor or health care professional. This medicine can make you more sensitive to the sun. Keep out of the sun. If you cannot avoid being in the sun, wear protective clothing and use sunscreen. Do not use sun lamps or tanning beds/booths. Do not treat yourself for coughs, colds, or pain while you are taking this medicine without asking your doctor or health care professional for advice. Some ingredients may increase your blood pressure. What side effects may I notice from receiving this medicine? Side effects that you should report to your doctor or health care professional as soon as possible: -changes in vision -confusion, dizziness, light headedness or fainting spells -decreased amount of urine passed -difficulty breathing or swallowing, hoarseness, or tightening of the throat -eye pain -fast or irregular heart beat, palpitations, or chest pain -muscle cramps -nausea and vomiting -persistent dry cough -redness, blistering, peeling or loosening of the skin, including inside the mouth -stomach pain -swelling of your face, lips, tongue, hands, or feet -unusual rash, bleeding or bruising, or pinpoint red spots on the skin -worsened gout pain -yellowing of the eyes or skin Side effects that usually do not require medical attention (report to your doctor or health care professional if they continue or are bothersome): -change in sex drive or performance -cough -headache This list may not describe all possible side effects. Call your doctor  for medical advice about side effects. You may report side effects to FDA at 1-800-FDA-1088. Where should I keep my medicine? Keep out of the reach of children. Store at room temperature between 20 and 25 degrees C (68 and 77 degrees F). Protect from moisture and excessive light. Keep container tightly closed. Throw away any unused medicine after the expiration date. NOTE: This sheet is a summary. It may not cover all possible information. If you have questions about this medicine, talk to your doctor, pharmacist, or health care provider.  2019 Elsevier/Gold Standard (2015-02-25 11:42:20)   Montelukast oral tablets What is this medicine? MONTELUKAST (mon te LOO kast) is used to prevent and treat the symptoms of asthma. It is also used to treat allergies. Do not use for an acute asthma attack. This medicine may be used for other purposes; ask your health care provider or pharmacist if you have questions. COMMON BRAND NAME(S): Singulair What should I tell my health care provider before I take this medicine? They need to know if you have any of these conditions: -liver disease -an unusual or allergic reaction to montelukast, other medicines, foods, dyes, or preservatives -pregnant or trying to get pregnant -breast-feeding How should I use this medicine? This medicine should be given by mouth. Follow the directions on the prescription label. Take this medicine at the same time every day. You may take this medicine with or without meals. Do not chew the tablets. Do not stop taking your medicine unless your doctor tells you  to. Talk to your pediatrician regarding the use of this medicine in children. Special care may be needed. While this drug may be prescribed for children as young as 40 years of age for selected conditions, precautions do apply. Overdosage: If you think you have taken too much of this medicine contact a poison control center or emergency room at once. NOTE: This medicine is only  for you. Do not share this medicine with others. What if I miss a dose? If you miss a dose, take it as soon as you can. If it is almost time for your next dose, take only that dose. Do not take double or extra doses. What may interact with this medicine? -anti-infectives like rifampin and rifabutin -medicines for seizures like phenytoin, phenobarbital, and carbamazepine This list may not describe all possible interactions. Give your health care provider a list of all the medicines, herbs, non-prescription drugs, or dietary supplements you use. Also tell them if you smoke, drink alcohol, or use illegal drugs. Some items may interact with your medicine. What should I watch for while using this medicine? Visit your doctor or health care professional for regular checks on your progress. Tell your doctor or health care professional if your allergy or asthma symptoms do not improve. Take your medicine even when you do not have symptoms. Do not stop taking any of your medicine(s) unless your doctor tells you to. If you have asthma, talk to your doctor about what to do in an acute asthma attack. Always have your inhaled rescue medicine for asthma attacks with you. Patients and their families should watch for new or worsening thoughts of suicide or depression. Also watch for sudden changes in feelings such as feeling anxious, agitated, panicky, irritable, hostile, aggressive, impulsive, severely restless, overly excited and hyperactive, or not being able to sleep. Any worsening of mood or thoughts of suicide or dying should be reported to your health care professional right away. What side effects may I notice from receiving this medicine? Side effects that you should report to your doctor or health care professional as soon as possible: -allergic reactions like skin rash or hives, or swelling of the face, lips, or tongue -breathing problems -changes in emotions or moods -confusion -depressed mood -fever or  infection -hallucinations -joint pain -painful lumps under the skin -pain, tingling, numbness in the hands or feet -redness, blistering, peeling, or loosening of the skin, including inside the mouth -restlessness -seizures -sleep walking -signs and symptoms of infection like fever; chills; cough; sore throat; flu-like illness -signs and symptoms of liver injury like dark yellow or brown urine; general ill feeling or flu-like symptoms; light-colored stools; loss of appetite; nausea; right upper belly pain; unusually weak or tired; yellowing of the eyes or skin -sinus pain or swelling -stuttering -suicidal thoughts or other mood changes -tremors -trouble sleeping -uncontrolled muscle movements -unusual bleeding or bruising -vivid or bad dreams Side effects that usually do not require medical attention (report to your doctor or health care professional if they continue or are bothersome): -dizziness -drowsiness -headache -runny nose -stomach upset -tiredness This list may not describe all possible side effects. Call your doctor for medical advice about side effects. You may report side effects to FDA at 1-800-FDA-1088. Where should I keep my medicine? Keep out of the reach of children. Store at room temperature between 15 and 30 degrees C (59 and 86 degrees F). Protect from light and moisture. Keep this medicine in the original bottle. Throw away any unused medicine  after the expiration date. NOTE: This sheet is a summary. It may not cover all possible information. If you have questions about this medicine, talk to your doctor, pharmacist, or health care provider.  2019 Elsevier/Gold Standard (2017-08-27 13:51:04)

## 2018-04-21 NOTE — Progress Notes (Signed)
Patient: Abigail Acosta, Female    DOB: 1940-08-15, 78 y.o.   MRN: [id number] Visit Date: 04/21/2018  Today's Provider: Mar Daring, [name]   Chief Complaint  Patient presents with  . Establish Care   Subjective:     Establish Care: Abigail Acosta is a 78 y.o. female who presents today to establish care and need to repeat LFT's from hospital admission -----------------------------------------------------------------  Follow up Hospitalization  Patient was admitted to [facility] on 04/02/2018 and discharged on 04/07/2018. She was treated for acute pyelonephritis, Elevated LFT's. CKD stage 3,Essential Hypertension, E-coli UTI,Hypophosphatetemia, Upper respiratory tract infection,suspected Covid-19 virus infection. Respiratory analysis showed it was not COVID 19 but was Coronavirus NL63.  She reports excellent compliance with treatment. She reports this condition is Improved. Patient reports that she is finished with the antibiotic. ------------------------------------------------------------------------------------ Patient reports that her blood pressure runs high when she comes to see doctors.Reports that yesterday her blood pressure was 126/61.  [place]. She reports overall she is healthy. She does have some mild HTN that is well controlled at home on BP medication. She is requesting a change to her medications for two reasons: 1) she has difficulty cutting in half due to small tablet and 2) cost.   She also reports in the past having a severe endometrial infection secondary to an IUD (when IUDs had first come out). She had to have a hysterectomy due to this. She then developed hernias and has had multiple hernia repairs. She also developed a cystocele that required repair. Following that she developed what sounds to be a rectal prolapse, even though patient could not remember what it  was called, and required rectal surgery. These were done by a urogynecologist from [place].    Review of Systems  Constitutional: Negative.   HENT: Positive for congestion, hearing loss (wears hearing aids), rhinorrhea and sinus pressure (chronic). Negative for ear pain, postnasal drip, sinus pain, sneezing, sore throat and tinnitus.   Eyes: Negative.   Respiratory: Positive for shortness of breath ("with Allergies" this weekend;only occurred when outside). Negative for cough, chest tightness and wheezing.   Cardiovascular: Negative for chest pain, palpitations and leg swelling.  Gastrointestinal: Negative.   Endocrine: Negative.   Genitourinary: Negative.   Musculoskeletal: Negative.   Skin: Negative.   Allergic/Immunologic: Negative.   Neurological: Negative.   Hematological: Negative.   Psychiatric/Behavioral: Negative.     Social History      She  reports that she has never smoked. She has never used smokeless tobacco. She reports that she does not drink alcohol or use drugs.       Social History   Socioeconomic History  . Marital status: Married    Spouse name: Not on file  . Number of children: Not on file  . Years of education: Not on file  . Highest education level: Not on file  Occupational History  . Not on file  Social Needs  . Financial resource strain: Not on file  . Food insecurity:    Worry: Not on file    Inability: Not on file  . Transportation needs:    Medical: Not on file    Non-medical: Not on file  Tobacco Use  . Smoking status: Never Smoker  . Smokeless tobacco: Never Used  Substance and Sexual Activity  . Alcohol use: No  . Drug use: No  . Sexual activity: Not Currently  Lifestyle  . Physical activity:    Days per week: Not on file    Minutes per session: Not on file  . Stress: Not on file  Relationships  . Social connections:    Talks on phone: Not on file    Gets together: Not on file    Attends religious service: Not on file    Active  member of club or organization: Not on file    Attends meetings of clubs or organizations: Not on file    Relationship status: Not on file  Other Topics Concern  . Not on file  Social History Narrative  . Not on file    Past Medical History:  Diagnosis Date  . Allergy   . Cataract   . Cataract    right eye-not mature  . Varicose veins      Patient Active Problem List   Diagnosis Date Noted  . Suspected Covid-19 Virus Infection 04/07/2018  . E-coli UTI 04/04/2018  . Hypophosphatemia 04/04/2018  . Upper respiratory tract infection   . Acute pyelonephritis 04/03/2018  . Elevated LFTs 04/03/2018  . CKD (chronic kidney disease) stage 3, GFR 30-59 ml/min (HCC) 04/03/2018  . Essential hypertension 04/03/2018  . Cystocele 04/28/2013    Past Surgical History:  Procedure Laterality Date  . ABDOMINAL HYSTERECTOMY    . ANTERIOR AND POSTERIOR REPAIR N/A 04/28/2013   Procedure: REPAIR CYSTOCELE AND RECTOCELE;  Surgeon: Reece Packer, MD;  Location: [facility];  Service: Urology;  Laterality: N/A;  . APPENDECTOMY    . CATARACT EXTRACTION Left   . CYSTOSCOPY N/A 04/28/2013   Procedure: CYSTOSCOPY;  Surgeon: Reece Packer, MD;  Location: [facility];  Service: Urology;  Laterality: N/A;  . HERNIA REPAIR Bilateral    inguinal  . VAGINAL PROLAPSE REPAIR N/A 04/28/2013   Procedure: VAULT PROLAPSE AND GRAFT  ;  Surgeon: Reece Packer, MD;  Location: [facility];  Service: Urology;  Laterality: N/A;  . VARICOSE VEIN SURGERY Right    stripping    Family History        Family Status  Relation Name Status  . Mother  Deceased        Her family history includes Cancer in her mother.      No Known Allergies   Current Outpatient Medications:  .  BIOTIN PO, Take 1 tablet by mouth daily., Disp: , Rfl:  .  captopril-hydrochlorothiazide (CAPOZIDE) 25-25 MG per tablet, Take 0.5 tablets by mouth every morning. , Disp: , Rfl:  .  cetirizine (ZYRTEC) 10 MG tablet, Take 10 mg by mouth daily  as needed for allergies. , Disp: , Rfl:  .  fluticasone (FLONASE) 50 MCG/ACT nasal spray, Place 2 sprays into both nostrils daily., Disp: 16 g, Rfl: 2 .  sodium chloride (OCEAN) 0.65 % SOLN nasal spray, Place 1 spray into both nostrils as needed for congestion., Disp: , Rfl:    Patient Care Team: Mar Daring, [name] as PCP - General (Family Medicine)    Objective:    Vitals: BP (!) 202/84 (BP Location: Left Arm, Patient Position: Sitting, Cuff Size: Normal) Comment: Reports that this am at home 179/81  Pulse 71   Temp 97.9 F (36.6 C) (Oral)   Resp 16   Wt 124 lb 9.6 oz (56.5 kg)   BMI 22.79 kg/m    Vitals:   04/21/18 1014  BP: (!) 202/84  Pulse: 71  Resp: 16  Temp: 97.9 F (36.6 C)  TempSrc: Oral  Weight:  124 lb 9.6 oz (56.5 kg)     Physical Exam Vitals signs reviewed.  Constitutional:      General: She is not in acute distress.    Appearance: Normal appearance. She is well-developed and normal weight. She is not ill-appearing or diaphoretic.  HENT:     Head: Normocephalic and atraumatic.     Right Ear: Tympanic membrane, ear canal and external ear normal. There is no impacted cerumen.     Left Ear: Tympanic membrane, ear canal and external ear normal. There is no impacted cerumen.     Nose: Nose normal. No congestion or rhinorrhea.     Mouth/Throat:     Mouth: Mucous membranes are moist.     Pharynx: No oropharyngeal exudate or posterior oropharyngeal erythema.  Eyes:     General: No scleral icterus.       Right eye: No discharge.        Left eye: No discharge.     Conjunctiva/sclera: Conjunctivae normal.     Pupils: Pupils are equal, round, and reactive to light.  Neck:     Musculoskeletal: Normal range of motion and neck supple.     Thyroid: No thyromegaly.     Vascular: No JVD.     Trachea: No tracheal deviation.  Cardiovascular:     Rate and Rhythm: Normal rate and regular rhythm.     Heart sounds: Normal heart sounds. No murmur. No friction rub.  No gallop.   Pulmonary:     Effort: Pulmonary effort is normal. No respiratory distress.     Breath sounds: Normal breath sounds. No wheezing or rales.  Chest:     Chest wall: No tenderness.  Abdominal:     General: Bowel sounds are normal. There is no distension.     Palpations: Abdomen is soft. There is no mass.     Tenderness: There is no abdominal tenderness. There is no guarding or rebound.  Musculoskeletal: Normal range of motion.        General: No tenderness.  Lymphadenopathy:     Cervical: No cervical adenopathy.  Skin:    General: Skin is warm and dry.     Findings: No rash.  Neurological:     General: No focal deficit present.     Mental Status: She is alert and oriented to person, place, and time.     Cranial Nerves: No cranial nerve deficit.     Motor: No weakness.     Coordination: Coordination normal.     Gait: Gait normal.  Psychiatric:        Mood and Affect: Mood normal.        Behavior: Behavior normal.        Thought Content: Thought content normal.        Judgment: Judgment normal.      Depression Screen PHQ 2/9 Scores 04/21/2018  PHQ - 2 Score 0       Assessment & Plan:     Routine Health Maintenance and Physical Exam  Exercise Activities and Dietary recommendations Goals   None      There is no immunization history on file for this patient.  Health Maintenance  Topic Date Due  . TETANUS/TDAP  08/10/1960  . DEXA SCAN  08/11/2006  . PNA vac Low Risk Adult (1 of 2 - PCV13) 08/11/2006  . INFLUENZA VACCINE  08/16/2018     Discussed health benefits of physical activity, and encouraged her to engage in regular exercise appropriate for her age and  condition.    1. White coat syndrome with diagnosis of hypertension BP elevated today but she states that this has been common for her BP to be very high in the doctor's office. She does check her BP daily at home. Had been on captopril-hctz 25-25mg . Reports cost and hard time cutting pill in  half and would like to change. Medication changed to lisinopril-hctz 10-12.5mg  as below. She will monitor BP at home. I will see her back in 3 months for CPE.  - CBC w/Diff/Platelet - Comprehensive Metabolic Panel (CMET) - Phosphorus  2. Benign essential HTN See above medical treatment plan. - lisinopril-hydrochlorothiazide (ZESTORETIC) 10-12.5 MG tablet; Take 1 tablet by mouth daily.  Dispense: 90 tablet; Refill: 1 - CBC w/Diff/Platelet - Comprehensive Metabolic Panel (CMET) - Phosphorus  3. Elevated LFTs Acutely elevated in hospital. Suspected due to acute illness. Will recheck labs as below. I will f/u pending results.  - CBC w/Diff/Platelet - Comprehensive Metabolic Panel (CMET) - Phosphorus  4. Acute pyelonephritis UA normal today. Will check labs as below and f/u pending results. - POCT urinalysis dipstick - CBC w/Diff/Platelet - Comprehensive Metabolic Panel (CMET) - Phosphorus  5. Hypophosphatemia Was low in hospital most likely secondary to pyelonephritis. Will check labs as below and f/u pending results. - CBC w/Diff/Platelet - Comprehensive Metabolic Panel (CMET) - Phosphorus  6. Seasonal allergic rhinitis due to pollen Worsening due to location. Will add montelukast as below. Continue Zyrtec. Discussed using flonase sensimist instead of flonase due to side effects. Albuterol inhaler also given as below for reactive airway.  - montelukast (SINGULAIR) 10 MG tablet; Take 1 tablet (10 mg total) by mouth at bedtime.  Dispense: 30 tablet; Refill: 3 - CBC w/Diff/Platelet - Comprehensive Metabolic Panel (CMET) - Phosphorus - albuterol (PROVENTIL HFA;VENTOLIN HFA) 108 (90 Base) MCG/ACT inhaler; Inhale 2 puffs into the lungs every 6 (six) hours as needed for wheezing or shortness of breath.  Dispense: 1 Inhaler; Refill: 0  7. CKD (chronic kidney disease) stage 3, GFR 30-59 ml/min (HCC) Will check labs as below and f/u pending results. - CBC w/Diff/Platelet - Comprehensive  Metabolic Panel (CMET) - Phosphorus  --------------------------------------------------------------------    Mar Daring, [name]  [facility]

## 2018-04-22 LAB — CBC WITH DIFFERENTIAL/PLATELET
Basophils Absolute: 0 10*3/uL (ref 0.0–0.2)
Basos: 1 %
EOS (ABSOLUTE): 0.2 10*3/uL (ref 0.0–0.4)
Eos: 3 %
Hematocrit: 39.8 % (ref 34.0–46.6)
Hemoglobin: 13.1 g/dL (ref 11.1–15.9)
Immature Grans (Abs): 0 10*3/uL (ref 0.0–0.1)
Immature Granulocytes: 0 %
Lymphocytes Absolute: 1.9 10*3/uL (ref 0.7–3.1)
Lymphs: 38 %
MCH: 30.2 pg (ref 26.6–33.0)
MCHC: 32.9 g/dL (ref 31.5–35.7)
MCV: 92 fL (ref 79–97)
Monocytes Absolute: 0.3 10*3/uL (ref 0.1–0.9)
Monocytes: 7 %
Neutrophils Absolute: 2.6 10*3/uL (ref 1.4–7.0)
Neutrophils: 51 %
Platelets: 260 10*3/uL (ref 150–450)
RBC: 4.34 x10E6/uL (ref 3.77–5.28)
RDW: 12.7 % (ref 11.7–15.4)
WBC: 5 10*3/uL (ref 3.4–10.8)

## 2018-04-22 LAB — COMPREHENSIVE METABOLIC PANEL
ALT: 16 IU/L (ref 0–32)
AST: 24 IU/L (ref 0–40)
Albumin/Globulin Ratio: 1.9 (ref 1.2–2.2)
Albumin: 4.3 g/dL (ref 3.7–4.7)
Alkaline Phosphatase: 103 IU/L (ref 39–117)
BUN/Creatinine Ratio: 20 (ref 12–28)
BUN: 21 mg/dL (ref 8–27)
Bilirubin Total: 0.3 mg/dL (ref 0.0–1.2)
CO2: 24 mmol/L (ref 20–29)
Calcium: 9.5 mg/dL (ref 8.7–10.3)
Chloride: 103 mmol/L (ref 96–106)
Creatinine, Ser: 1.03 mg/dL — ABNORMAL HIGH (ref 0.57–1.00)
GFR calc Af Amer: 61 mL/min/{1.73_m2} (ref >59)
GFR calc non Af Amer: 53 mL/min/{1.73_m2} — ABNORMAL LOW (ref >59)
Globulin, Total: 2.3 g/dL (ref 1.5–4.5)
Glucose: 83 mg/dL (ref 65–99)
Potassium: 4.1 mmol/L (ref 3.5–5.2)
Sodium: 141 mmol/L (ref 134–144)
Total Protein: 6.6 g/dL (ref 6.0–8.5)

## 2018-04-22 LAB — PHOSPHORUS: Phosphorus: 3.2 mg/dL (ref 3.0–4.3)

## 2018-04-23 ENCOUNTER — Telehealth: Payer: Self-pay

## 2018-04-23 NOTE — Telephone Encounter (Signed)
Patient was returning your call, she wanted CMA to know that she has already reviewed over her results in my chart. KW

## 2018-04-23 NOTE — Telephone Encounter (Signed)
-----   Message from Mar Daring, [name] sent at 04/23/2018  8:23 AM EDT ----- Blood count is normal. Kidney function slightly increased. Make sure to push fluids. Liver enzymes now normal. Calcium, sodium and potassium all normal. Phosphorus remains normal.

## 2018-04-23 NOTE — Telephone Encounter (Signed)
Tried calling patient. Left message to call back. 

## 2018-07-24 NOTE — Progress Notes (Signed)
Patient: Abigail Acosta, Female    DOB: 11-12-1940, 78 y.o.   MRN: [id number] Visit Date: 07/25/2018  Today's Provider: Mar Daring, [name]   Chief Complaint  Patient presents with  . Annual Exam   Subjective:     Annual wellness visit Abigail Acosta is a 78 y.o. female. She feels well. She reports exercising yes/walking. She reports she is sleeping well. -----------------------------------------------------------   Review of Systems  Constitutional: Negative.   HENT: Negative.   Eyes: Negative.   Respiratory: Negative.   Cardiovascular: Negative.   Gastrointestinal: Negative.   Endocrine: Negative.   Genitourinary: Negative.   Musculoskeletal: Negative.   Skin: Negative.   Allergic/Immunologic: Negative.   Neurological: Negative.   Hematological: Negative.   Psychiatric/Behavioral: Negative.     Social History   Socioeconomic History  . Marital status: Married    Spouse name: Not on file  . Number of children: Not on file  . Years of education: Not on file  . Highest education level: Not on file  Occupational History  . Not on file  Social Needs  . Financial resource strain: Not on file  . Food insecurity    Worry: Not on file    Inability: Not on file  . Transportation needs    Medical: Not on file    Non-medical: Not on file  Tobacco Use  . Smoking status: Never Smoker  . Smokeless tobacco: Never Used  Substance and Sexual Activity  . Alcohol use: No  . Drug use: No  . Sexual activity: Not Currently  Lifestyle  . Physical activity    Days per week: Not on file    Minutes per session: Not on file  . Stress: Not on file  Relationships  . Social Herbalist on phone: Not on file    Gets together: Not on file    Attends religious service: Not on file    Active member of club or organization: Not on file    Attends meetings of clubs or organizations: Not on file    Relationship status: Not on file  . Intimate partner  violence    Fear of current or ex partner: Not on file    Emotionally abused: Not on file    Physically abused: Not on file    Forced sexual activity: Not on file  Other Topics Concern  . Not on file  Social History Narrative  . Not on file    Past Medical History:  Diagnosis Date  . Allergy   . Cataract   . Cataract    right eye-not mature  . Varicose veins      Patient Active Problem List   Diagnosis Date Noted  . S/P hernia surgery 04/21/2018  . Hypophosphatemia 04/04/2018  . Acute pyelonephritis 04/03/2018  . Elevated LFTs 04/03/2018  . CKD (chronic kidney disease) stage 3, GFR 30-59 ml/min (HCC) 04/03/2018  . Essential hypertension 04/03/2018  . Cystocele 04/28/2013    Past Surgical History:  Procedure Laterality Date  . ABDOMINAL HYSTERECTOMY    . ANTERIOR AND POSTERIOR REPAIR N/A 04/28/2013   Procedure: REPAIR CYSTOCELE AND RECTOCELE;  Surgeon: Reece Packer, MD;  Location: [facility];  Service: Urology;  Laterality: N/A;  . APPENDECTOMY    . CATARACT EXTRACTION Left   . CYSTOSCOPY N/A 04/28/2013   Procedure: CYSTOSCOPY;  Surgeon: Reece Packer, MD;  Location: [facility];  Service: Urology;  Laterality: N/A;  . HERNIA REPAIR Bilateral  inguinal  . VAGINAL PROLAPSE REPAIR N/A 04/28/2013   Procedure: VAULT PROLAPSE AND GRAFT  ;  Surgeon: Reece Packer, MD;  Location: [facility];  Service: Urology;  Laterality: N/A;  . VARICOSE VEIN SURGERY Right    stripping    Her family history includes Cancer in her mother. She was adopted.   Current Outpatient Medications:  .  BIOTIN PO, Take 1 tablet by mouth daily., Disp: , Rfl:  .  cetirizine (ZYRTEC) 10 MG tablet, Take 10 mg by mouth daily as needed for allergies. , Disp: , Rfl:  .  lisinopril-hydrochlorothiazide (ZESTORETIC) 10-12.5 MG tablet, Take 1 tablet by mouth daily., Disp: 90 tablet, Rfl: 1 .  albuterol (PROVENTIL HFA;VENTOLIN HFA) 108 (90 Base) MCG/ACT inhaler, Inhale 2 puffs into the lungs every 6 (six)  hours as needed for wheezing or shortness of breath. (Patient not taking: Reported on 07/25/2018), Disp: 1 Inhaler, Rfl: 0 .  fluticasone (FLONASE) 50 MCG/ACT nasal spray, Place 2 sprays into both nostrils daily. (Patient not taking: Reported on 07/25/2018), Disp: 16 g, Rfl: 2 .  montelukast (SINGULAIR) 10 MG tablet, Take 1 tablet (10 mg total) by mouth at bedtime. (Patient not taking: Reported on 07/25/2018), Disp: 30 tablet, Rfl: 3 .  sodium chloride (OCEAN) 0.65 % SOLN nasal spray, Place 1 spray into both nostrils as needed for congestion., Disp: , Rfl:   Patient Care Team: Mar Daring, [name] as PCP - General (Family Medicine)    Objective:    Vitals: BP (!) 181/74 (BP Location: Left Arm, Patient Position: Sitting, Cuff Size: Normal)   Pulse 65   Temp 97.7 F (36.5 C) (Oral)   Resp 16   Ht 5\' 2"  (1.575 m)   Wt 128 lb (58.1 kg)   SpO2 99%   BMI 23.41 kg/m   Physical Exam Vitals signs reviewed.  Constitutional:      General: She is not in acute distress.    Appearance: Normal appearance. She is well-developed and normal weight. She is not ill-appearing or diaphoretic.  HENT:     Head: Normocephalic and atraumatic.     Right Ear: Tympanic membrane, ear canal and external ear normal.     Left Ear: Tympanic membrane, ear canal and external ear normal.     Nose: Nose normal.     Mouth/Throat:     Mouth: Mucous membranes are moist.     Pharynx: No oropharyngeal exudate or posterior oropharyngeal erythema.  Eyes:     General: No scleral icterus.       Right eye: No discharge.        Left eye: No discharge.     Extraocular Movements: Extraocular movements intact.     Conjunctiva/sclera: Conjunctivae normal.     Pupils: Pupils are equal, round, and reactive to light.  Neck:     Musculoskeletal: Normal range of motion and neck supple.     Thyroid: No thyromegaly.     Vascular: No carotid bruit or JVD.     Trachea: No tracheal deviation.  Cardiovascular:     Rate and  Rhythm: Normal rate and regular rhythm.     Pulses: Normal pulses.     Heart sounds: Murmur present. Systolic (systolic click c/w mitral prolapse) murmur present. No friction rub. No gallop.   Pulmonary:     Effort: Pulmonary effort is normal. No respiratory distress.     Breath sounds: Normal breath sounds. No wheezing or rales.  Chest:     Chest wall: No tenderness.  Abdominal:     General: Bowel sounds are normal. There is no distension.     Palpations: Abdomen is soft. There is no mass.     Tenderness: There is no abdominal tenderness. There is no guarding or rebound.  Musculoskeletal: Normal range of motion.        General: No tenderness.  Lymphadenopathy:     Cervical: No cervical adenopathy.  Skin:    General: Skin is warm and dry.     Findings: No rash.  Neurological:     Mental Status: She is alert and oriented to person, place, and time.  Psychiatric:        Behavior: Behavior normal.        Thought Content: Thought content normal.        Judgment: Judgment normal.     Activities of Daily Living In your present state of health, do you have any difficulty performing the following activities: 07/25/2018 04/21/2018  Hearing? Y Y  Comment - "hearing aids'  Vision? N N  Difficulty concentrating or making decisions? N N  Walking or climbing stairs? N N  Dressing or bathing? N N  Doing errands, shopping? N N  Some recent data might be hidden    Fall Risk Assessment Fall Risk  07/25/2018  Falls in the past year? 0  Number falls in past yr: 0     Depression Screen PHQ 2/9 Scores 07/25/2018 04/21/2018  PHQ - 2 Score 0 0  PHQ- 9 Score 0 -   Cognitive Testing - 6-CIT  Correct? Score   What year is it? yes 0 0 or 4  What month is it? yes 0 0 or 3  Memorize:    Pia Mau,  [birth year],  [place],      What time is it? (within 1 hour) yes 0 0 or 3  Count backwards from 20 yes 0 0, 2, or 4  Name the months of the year yes 0 0, 2, or 4  Repeat name & address above  yes 0 0, 2, 4, 6, 8, or 10       TOTAL SCORE  0/28   Interpretation:  Normal  Normal (0-7) Abnormal (8-28)     No flowsheet data found.    Assessment & Plan:     Annual Wellness Visit  Reviewed patient's Family Medical History Reviewed and updated list of patient's medical providers Assessment of cognitive impairment was done Assessed patient's functional ability Established a written schedule for health screening [place] Completed and Reviewed  Exercise Activities and Dietary recommendations Goals   None      There is no immunization history on file for this patient.  Health Maintenance  Topic Date Due  . TETANUS/TDAP  08/10/1960  . DEXA SCAN  08/11/2006  . PNA vac Low Risk Adult (1 of 2 - PCV13) 08/11/2006  . INFLUENZA VACCINE  08/16/2018     Discussed health benefits of physical activity, and encouraged her to engage in regular exercise appropriate for her age and condition.   1.Medicare, Subsequent Visit  Normal exam. Most likely up to date on vaccinations and screenings. Medical record release form completed again to see if we can obtain records from her previous PCP to update records.  - CBC w/Diff/Platelet - Comprehensive Metabolic Panel (CMET) - Lipid Profile  2. Breast cancer screening Breast exam today was normal. There is no family history of breast cancer. She does perform regular self breast exams. Mammogram  was ordered as below. Information for [facility] clinic was given to patient so she may schedule her mammogram at her convenience.  3. Essential hypertension Home readings are normally less than 130/70 at home. Continue lisinopril-hctz 10-12.5mg . Will check labs as below and f/u pending results. - CBC w/Diff/Platelet - Comprehensive Metabolic Panel (CMET) - Lipid Profile  4. CKD (chronic kidney disease) stage 3, GFR 30-59 ml/min (HCC) Stable. Will check labs as below and f/u pending results. - CBC w/Diff/Platelet  - Comprehensive Metabolic Panel (CMET) - Lipid Profile  5. Hypophosphatemia H/O this. Will check labs as below and f/u pending results. - CBC w/Diff/Platelet - Comprehensive Metabolic Panel (CMET) - Lipid Profile - Phosphorus  6. Elevated LFTs H/o this. Will check labs as below and f/u pending results. - CBC w/Diff/Platelet - Comprehensive Metabolic Panel (CMET) - Lipid Profile --------------------------------------------------------------------------  I,April Miller,acting as a scribe for [employer], [name].,have documented all relevant documentation on the behalf of Mar Daring, [name],as directed by  Mar Daring, [name] while in the presence of Mar Daring, [name].   Mar Daring, [name]  [facility]

## 2018-07-25 ENCOUNTER — Other Ambulatory Visit: Payer: Self-pay

## 2018-07-25 ENCOUNTER — Encounter: Payer: Self-pay | Admitting: Physician Assistant

## 2018-07-25 ENCOUNTER — Ambulatory Visit (INDEPENDENT_AMBULATORY_CARE_PROVIDER_SITE_OTHER): Payer: Medicare Other | Admitting: Physician Assistant

## 2018-07-25 VITALS — BP 181/74 | HR 65 | Temp 97.7°F | Resp 16 | Ht 62.0 in | Wt 128.0 lb

## 2018-07-25 DIAGNOSIS — Z1239 Encounter for other screening for malignant neoplasm of breast: Secondary | ICD-10-CM

## 2018-07-25 DIAGNOSIS — N183 Chronic kidney disease, stage 3 unspecified: Secondary | ICD-10-CM

## 2018-07-25 DIAGNOSIS — R319 Hematuria, unspecified: Secondary | ICD-10-CM

## 2018-07-25 DIAGNOSIS — I10 Essential (primary) hypertension: Secondary | ICD-10-CM

## 2018-07-25 DIAGNOSIS — Z87448 Personal history of other diseases of urinary system: Secondary | ICD-10-CM

## 2018-07-25 DIAGNOSIS — N390 Urinary tract infection, site not specified: Secondary | ICD-10-CM

## 2018-07-25 DIAGNOSIS — R7989 Other specified abnormal findings of blood chemistry: Secondary | ICD-10-CM

## 2018-07-25 DIAGNOSIS — Z0000 Encounter for general adult medical examination without abnormal findings: Secondary | ICD-10-CM

## 2018-07-25 DIAGNOSIS — R945 Abnormal results of liver function studies: Secondary | ICD-10-CM

## 2018-07-25 LAB — POCT URINALYSIS DIPSTICK
Appearance: NORMAL
Bilirubin, UA: NEGATIVE
Glucose, UA: NEGATIVE
Ketones, UA: NEGATIVE
Nitrite, UA: POSITIVE
Odor: ABNORMAL
Protein, UA: NEGATIVE
Spec Grav, UA: 1.02 (ref 1.010–1.025)
Urobilinogen, UA: 0.2 E.U./dL
pH, UA: 6 (ref 5.0–8.0)

## 2018-07-25 NOTE — Patient Instructions (Signed)
Health Maintenance After Age 77 After age 77, you are at a higher risk for certain long-term diseases and infections as well as injuries from falls. Falls are a major cause of broken bones and head injuries in people who are older than age 77. Getting regular preventive care can help to keep you healthy and well. Preventive care includes getting regular testing and making lifestyle changes as recommended by your health care provider. Talk with your health care provider about:  Which screenings and tests you should have. A screening is a test that checks for a disease when you have no symptoms.  A diet and exercise plan that is right for you. What should I know about screenings and tests to prevent falls? Screening and testing are the best ways to find a health problem early. Early diagnosis and treatment give you the best chance of managing medical conditions that are common after age 77. Certain conditions and lifestyle choices may make you more likely to have a fall. Your health care provider may recommend:  Regular vision checks. Poor vision and conditions such as cataracts can make you more likely to have a fall. If you wear glasses, make sure to get your prescription updated if your vision changes.  Medicine review. Work with your health care provider to regularly review all of the medicines you are taking, including over-the-counter medicines. Ask your health care provider about any side effects that may make you more likely to have a fall. Tell your health care provider if any medicines that you take make you feel dizzy or sleepy.  Osteoporosis screening. Osteoporosis is a condition that causes the bones to get weaker. This can make the bones weak and cause them to break more easily.  Blood pressure screening. Blood pressure changes and medicines to control blood pressure can make you feel dizzy.  Strength and balance checks. Your health care provider may recommend certain tests to check your  strength and balance while standing, walking, or changing positions.  Foot health exam. Foot pain and numbness, as well as not wearing proper footwear, can make you more likely to have a fall.  Depression screening. You may be more likely to have a fall if you have a fear of falling, feel emotionally low, or feel unable to do activities that you used to do.  Alcohol use screening. Using too much alcohol can affect your balance and may make you more likely to have a fall. What actions can I take to lower my risk of falls? General instructions  Talk with your health care provider about your risks for falling. Tell your health care provider if: ? You fall. Be sure to tell your health care provider about all falls, even ones that seem minor. ? You feel dizzy, sleepy, or off-balance.  Take over-the-counter and prescription medicines only as told by your health care provider. These include any supplements.  Eat a healthy diet and maintain a healthy weight. A healthy diet includes low-fat dairy products, low-fat (lean) meats, and fiber from whole grains, beans, and lots of fruits and vegetables. Home safety  Remove any tripping hazards, such as rugs, cords, and clutter.  Install safety equipment such as grab bars in bathrooms and safety rails on stairs.  Keep rooms and walkways well-lit. Activity   Follow a regular exercise program to stay fit. This will help you maintain your balance. Ask your health care provider what types of exercise are appropriate for you.  If you need a cane or   walker, use it as recommended by your health care provider.  Wear supportive shoes that have nonskid soles. Lifestyle  Do not drink alcohol if your health care provider tells you not to drink.  If you drink alcohol, limit how much you have: ? 0-1 drink a day for women. ? 0-2 drinks a day for men.  Be aware of how much alcohol is in your drink. In the [place] one drink equals one typical bottle of beer (12  oz), one-half glass of wine (5 oz), or one shot of hard liquor (1 oz).  Do not use any products that contain nicotine or tobacco, such as cigarettes and e-cigarettes. If you need help quitting, ask your health care provider. Summary  Having a healthy lifestyle and getting preventive care can help to protect your health and wellness after age 77.  Screening and testing are the best way to find a health problem early and help you avoid having a fall. Early diagnosis and treatment give you the best chance for managing medical conditions that are more common for people who are older than age 77.  Falls are a major cause of broken bones and head injuries in people who are older than age 77. Take precautions to prevent a fall at home.  Work with your health care provider to learn what changes you can make to improve your health and wellness and to prevent falls. This information is not intended to replace advice given to you by your health care provider. Make sure you discuss any questions you have with your health care provider. Document Released: 11/14/2016 Document Revised: 04/24/2018 Document Reviewed: 11/14/2016 Elsevier Patient Education  2020 [employer]  

## 2018-07-26 LAB — COMPREHENSIVE METABOLIC PANEL
ALT: 16 IU/L (ref 0–32)
AST: 28 IU/L (ref 0–40)
Albumin/Globulin Ratio: 1.8 (ref 1.2–2.2)
Albumin: 4.3 g/dL (ref 3.7–4.7)
Alkaline Phosphatase: 79 IU/L (ref 39–117)
BUN/Creatinine Ratio: 19 (ref 12–28)
BUN: 21 mg/dL (ref 8–27)
Bilirubin Total: 0.4 mg/dL (ref 0.0–1.2)
CO2: 24 mmol/L (ref 20–29)
Calcium: 9.7 mg/dL (ref 8.7–10.3)
Chloride: 104 mmol/L (ref 96–106)
Creatinine, Ser: 1.13 mg/dL — ABNORMAL HIGH (ref 0.57–1.00)
GFR calc Af Amer: 55 mL/min/{1.73_m2} — ABNORMAL LOW (ref >59)
GFR calc non Af Amer: 47 mL/min/{1.73_m2} — ABNORMAL LOW (ref >59)
Globulin, Total: 2.4 g/dL (ref 1.5–4.5)
Glucose: 75 mg/dL (ref 65–99)
Potassium: 4.5 mmol/L (ref 3.5–5.2)
Sodium: 142 mmol/L (ref 134–144)
Total Protein: 6.7 g/dL (ref 6.0–8.5)

## 2018-07-26 LAB — LIPID PANEL
Chol/HDL Ratio: 2.8 ratio (ref 0.0–4.4)
Cholesterol, Total: 200 mg/dL — ABNORMAL HIGH (ref 100–199)
HDL: 71 mg/dL (ref >39)
LDL Calculated: 112 mg/dL — ABNORMAL HIGH (ref 0–99)
Triglycerides: 85 mg/dL (ref 0–149)
VLDL Cholesterol Cal: 17 mg/dL (ref 5–40)

## 2018-07-26 LAB — CBC WITH DIFFERENTIAL/PLATELET
Basophils Absolute: 0 10*3/uL (ref 0.0–0.2)
Basos: 1 %
EOS (ABSOLUTE): 0.2 10*3/uL (ref 0.0–0.4)
Eos: 4 %
Hematocrit: 42.9 % (ref 34.0–46.6)
Hemoglobin: 14.4 g/dL (ref 11.1–15.9)
Immature Grans (Abs): 0 10*3/uL (ref 0.0–0.1)
Immature Granulocytes: 0 %
Lymphocytes Absolute: 1.9 10*3/uL (ref 0.7–3.1)
Lymphs: 39 %
MCH: 30.4 pg (ref 26.6–33.0)
MCHC: 33.6 g/dL (ref 31.5–35.7)
MCV: 91 fL (ref 79–97)
Monocytes Absolute: 0.4 10*3/uL (ref 0.1–0.9)
Monocytes: 8 %
Neutrophils Absolute: 2.3 10*3/uL (ref 1.4–7.0)
Neutrophils: 48 %
Platelets: 199 10*3/uL (ref 150–450)
RBC: 4.74 x10E6/uL (ref 3.77–5.28)
RDW: 12 % (ref 11.7–15.4)
WBC: 4.8 10*3/uL (ref 3.4–10.8)

## 2018-07-26 LAB — PHOSPHORUS: Phosphorus: 3.4 mg/dL (ref 3.0–4.3)

## 2018-07-27 LAB — CULTURE, URINE COMPREHENSIVE

## 2018-07-28 ENCOUNTER — Telehealth: Payer: Self-pay

## 2018-07-28 MED ORDER — CEPHALEXIN 500 MG PO CAPS: 500.0000 mg | ORAL_CAPSULE | Freq: Two times a day (BID) | ORAL | 0 refills | Status: DC

## 2018-07-28 NOTE — Addendum Note (Signed)
Addended by: Mar Daring on: 07/28/2018 11:38 AM   Modules accepted: Orders

## 2018-07-28 NOTE — Telephone Encounter (Signed)
Viewed by Buel Ream on 07/28/2018 11:41 AM Written by Mar Daring, [name] on 07/28/2018 11:37 AM Labs were normal and stable. However, urine culture did grow out a strep bacteria. I will send in antibiotic to treat

## 2018-07-28 NOTE — Telephone Encounter (Signed)
-----   Message from Mar Daring, [name] sent at 07/28/2018 11:37 AM EDT ----- Labs were normal and stable. However, urine culture did grow out a strep bacteria. I will send in antibiotic to treat.

## 2018-08-01 ENCOUNTER — Encounter: Payer: Self-pay | Admitting: Physician Assistant

## 2018-08-06 ENCOUNTER — Other Ambulatory Visit: Payer: Self-pay

## 2018-08-06 ENCOUNTER — Telehealth: Payer: Self-pay | Admitting: Physician Assistant

## 2018-08-06 DIAGNOSIS — Z87440 Personal history of urinary (tract) infections: Secondary | ICD-10-CM

## 2018-08-06 NOTE — Telephone Encounter (Signed)
Done

## 2018-08-06 NOTE — Telephone Encounter (Signed)
pt called saying she seen Tawanna Sat for a UTI and finished her antibiotic Monday.  She would like to drop a specimen off today or tomorrow.  She is going out of town Friday and wants to make sure she is clear.  She is not having any symptoms.  CB#  [id number]  teri

## 2018-08-06 NOTE — Telephone Encounter (Signed)
Yes this is perfectly fine. I will order poct ua off this phone message.

## 2018-08-06 NOTE — Telephone Encounter (Signed)
Abigail Acosta are you ok with this

## 2018-08-08 ENCOUNTER — Other Ambulatory Visit: Payer: Self-pay

## 2018-08-08 DIAGNOSIS — N390 Urinary tract infection, site not specified: Secondary | ICD-10-CM

## 2018-08-08 DIAGNOSIS — Z87440 Personal history of urinary (tract) infections: Secondary | ICD-10-CM

## 2018-08-08 LAB — POCT URINALYSIS DIPSTICK
Bilirubin, UA: NEGATIVE
Blood, UA: NEGATIVE
Glucose, UA: NEGATIVE
Ketones, UA: NEGATIVE
Leukocytes, UA: NEGATIVE
Nitrite, UA: NEGATIVE
Protein, UA: NEGATIVE
Spec Grav, UA: <=1.005 — AB (ref 1.010–1.025)
Urobilinogen, UA: 0.2 E.U./dL
pH, UA: 7 (ref 5.0–8.0)

## 2018-08-10 LAB — URINE CULTURE

## 2018-08-10 LAB — SPECIMEN STATUS REPORT

## 2018-10-09 ENCOUNTER — Ambulatory Visit
Admission: RE | Admit: 2018-10-09 | Discharge: 2018-10-09 | Disposition: A | Discharge status: Home / Self Care | Payer: Medicare Other | Source: Ambulatory Visit | Attending: Physician Assistant | Admitting: Physician Assistant

## 2018-10-09 DIAGNOSIS — Z1239 Encounter for other screening for malignant neoplasm of breast: Secondary | ICD-10-CM

## 2018-10-09 DIAGNOSIS — Z1231 Encounter for screening mammogram for malignant neoplasm of breast: Secondary | ICD-10-CM | POA: Diagnosis present

## 2018-10-23 ENCOUNTER — Other Ambulatory Visit: Payer: Self-pay | Admitting: Physician Assistant

## 2018-10-23 DIAGNOSIS — R921 Mammographic calcification found on diagnostic imaging of breast: Secondary | ICD-10-CM

## 2018-10-23 DIAGNOSIS — R928 Other abnormal and inconclusive findings on diagnostic imaging of breast: Secondary | ICD-10-CM

## 2018-11-19 ENCOUNTER — Other Ambulatory Visit: Payer: Self-pay | Admitting: Physician Assistant

## 2018-11-19 ENCOUNTER — Ambulatory Visit
Admission: RE | Admit: 2018-11-19 | Discharge: 2018-11-19 | Disposition: A | Discharge status: Home / Self Care | Payer: Medicare Other | Source: Ambulatory Visit | Attending: Physician Assistant | Admitting: Physician Assistant

## 2018-11-19 DIAGNOSIS — N6320 Unspecified lump in the left breast, unspecified quadrant: Secondary | ICD-10-CM

## 2018-11-19 DIAGNOSIS — R928 Other abnormal and inconclusive findings on diagnostic imaging of breast: Secondary | ICD-10-CM

## 2018-11-19 DIAGNOSIS — R921 Mammographic calcification found on diagnostic imaging of breast: Secondary | ICD-10-CM | POA: Insufficient documentation

## 2018-11-28 ENCOUNTER — Ambulatory Visit
Admission: RE | Admit: 2018-11-28 | Discharge: 2018-11-28 | Disposition: A | Discharge status: Home / Self Care | Payer: Medicare Other | Source: Ambulatory Visit | Attending: Physician Assistant | Admitting: Physician Assistant

## 2018-11-28 DIAGNOSIS — R921 Mammographic calcification found on diagnostic imaging of breast: Secondary | ICD-10-CM | POA: Insufficient documentation

## 2018-11-28 DIAGNOSIS — N6320 Unspecified lump in the left breast, unspecified quadrant: Secondary | ICD-10-CM

## 2018-11-28 DIAGNOSIS — R928 Other abnormal and inconclusive findings on diagnostic imaging of breast: Secondary | ICD-10-CM | POA: Diagnosis present

## 2018-12-01 LAB — SURGICAL PATHOLOGY

## 2018-12-09 ENCOUNTER — Other Ambulatory Visit: Payer: Self-pay

## 2018-12-09 ENCOUNTER — Ambulatory Visit: Payer: Self-pay | Admitting: Surgery

## 2018-12-09 ENCOUNTER — Ambulatory Visit (INDEPENDENT_AMBULATORY_CARE_PROVIDER_SITE_OTHER): Payer: Medicare Other | Admitting: Surgery

## 2018-12-09 ENCOUNTER — Encounter: Payer: Self-pay | Admitting: Surgery

## 2018-12-09 VITALS — BP 162/84 | HR 90 | Temp 97.2°F | Resp 16 | Ht 61.5 in | Wt 129.6 lb

## 2018-12-09 DIAGNOSIS — R928 Other abnormal and inconclusive findings on diagnostic imaging of breast: Secondary | ICD-10-CM | POA: Diagnosis not present

## 2018-12-09 NOTE — H&P (View-Only) (Signed)
Patient ID: Abigail Acosta, female   DOB: 12/09/1940, 78 y.o.   MRN: [id number]  Chief Complaint:   History of Present Illness Abigail Acosta is a 78 y.o. female with with a pathology report discordant per radiology for a lesion identified on mammography and ultrasound of the left breast at 7:00.  Patient underwent core biopsy about a week ago.  She took Premarin for nearly 62 years following her hysterectomy at age 74.  She had menses at age 6; had 3 pregnancies prior to her hysterectomy.  She has no known palpable lump, discharge or skin changes.  She denies any mastodynia.  She reports reports doing monthly breast examinations.  Had dermatology burned some lesions from her anterior chest skin, for which she is still healing from. She denies a family history of breast or ovarian cancer.  Past Medical History Past Medical History:  Diagnosis Date  . Allergy   . Cataract   . Cataract    right eye-not mature  . Varicose veins       Past Surgical History:  Procedure Laterality Date  . ABDOMINAL HYSTERECTOMY    . ANTERIOR AND POSTERIOR REPAIR N/A 04/28/2013   Procedure: REPAIR CYSTOCELE AND RECTOCELE;  Surgeon: Reece Packer, MD;  Location: [facility];  Service: Urology;  Laterality: N/A;  . APPENDECTOMY    . CATARACT EXTRACTION Left   . CYSTOSCOPY N/A 04/28/2013   Procedure: CYSTOSCOPY;  Surgeon: Reece Packer, MD;  Location: [facility];  Service: Urology;  Laterality: N/A;  . HERNIA REPAIR Bilateral    inguinal  . VAGINAL PROLAPSE REPAIR N/A 04/28/2013   Procedure: VAULT PROLAPSE AND GRAFT  ;  Surgeon: Reece Packer, MD;  Location: [facility];  Service: Urology;  Laterality: N/A;  . VARICOSE VEIN SURGERY Right    stripping    No Known Allergies  Current Outpatient Medications  Medication Sig Dispense Refill  . albuterol (PROVENTIL HFA;VENTOLIN HFA) 108 (90 Base) MCG/ACT inhaler Inhale 2 puffs into the lungs every 6 (six) hours as needed for wheezing or shortness of breath.  (Patient not taking: Reported on 07/25/2018) 1 Inhaler 0  . BIOTIN PO Take 1 tablet by mouth daily.    . cephALEXin (KEFLEX) 500 MG capsule Take 1 capsule (500 mg total) by mouth 2 (two) times daily. 14 capsule 0  . cetirizine (ZYRTEC) 10 MG tablet Take 10 mg by mouth daily as needed for allergies.     . fluticasone (FLONASE) 50 MCG/ACT nasal spray Place 2 sprays into both nostrils daily. (Patient not taking: Reported on 07/25/2018) 16 g 2  . lisinopril-hydrochlorothiazide (ZESTORETIC) 10-12.5 MG tablet Take 1 tablet by mouth daily. 90 tablet 1  . montelukast (SINGULAIR) 10 MG tablet Take 1 tablet (10 mg total) by mouth at bedtime. (Patient not taking: Reported on 07/25/2018) 30 tablet 3  . sodium chloride (OCEAN) 0.65 % SOLN nasal spray Place 1 spray into both nostrils as needed for congestion.     No current facility-administered medications for this visit.     Family History Family History  Adopted: Yes  Problem Relation Age of Onset  . Cancer Mother   . Breast cancer Neg Hx       Social History Social History   Tobacco Use  . Smoking status: Never Smoker  . Smokeless tobacco: Never Used  Substance Use Topics  . Alcohol use: No  . Drug use: No        Review of Systems  Constitutional: Negative.   HENT: Positive  for hearing loss.   Respiratory: Negative.   Cardiovascular: Negative.   Gastrointestinal: Negative.   Genitourinary: Negative.   Skin: Negative.   Neurological: Negative.   Endo/Heme/Allergies: Negative.       Physical Exam There were no vitals taken for this visit.   CONSTITUTIONAL: Well developed, and nourished, appropriately responsive and aware without distress.   EYES: Sclera non-icteric.   EARS, NOSE, MOUTH AND THROAT: Mask worn.  The oropharynx is clear.  Hearing is intact to voice.  NECK: Trachea is midline, and there is no jugular venous distension.  LYMPH NODES:  Lymph nodes in the neck are not enlarged. RESPIRATORY:  Lungs are clear, and  breath sounds are equal bilaterally. Normal respiratory effort without pathologic use of accessory muscles. CARDIOVASCULAR: Heart is regular in rate and rhythm. GI: The abdomen is  soft, nontender, and nondistended. Midline scar.  There were no palpable masses. I did not appreciate hepatosplenomegaly.  GU: Bilateral breast exam unremarkable for suspicious nodularity, densities or masses.  Multiple areas of overlying skin treated recently by dermatology, and somewhat inflamed but no evidence of infection.  Medial inferior quadrant of the left breast has a small amount of yellow discoloration from resolving ecchymosis.  No appreciable density in the region. MUSCULOSKELETAL:  Symmetrical muscle tone appreciated in all four extremities.    SKIN: Skin turgor is normal. No pathologic skin lesions appreciated.  NEUROLOGIC:  Motor and sensation appear grossly normal.  Cranial nerves are grossly without defect. PSYCH:  Alert and oriented to person, place and time. Affect is appropriate for situation.  Data Reviewed I have personally reviewed what is currently available of the patient's imaging, recent labs and medical records.   In my opinion these appear to be benign clustered calcifications, with an associated mass, probably consistent with the pathology however this is not the radiologist opinion. ADDENDUM REPORT: 12/03/2018 16:01  ADDENDUM: PATHOLOGY revealed: A. BREAST, LEFT AT 7:00, 4 CM FROM THE NIPPLE; ULTRASOUND-GUIDED CORE BIOPSY: - BENIGN MAMMARY PARENCHYMA WITH DENSE STROMAL FIBROSIS. - FOCAL MICROCALCIFICATIONS ASSOCIATED WITH BENIGN MAMMARY ELEMENTS. - NEGATIVE FOR ATYPICAL PROLIFERATIVE BREAST DISEASE. Comment: Multiple additional deeper recut levels were examined to evaluate for calcifications.  Pathology results are DISCORDANT per Dr. Dorise Bullion with surgical excision recommended.  I telephoned the patient on 12/03/2018 and discussed these results and the recommendations  stated below. All questions were answered. The patient denies significant pain or bleeding from the biopsy site. Biopsy site care instructions were reviewed and the patient was asked to call [facility] with any questions or issues related to the biopsy.  Recommendation: Surgical referral. Request for surgical referral was relayed to [place] [facility] by Electa Sniff RN on 12/03/2018.  Addendum by Electa Sniff RN on 12/03/2018.   Electronically Signed   By: Dorise Bullion III M.D   On: 12/03/2018 16:01   Addended by York Ram, MD on 12/04/2018 9:34 AM    Study Result  CLINICAL DATA:  Biopsy of a left breast mass  EXAM: ULTRASOUND GUIDED LEFT BREAST CORE NEEDLE BIOPSY  COMPARISON:  Previous exam(s).  FINDINGS: I met with the patient and we discussed the procedure of ultrasound-guided biopsy, including benefits and alternatives. We discussed the high likelihood of a successful procedure. We discussed the risks of the procedure, including infection, bleeding, tissue injury, clip migration, and inadequate sampling. Informed written consent was given. The usual time-out protocol was performed immediately prior to the procedure.  Lesion quadrant: 7 o'clock  Using sterile technique  and 1% Lidocaine as local anesthetic, under direct ultrasound visualization, a 12 gauge spring-loaded device was used to perform biopsy of a 7 o'clock left breast mass using a medial approach. At the conclusion of the procedure tissue marker clip was deployed into the biopsy cavity. Follow up 2 view mammogram was performed and dictated separately.  IMPRESSION: Ultrasound guided biopsy of a 7 o'clock left breast mass. No apparent complications.  Electronically Signed: By: Dorise Bullion III M.D On: 11/28/2018 11:13     Result History  [place] LT BREAST BX W LOC DEV 1ST LESION IMG [place] SPEC US GUIDE (Order [id number]) on 12/04/2018 - Order Result History Report  - Result Edited   MM CLIP PLACEMENT LEFT (Order [id number]) - Reflex for Order [id number] Study Result  CLINICAL DATA:  Evaluate biopsy marker  EXAM: DIAGNOSTIC LEFT MAMMOGRAM POST ULTRASOUND BIOPSY  COMPARISON:  Previous exam(s).  FINDINGS: Mammographic images were obtained following ultrasound guided biopsy of a left breast mass containing calcifications. The biopsy marking clip is immediately posterior to the biopsied mass.  IMPRESSION: Appropriate positioning of the ribbon shaped shaped biopsy marking clip just posterior to the biopsied mass.  Final Assessment: Post Procedure Mammograms for Marker Placement   Electronically Signed   By: Dorise Bullion III M.D   On: 11/28/2018 11:17   Result History  MM CLIP PLACEMENT LEFT (Order #[id number]) on 11/28/2018 - Order Result History Report    MM Digital Diagnostic Unilat L (Order [id number]) - Reflex for Order [id number] Study Result  CLINICAL DATA:  Patient was called back from screening mammogram for left breast calcifications.  EXAM: DIGITAL DIAGNOSTIC LEFT MAMMOGRAM WITH CAD  ULTRASOUND LEFT BREAST  COMPARISON:  Screening mammogram dated 10/09/2018.  ACR Breast Density Category c: The breast tissue is heterogeneously dense, which may obscure small masses.  FINDINGS: Additional imaging of the left breast was performed there is coarse heterogeneous calcifications in the lower-inner quadrant of the left breast. There is an associated 1.7 cm mass.  Mammographic images were processed with CAD.  On physical exam, I do not palpate a mass in the lower-inner quadrant of the left breast.  Targeted ultrasound is performed, showing an irregular hypoechoic mass in the left breast at 7 o'clock 4 cm from the nipple measuring 1.5 x 1.1 x 0.9 cm. There are echogenic foci at the peripheral margin the mass consistent with calcifications. Sonographic evaluation of the left axilla does not show any enlarged  adenopathy.  IMPRESSION: Suspicious mass and calcifications in the 7 o'clock region of the left breast.  RECOMMENDATION: Ultrasound-guided core biopsy of the mass and calcifications in the 7 o'clock region of the left breast is recommended.  I have discussed the findings and recommendations with the patient. If applicable, a reminder letter will be sent to the patient regarding the next appointment.  BI-RADS CATEGORY  4: Suspicious.   Electronically Signed   By: Lillia Mountain M.D.   On: 11/19/2018 11:27    Assessment   Suspicious lesion, left breast at 7:00 status post core biopsy which is discordant with imaging per radiology.  Patient Active Problem List   Diagnosis Date Noted  . S/P hernia surgery 04/21/2018  . Hypophosphatemia 04/04/2018  . H/O pyelonephritis 04/03/2018  . Elevated LFTs 04/03/2018  . CKD (chronic kidney disease) stage 3, GFR 30-59 ml/min 04/03/2018  . Essential hypertension 04/03/2018  . Cystocele 04/28/2013    Plan    Needle localized left breast lumpectomy. Alternatives discussed in detail.  For peace of mind she  would like to pursue the additional excisional biopsy.  Risks and benefits discussed with the patient which include but are not limited to: Anesthesia, bleeding, infection, scarring, need for additional imaging and follow-up, potential of missing lesion of concern, etc.  Questions answered.  No guarantees ever expressed or implied.   Face-to-face time spent with the patient and accompanying care providers(if present) was 45 minutes, with more than 50% of the time spent counseling, educating, and coordinating care of the patient.      Ronny Bacon 12/09/2018, 10:35 AM

## 2018-12-09 NOTE — Progress Notes (Signed)
Patient ID: Abigail Acosta, female   DOB: 01/21/1940, 78 y.o.   MRN: [id number]  Chief Complaint:   History of Present Illness Abigail Acosta is a 78 y.o. female with with a pathology report discordant per radiology for a lesion identified on mammography and ultrasound of the left breast at 7:00.  Patient underwent core biopsy about a week ago.  She took Premarin for nearly 46 years following her hysterectomy at age 26.  She had menses at age 17; had 3 pregnancies prior to her hysterectomy.  She has no known palpable lump, discharge or skin changes.  She denies any mastodynia.  She reports reports doing monthly breast examinations.  Had dermatology burned some lesions from her anterior chest skin, for which she is still healing from. She denies a family history of breast or ovarian cancer.  Past Medical History Past Medical History:  Diagnosis Date  . Allergy   . Cataract   . Cataract    right eye-not mature  . Varicose veins       Past Surgical History:  Procedure Laterality Date  . ABDOMINAL HYSTERECTOMY    . ANTERIOR AND POSTERIOR REPAIR N/A 04/28/2013   Procedure: REPAIR CYSTOCELE AND RECTOCELE;  Surgeon: Scott A MacDiarmid, MD;  Location: [facility];  Service: Urology;  Laterality: N/A;  . APPENDECTOMY    . CATARACT EXTRACTION Left   . CYSTOSCOPY N/A 04/28/2013   Procedure: CYSTOSCOPY;  Surgeon: Scott A MacDiarmid, MD;  Location: [facility];  Service: Urology;  Laterality: N/A;  . HERNIA REPAIR Bilateral    inguinal  . VAGINAL PROLAPSE REPAIR N/A 04/28/2013   Procedure: VAULT PROLAPSE AND GRAFT  ;  Surgeon: Scott A MacDiarmid, MD;  Location: [facility];  Service: Urology;  Laterality: N/A;  . VARICOSE VEIN SURGERY Right    stripping    No Known Allergies  Current Outpatient Medications  Medication Sig Dispense Refill  . albuterol (PROVENTIL HFA;VENTOLIN HFA) 108 (90 Base) MCG/ACT inhaler Inhale 2 puffs into the lungs every 6 (six) hours as needed for wheezing or shortness of breath.  (Patient not taking: Reported on 07/25/2018) 1 Inhaler 0  . BIOTIN PO Take 1 tablet by mouth daily.    . cephALEXin (KEFLEX) 500 MG capsule Take 1 capsule (500 mg total) by mouth 2 (two) times daily. 14 capsule 0  . cetirizine (ZYRTEC) 10 MG tablet Take 10 mg by mouth daily as needed for allergies.     . fluticasone (FLONASE) 50 MCG/ACT nasal spray Place 2 sprays into both nostrils daily. (Patient not taking: Reported on 07/25/2018) 16 g 2  . lisinopril-hydrochlorothiazide (ZESTORETIC) 10-12.5 MG tablet Take 1 tablet by mouth daily. 90 tablet 1  . montelukast (SINGULAIR) 10 MG tablet Take 1 tablet (10 mg total) by mouth at bedtime. (Patient not taking: Reported on 07/25/2018) 30 tablet 3  . sodium chloride (OCEAN) 0.65 % SOLN nasal spray Place 1 spray into both nostrils as needed for congestion.     No current facility-administered medications for this visit.     Family History Family History  Adopted: Yes  Problem Relation Age of Onset  . Cancer Mother   . Breast cancer Neg Hx       Social History Social History   Tobacco Use  . Smoking status: Never Smoker  . Smokeless tobacco: Never Used  Substance Use Topics  . Alcohol use: No  . Drug use: No        Review of Systems  Constitutional: Negative.   HENT: Positive   for hearing loss.   Respiratory: Negative.   Cardiovascular: Negative.   Gastrointestinal: Negative.   Genitourinary: Negative.   Skin: Negative.   Neurological: Negative.   Endo/Heme/Allergies: Negative.       Physical Exam There were no vitals taken for this visit.   CONSTITUTIONAL: Well developed, and nourished, appropriately responsive and aware without distress.   EYES: Sclera non-icteric.   EARS, NOSE, MOUTH AND THROAT: Mask worn.  The oropharynx is clear.  Hearing is intact to voice.  NECK: Trachea is midline, and there is no jugular venous distension.  LYMPH NODES:  Lymph nodes in the neck are not enlarged. RESPIRATORY:  Lungs are clear, and  breath sounds are equal bilaterally. Normal respiratory effort without pathologic use of accessory muscles. CARDIOVASCULAR: Heart is regular in rate and rhythm. GI: The abdomen is  soft, nontender, and nondistended. Midline scar.  There were no palpable masses. I did not appreciate hepatosplenomegaly.  GU: Bilateral breast exam unremarkable for suspicious nodularity, densities or masses.  Multiple areas of overlying skin treated recently by dermatology, and somewhat inflamed but no evidence of infection.  Medial inferior quadrant of the left breast has a small amount of yellow discoloration from resolving ecchymosis.  No appreciable density in the region. MUSCULOSKELETAL:  Symmetrical muscle tone appreciated in all four extremities.    SKIN: Skin turgor is normal. No pathologic skin lesions appreciated.  NEUROLOGIC:  Motor and sensation appear grossly normal.  Cranial nerves are grossly without defect. PSYCH:  Alert and oriented to person, place and time. Affect is appropriate for situation.  Data Reviewed I have personally reviewed what is currently available of the patient's imaging, recent labs and medical records.   In my opinion these appear to be benign clustered calcifications, with an associated mass, probably consistent with the pathology however this is not the radiologist opinion. ADDENDUM REPORT: 12/03/2018 16:01  ADDENDUM: PATHOLOGY revealed: A. BREAST, LEFT AT 7:00, 4 CM FROM THE NIPPLE; ULTRASOUND-GUIDED CORE BIOPSY: - BENIGN MAMMARY PARENCHYMA WITH DENSE STROMAL FIBROSIS. - FOCAL MICROCALCIFICATIONS ASSOCIATED WITH BENIGN MAMMARY ELEMENTS. - NEGATIVE FOR ATYPICAL PROLIFERATIVE BREAST DISEASE. Comment: Multiple additional deeper recut levels were examined to evaluate for calcifications.  Pathology results are DISCORDANT per Dr. Dorise Bullion with surgical excision recommended.  I telephoned the patient on 12/03/2018 and discussed these results and the recommendations  stated below. All questions were answered. The patient denies significant pain or bleeding from the biopsy site. Biopsy site care instructions were reviewed and the patient was asked to call [facility] with any questions or issues related to the biopsy.  Recommendation: Surgical referral. Request for surgical referral was relayed to [place] [facility] by Electa Sniff RN on 12/03/2018.  Addendum by Electa Sniff RN on 12/03/2018.   Electronically Signed   By: Dorise Bullion III M.D   On: 12/03/2018 16:01   Addended by York Ram, MD on 12/04/2018 9:34 AM    Study Result  CLINICAL DATA:  Biopsy of a left breast mass  EXAM: ULTRASOUND GUIDED LEFT BREAST CORE NEEDLE BIOPSY  COMPARISON:  Previous exam(s).  FINDINGS: I met with the patient and we discussed the procedure of ultrasound-guided biopsy, including benefits and alternatives. We discussed the high likelihood of a successful procedure. We discussed the risks of the procedure, including infection, bleeding, tissue injury, clip migration, and inadequate sampling. Informed written consent was given. The usual time-out protocol was performed immediately prior to the procedure.  Lesion quadrant: 7 o'clock  Using sterile technique  and 1% Lidocaine as local anesthetic, under direct ultrasound visualization, a 12 gauge spring-loaded device was used to perform biopsy of a 7 o'clock left breast mass using a medial approach. At the conclusion of the procedure tissue marker clip was deployed into the biopsy cavity. Follow up 2 view mammogram was performed and dictated separately.  IMPRESSION: Ultrasound guided biopsy of a 7 o'clock left breast mass. No apparent complications.  Electronically Signed: By: Dorise Bullion III M.D On: 11/28/2018 11:13     Result History  [place] LT BREAST BX W LOC DEV 1ST LESION IMG [place] SPEC US GUIDE (Order [id number]) on 12/04/2018 - Order Result History Report  - Result Edited   MM CLIP PLACEMENT LEFT (Order [id number]) - Reflex for Order [id number] Study Result  CLINICAL DATA:  Evaluate biopsy marker  EXAM: DIAGNOSTIC LEFT MAMMOGRAM POST ULTRASOUND BIOPSY  COMPARISON:  Previous exam(s).  FINDINGS: Mammographic images were obtained following ultrasound guided biopsy of a left breast mass containing calcifications. The biopsy marking clip is immediately posterior to the biopsied mass.  IMPRESSION: Appropriate positioning of the ribbon shaped shaped biopsy marking clip just posterior to the biopsied mass.  Final Assessment: Post Procedure Mammograms for Marker Placement   Electronically Signed   By: Dorise Bullion III M.D   On: 11/28/2018 11:17   Result History  MM CLIP PLACEMENT LEFT (Order #[id number]) on 11/28/2018 - Order Result History Report    MM Digital Diagnostic Unilat L (Order [id number]) - Reflex for Order [id number] Study Result  CLINICAL DATA:  Patient was called back from screening mammogram for left breast calcifications.  EXAM: DIGITAL DIAGNOSTIC LEFT MAMMOGRAM WITH CAD  ULTRASOUND LEFT BREAST  COMPARISON:  Screening mammogram dated 10/09/2018.  ACR Breast Density Category c: The breast tissue is heterogeneously dense, which may obscure small masses.  FINDINGS: Additional imaging of the left breast was performed there is coarse heterogeneous calcifications in the lower-inner quadrant of the left breast. There is an associated 1.7 cm mass.  Mammographic images were processed with CAD.  On physical exam, I do not palpate a mass in the lower-inner quadrant of the left breast.  Targeted ultrasound is performed, showing an irregular hypoechoic mass in the left breast at 7 o'clock 4 cm from the nipple measuring 1.5 x 1.1 x 0.9 cm. There are echogenic foci at the peripheral margin the mass consistent with calcifications. Sonographic evaluation of the left axilla does not show any enlarged  adenopathy.  IMPRESSION: Suspicious mass and calcifications in the 7 o'clock region of the left breast.  RECOMMENDATION: Ultrasound-guided core biopsy of the mass and calcifications in the 7 o'clock region of the left breast is recommended.  I have discussed the findings and recommendations with the patient. If applicable, a reminder letter will be sent to the patient regarding the next appointment.  BI-RADS CATEGORY  4: Suspicious.   Electronically Signed   By: Lillia Mountain M.D.   On: 11/19/2018 11:27    Assessment   Suspicious lesion, left breast at 7:00 status post core biopsy which is discordant with imaging per radiology.  Patient Active Problem List   Diagnosis Date Noted  . S/P hernia surgery 04/21/2018  . Hypophosphatemia 04/04/2018  . H/O pyelonephritis 04/03/2018  . Elevated LFTs 04/03/2018  . CKD (chronic kidney disease) stage 3, GFR 30-59 ml/min 04/03/2018  . Essential hypertension 04/03/2018  . Cystocele 04/28/2013    Plan    Needle localized left breast lumpectomy. Alternatives discussed in detail.  For peace of mind she  would like to pursue the additional excisional biopsy.  Risks and benefits discussed with the patient which include but are not limited to: Anesthesia, bleeding, infection, scarring, need for additional imaging and follow-up, potential of missing lesion of concern, etc.  Questions answered.  No guarantees ever expressed or implied.   Face-to-face time spent with the patient and accompanying care providers(if present) was 45 minutes, with more than 50% of the time spent counseling, educating, and coordinating care of the patient.      Ronny Bacon 12/09/2018, 10:35 AM

## 2018-12-09 NOTE — Patient Instructions (Addendum)
Our surgery scheduler Levada Dy will contact you within 24-48 hours to get you scheduled for surgery.   Please have the BLUE available when she contacts you to get you scheduled.

## 2018-12-10 ENCOUNTER — Other Ambulatory Visit: Payer: Self-pay | Admitting: Surgery

## 2018-12-10 DIAGNOSIS — R928 Other abnormal and inconclusive findings on diagnostic imaging of breast: Secondary | ICD-10-CM

## 2018-12-15 ENCOUNTER — Telehealth: Payer: Self-pay | Admitting: Surgery

## 2018-12-15 NOTE — Telephone Encounter (Signed)
Pt has been advised of pre admission date/time, Covid Testing date and Surgery date.  Surgery Date: 12/22/18 with Dr Ulysees Barns breast Bx with NL-patient to arrive at [facility] at 8:30am the day of surgery.  Preadmission Testing Date: 12/17/18 between 8-1:00pm-phone interview.  Covid Testing Date: 12/18/18 between 8-10:30am - patient advised to go to the [facility] ([place]  [employer] Video sent via [employer] [facility] and [employer].

## 2018-12-17 ENCOUNTER — Encounter
Admission: RE | Admit: 2018-12-17 | Discharge: 2018-12-17 | Disposition: A | Discharge status: Home / Self Care | Payer: Medicare Other | Source: Ambulatory Visit | Attending: Surgery | Admitting: Surgery

## 2018-12-17 ENCOUNTER — Other Ambulatory Visit: Payer: Self-pay

## 2018-12-17 HISTORY — DX: Other complications of anesthesia, initial encounter: T88.59XA

## 2018-12-17 HISTORY — DX: Other specified postprocedural states: Z98.890

## 2018-12-17 HISTORY — DX: Essential (primary) hypertension: I10

## 2018-12-17 HISTORY — DX: Other specified postprocedural states: R11.2

## 2018-12-17 NOTE — Patient Instructions (Addendum)
Your procedure is scheduled on: Monday 12/22/18.  Report to the [facility] at 8:00am.   Remember: Instructions that are not followed completely may result in serious medical risk, up to and including death, or upon the discretion of your surgeon and anesthesiologist your surgery may need to be rescheduled.      _X__ 1. Do not eat food after midnight the night before your procedure.                 No gum chewing or hard candies. You may drink clear liquids up to 2 hours                 before you are scheduled to arrive for your surgery- DO NOT drink clear                 liquids within 2 hours of the start of your surgery.                 Clear Liquids include:  water, apple juice without pulp, clear carbohydrate                 drink such as Clearfast or Gatorade, Black Coffee or Tea (Do not add                 anything to coffee or tea).    __X__2.  On the morning of surgery brush your teeth with toothpaste and water, you may rinse your mouth with mouthwash if you wish.  Do not swallow any toothpaste or mouthwash.       __X__3.  Notify your doctor if there is any change in your medical condition      (cold, fever, infections).       Do not wear jewelry, make-up, hairpins, clips or nail polish. Do not wear lotions, powders, or perfumes.  Do not shave 48 hours prior to surgery. Men may shave face and neck. Do not bring valuables to the hospital.      [facility] is not responsible for any belongings or valuables.    Contacts, dentures/partials or body piercings may not be worn into surgery. Bring a case for your contacts, glasses or hearing aids, a denture cup will be supplied.     Patients discharged the day of surgery will not be allowed to drive home.     Please read over the following fact sheets that you were given:   MRSA Information    __X__ Take these medicines the morning of surgery with A SIP OF WATER:     1.NONE      __X__ Use CHG Soap  as directed   __X__ Stop Anti-inflammatories 7 days before surgery such as Advil, Ibuprofen, Motrin, BC or Goodies Powder, Naprosyn, Naproxen, Aleve, Aspirin, Meloxicam. May take Tylenol if needed for pain or discomfort.    __X__ Don't start taking any new herbal supplements before your procedure.

## 2018-12-17 NOTE — Pre-Procedure Instructions (Signed)
Pre-Admit Testing Provider Communication Note  Provider: Piscitello  Notification Mode: Secure Chat  Reason: EKG question. Secure Chat Sent: "This patient was a PAT phone call today. Having needle loc breast biopsy on Monday. She had an EKG 04/03/18. History of HTN but only taking BP meds 3-4 times weekly and very low dose. 1/2 tablet of ZESTORETIC10-12.5 MG. Can you peek at that and see if you think it is necessary to bring her in to get another? I think it looks benign and improved from a previous."   Response: "I agree, I think that is fine assuming she is asymtomatic"   Additional Information: Noted on Pre-Admit Worksheet.  Signed: Beulah Gandy, RN

## 2018-12-18 ENCOUNTER — Other Ambulatory Visit
Admission: RE | Admit: 2018-12-18 | Discharge: 2018-12-18 | Disposition: A | Discharge status: Home / Self Care | Payer: Medicare Other | Source: Ambulatory Visit | Attending: Surgery | Admitting: Surgery

## 2018-12-18 DIAGNOSIS — Z01812 Encounter for preprocedural laboratory examination: Secondary | ICD-10-CM | POA: Insufficient documentation

## 2018-12-18 DIAGNOSIS — Z20828 Contact with and (suspected) exposure to other viral communicable diseases: Secondary | ICD-10-CM | POA: Diagnosis not present

## 2018-12-18 LAB — CBC
HCT: 42 % (ref 36.0–46.0)
Hemoglobin: 13.9 g/dL (ref 12.0–15.0)
MCH: 30.4 pg (ref 26.0–34.0)
MCHC: 33.1 g/dL (ref 30.0–36.0)
MCV: 91.9 fL (ref 80.0–100.0)
Platelets: 196 10*3/uL (ref 150–400)
RBC: 4.57 MIL/uL (ref 3.87–5.11)
RDW: 12.7 % (ref 11.5–15.5)
WBC: 5.1 10*3/uL (ref 4.0–10.5)
nRBC: 0 % (ref 0.0–0.2)

## 2018-12-18 LAB — BASIC METABOLIC PANEL
Anion gap: 9 (ref 5–15)
BUN: 26 mg/dL — ABNORMAL HIGH (ref 8–23)
CO2: 25 mmol/L (ref 22–32)
Calcium: 9.6 mg/dL (ref 8.9–10.3)
Chloride: 107 mmol/L (ref 98–111)
Creatinine, Ser: 1.1 mg/dL — ABNORMAL HIGH (ref 0.44–1.00)
GFR calc Af Amer: 56 mL/min — ABNORMAL LOW (ref >60)
GFR calc non Af Amer: 48 mL/min — ABNORMAL LOW (ref >60)
Glucose, Bld: 100 mg/dL — ABNORMAL HIGH (ref 70–99)
Potassium: 3.9 mmol/L (ref 3.5–5.1)
Sodium: 141 mmol/L (ref 135–145)

## 2018-12-18 LAB — SARS CORONAVIRUS 2 (TAT 6-24 HRS): SARS Coronavirus 2: NEGATIVE

## 2018-12-22 ENCOUNTER — Other Ambulatory Visit: Payer: Self-pay

## 2018-12-22 ENCOUNTER — Ambulatory Visit: Payer: Medicare Other | Admitting: Anesthesiology

## 2018-12-22 ENCOUNTER — Ambulatory Visit
Admission: RE | Admit: 2018-12-22 | Discharge: 2018-12-22 | Disposition: A | Discharge status: Home / Self Care | Payer: Medicare Other | Source: Ambulatory Visit | Attending: Surgery | Admitting: Surgery

## 2018-12-22 ENCOUNTER — Other Ambulatory Visit: Payer: Self-pay | Admitting: Surgery

## 2018-12-22 ENCOUNTER — Ambulatory Visit
Admission: RE | Admit: 2018-12-22 | Discharge: 2018-12-22 | Disposition: A | Discharge status: Home / Self Care | Payer: Medicare Other | Attending: Surgery | Admitting: Surgery

## 2018-12-22 ENCOUNTER — Encounter
Admission: RE | Disposition: A | Discharge status: Home / Self Care | Payer: Self-pay | Source: Home / Self Care | Attending: Surgery

## 2018-12-22 ENCOUNTER — Encounter: Payer: Self-pay | Admitting: *Deleted

## 2018-12-22 DIAGNOSIS — Z90710 Acquired absence of both cervix and uterus: Secondary | ICD-10-CM | POA: Diagnosis not present

## 2018-12-22 DIAGNOSIS — R928 Other abnormal and inconclusive findings on diagnostic imaging of breast: Secondary | ICD-10-CM | POA: Insufficient documentation

## 2018-12-22 DIAGNOSIS — I129 Hypertensive chronic kidney disease with stage 1 through stage 4 chronic kidney disease, or unspecified chronic kidney disease: Secondary | ICD-10-CM | POA: Insufficient documentation

## 2018-12-22 DIAGNOSIS — N183 Chronic kidney disease, stage 3 unspecified: Secondary | ICD-10-CM | POA: Diagnosis not present

## 2018-12-22 DIAGNOSIS — Z79899 Other long term (current) drug therapy: Secondary | ICD-10-CM | POA: Insufficient documentation

## 2018-12-22 DIAGNOSIS — N6032 Fibrosclerosis of left breast: Secondary | ICD-10-CM | POA: Diagnosis not present

## 2018-12-22 DIAGNOSIS — D242 Benign neoplasm of left breast: Secondary | ICD-10-CM | POA: Diagnosis not present

## 2018-12-22 HISTORY — PX: BREAST BIOPSY: SHX20

## 2018-12-22 SURGERY — BREAST BIOPSY WITH NEEDLE LOCALIZATION
Anesthesia: General | Laterality: Left

## 2018-12-22 MED ORDER — LIDOCAINE HCL (PF) 2 % IJ SOLN
INTRAMUSCULAR | Status: AC
  Filled 2018-12-22: qty 10

## 2018-12-22 MED ORDER — PROPOFOL 500 MG/50ML IV EMUL
INTRAVENOUS | Status: DC | PRN
  Administered 2018-12-22: 150 ug/kg/min via INTRAVENOUS

## 2018-12-22 MED ORDER — FENTANYL CITRATE (PF) 100 MCG/2ML IJ SOLN
25.0000 ug | INTRAMUSCULAR | Status: DC | PRN
  Administered 2018-12-22 (×3): 25 ug via INTRAVENOUS

## 2018-12-22 MED ORDER — MIDAZOLAM HCL 2 MG/2ML IJ SOLN
INTRAMUSCULAR | Status: AC
  Filled 2018-12-22: qty 2

## 2018-12-22 MED ORDER — DEXAMETHASONE SODIUM PHOSPHATE 10 MG/ML IJ SOLN
INTRAMUSCULAR | Status: AC
  Filled 2018-12-22: qty 1

## 2018-12-22 MED ORDER — FENTANYL CITRATE (PF) 100 MCG/2ML IJ SOLN
INTRAMUSCULAR | Status: DC | PRN
  Administered 2018-12-22 (×2): 25 ug via INTRAVENOUS

## 2018-12-22 MED ORDER — BUPIVACAINE-EPINEPHRINE 0.25% -1:200000 IJ SOLN
INTRAMUSCULAR | Status: DC | PRN
  Administered 2018-12-22: 10 mL

## 2018-12-22 MED ORDER — PROPOFOL 500 MG/50ML IV EMUL
INTRAVENOUS | Status: AC
  Filled 2018-12-22: qty 50

## 2018-12-22 MED ORDER — FENTANYL CITRATE (PF) 100 MCG/2ML IJ SOLN
INTRAMUSCULAR | Status: AC
  Filled 2018-12-22: qty 2

## 2018-12-22 MED ORDER — ONDANSETRON HCL 4 MG/2ML IJ SOLN
INTRAMUSCULAR | Status: DC | PRN
  Administered 2018-12-22: 4 mg via INTRAVENOUS

## 2018-12-22 MED ORDER — LIDOCAINE HCL (CARDIAC) PF 100 MG/5ML IV SOSY
PREFILLED_SYRINGE | INTRAVENOUS | Status: DC | PRN
  Administered 2018-12-22: 80 mg via INTRAVENOUS

## 2018-12-22 MED ORDER — FAMOTIDINE 20 MG PO TABS
ORAL_TABLET | ORAL | Status: AC
  Administered 2018-12-22: 20 mg via ORAL
  Filled 2018-12-22: qty 1

## 2018-12-22 MED ORDER — SCOPOLAMINE 1 MG/3DAYS TD PT72
1.0000 | MEDICATED_PATCH | TRANSDERMAL | Status: DC
  Administered 2018-12-22: 10:00:00 1.5 mg via TRANSDERMAL

## 2018-12-22 MED ORDER — OXYCODONE HCL 5 MG/5ML PO SOLN: 5.0000 mg | Freq: Once | ORAL | Status: DC | PRN

## 2018-12-22 MED ORDER — LACTATED RINGERS IV SOLN
INTRAVENOUS | Status: DC
  Administered 2018-12-22: 10:00:00 via INTRAVENOUS

## 2018-12-22 MED ORDER — PROPOFOL 10 MG/ML IV BOLUS
INTRAVENOUS | Status: DC | PRN
  Administered 2018-12-22: 30 mg via INTRAVENOUS
  Administered 2018-12-22: 130 mg via INTRAVENOUS

## 2018-12-22 MED ORDER — OXYCODONE HCL 5 MG PO TABS: 5.0000 mg | ORAL_TABLET | Freq: Once | ORAL | Status: DC | PRN

## 2018-12-22 MED ORDER — GLYCOPYRROLATE 0.2 MG/ML IJ SOLN
INTRAMUSCULAR | Status: DC | PRN
  Administered 2018-12-22: 0.2 mg via INTRAVENOUS

## 2018-12-22 MED ORDER — GLYCOPYRROLATE 0.2 MG/ML IJ SOLN
INTRAMUSCULAR | Status: AC
  Filled 2018-12-22: qty 1

## 2018-12-22 MED ORDER — FAMOTIDINE 20 MG PO TABS
20.0000 mg | ORAL_TABLET | Freq: Once | ORAL | Status: AC
  Administered 2018-12-22: 10:00:00 20 mg via ORAL

## 2018-12-22 MED ORDER — ONDANSETRON HCL 4 MG/2ML IJ SOLN
INTRAMUSCULAR | Status: AC
  Filled 2018-12-22: qty 2

## 2018-12-22 MED ORDER — PROMETHAZINE HCL 25 MG/ML IJ SOLN: 6.2500 mg | INTRAMUSCULAR | Status: DC | PRN

## 2018-12-22 MED ORDER — SCOPOLAMINE 1 MG/3DAYS TD PT72
MEDICATED_PATCH | TRANSDERMAL | Status: AC
  Administered 2018-12-22: 10:00:00 1.5 mg via TRANSDERMAL
  Filled 2018-12-22: qty 1

## 2018-12-22 MED ORDER — CEFAZOLIN SODIUM-DEXTROSE 2-4 GM/100ML-% IV SOLN
INTRAVENOUS | Status: AC
  Filled 2018-12-22: qty 100

## 2018-12-22 MED ORDER — DEXAMETHASONE SODIUM PHOSPHATE 10 MG/ML IJ SOLN
INTRAMUSCULAR | Status: DC | PRN
  Administered 2018-12-22: 10 mg via INTRAVENOUS

## 2018-12-22 MED ORDER — CHLORHEXIDINE GLUCONATE CLOTH 2 % EX PADS: 6.0000 | MEDICATED_PAD | Freq: Once | CUTANEOUS | Status: DC

## 2018-12-22 MED ORDER — MIDAZOLAM HCL 2 MG/2ML IJ SOLN
INTRAMUSCULAR | Status: DC | PRN
  Administered 2018-12-22: 1 mg via INTRAVENOUS

## 2018-12-22 MED ORDER — CEFAZOLIN SODIUM-DEXTROSE 2-4 GM/100ML-% IV SOLN
2.0000 g | INTRAVENOUS | Status: AC
  Administered 2018-12-22: 2 g via INTRAVENOUS

## 2018-12-22 MED ORDER — PROPOFOL 10 MG/ML IV BOLUS
INTRAVENOUS | Status: AC
  Filled 2018-12-22: qty 20

## 2018-12-22 SURGICAL SUPPLY — 32 items
BLADE SURG 15 STRL LF DISP TIS (BLADE) ×1 IMPLANT
BLADE SURG 15 STRL SS (BLADE) ×2
CANISTER SUCT 1200ML W/VALVE (MISCELLANEOUS) ×3 IMPLANT
CHLORAPREP W/TINT 26 (MISCELLANEOUS) ×3 IMPLANT
CNTNR SPEC 2.5X3XGRAD LEK (MISCELLANEOUS) ×1
CONT SPEC 4OZ STER OR WHT (MISCELLANEOUS) ×2
CONTAINER SPEC 2.5X3XGRAD LEK (MISCELLANEOUS) ×1 IMPLANT
COVER WAND RF STERILE (DRAPES) ×3 IMPLANT
DECANTER SPIKE VIAL GLASS SM (MISCELLANEOUS) ×3 IMPLANT
DERMABOND ADVANCED (GAUZE/BANDAGES/DRESSINGS) ×2
DERMABOND ADVANCED .7 DNX12 (GAUZE/BANDAGES/DRESSINGS) ×1 IMPLANT
DEVICE DUBIN SPECIMEN MAMMOGRA (MISCELLANEOUS) ×3 IMPLANT
DRAPE LAPAROTOMY TRNSV 106X77 (MISCELLANEOUS) ×3 IMPLANT
ELECT CAUTERY BLADE TIP 2.5 (TIP) ×3
ELECT REM PT RETURN 9FT ADLT (ELECTROSURGICAL) ×3
ELECTRODE CAUTERY BLDE TIP 2.5 (TIP) ×1 IMPLANT
ELECTRODE REM PT RTRN 9FT ADLT (ELECTROSURGICAL) ×1 IMPLANT
GLOVE ORTHO TXT STRL SZ7.5 (GLOVE) ×9 IMPLANT
GOWN STRL REUS W/ TWL LRG LVL3 (GOWN DISPOSABLE) ×2 IMPLANT
GOWN STRL REUS W/TWL LRG LVL3 (GOWN DISPOSABLE) ×4
KIT MARKER MARGIN INK (KITS) ×3 IMPLANT
KIT TURNOVER KIT A (KITS) ×3 IMPLANT
NEEDLE HYPO 22GX1.5 SAFETY (NEEDLE) ×3 IMPLANT
PACK BASIN MINOR ARMC (MISCELLANEOUS) ×3 IMPLANT
SHEARS HARMONIC 9CM CVD (BLADE) IMPLANT
SUT MNCRL 4-0 (SUTURE) ×2
SUT MNCRL 4-0 27XMFL (SUTURE) ×1
SUT VIC AB 3-0 SH 27 (SUTURE) ×2
SUT VIC AB 3-0 SH 27X BRD (SUTURE) ×1 IMPLANT
SUTURE MNCRL 4-0 27XMF (SUTURE) ×1 IMPLANT
SYR 10ML LL (SYRINGE) ×3 IMPLANT
WATER STERILE IRR 1000ML POUR (IV SOLUTION) ×3 IMPLANT

## 2018-12-22 NOTE — Anesthesia Post-op Follow-up Note (Signed)
Anesthesia QCDR form completed.        

## 2018-12-22 NOTE — Anesthesia Preprocedure Evaluation (Signed)
Anesthesia Evaluation  Patient identified by MRN, date of birth, ID band Patient awake    Reviewed: Allergy & Precautions, H&P , NPO status , Patient's Chart, lab work & pertinent test results  History of Anesthesia Complications (+) PONV and history of anesthetic complications  Airway Mallampati: III  TM Distance: <3 FB Neck ROM: limited    Dental  (+) Chipped, Poor Dentition   Pulmonary neg pulmonary ROS, neg shortness of breath,           Cardiovascular Exercise Tolerance: Good hypertension, (-) angina(-) Past MI and (-) DOE      Neuro/Psych negative neurological ROS  negative psych ROS   GI/Hepatic negative GI ROS, Neg liver ROS, neg GERD  ,  Endo/Other  negative endocrine ROS  Renal/GU CRFRenal disease     Musculoskeletal   Abdominal   Peds  Hematology negative hematology ROS (+)   Anesthesia Other Findings Past Medical History: No date: Allergy No date: Cataract No date: Cataract     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 No date: Complication of anesthesia No date: Hypertension No date: PONV (postoperative nausea and vomiting) No date: Varicose veins  Past Surgical History: No date: ABDOMINAL HYSTERECTOMY 04/28/2013: ANTERIOR AND POSTERIOR REPAIR; N/A     Comment:  Procedure: REPAIR CYSTOCELE AND RECTOCELE;  Surgeon:               Reece Packer, MD;  Location: [facility];  Service:               Urology;  Laterality: N/A; No date: APPENDECTOMY 12/22/2018: BREAST BIOPSY; Left     Comment:  Needle Localization No date: CATARACT EXTRACTION; Left 04/28/2013: CYSTOSCOPY; N/A     Comment:  Procedure: CYSTOSCOPY;  Surgeon: Reece Packer, MD;              Location: [facility];  Service: Urology;  Laterality: N/A; No date: HERNIA REPAIR; Bilateral     Comment:  inguinal 04/28/2013: VAGINAL PROLAPSE REPAIR; N/A     Comment:  Procedure: VAULT PROLAPSE AND GRAFT  ;  Surgeon: Reece Packer, MD;   Location: [facility];  Service: Urology;                Laterality: N/A; No date: VARICOSE VEIN SURGERY; Right     Comment:  stripping  BMI    Body Mass Index: 23.98 kg/m      Reproductive/Obstetrics negative OB ROS                             Anesthesia Physical Anesthesia Plan  ASA: III  Anesthesia Plan: General LMA   Post-op Pain Management:    Induction: Intravenous  PONV Risk Score and Plan: Dexamethasone, Ondansetron, Midazolam, Treatment may vary due to age or medical condition, Propofol infusion, TIVA and Scopolamine patch - Pre-op  Airway Management Planned: LMA  Additional Equipment:   Intra-op Plan:   Post-operative Plan: Extubation in OR  Informed Consent: I have reviewed the patients History and Physical, chart, labs and discussed the procedure including the risks, benefits and alternatives for the proposed anesthesia with the patient or authorized representative who has indicated his/her understanding and acceptance.     Dental Advisory Given  Plan Discussed with: Anesthesiologist, CRNA and Surgeon  Anesthesia Plan Comments: (Patient consented for risks of anesthesia including but not limited to:  - adverse reactions  to medications - damage to teeth, lips or other oral mucosa - sore throat or hoarseness - Damage to heart, brain, lungs or loss of life  Patient voiced understanding.)        Anesthesia Quick Evaluation

## 2018-12-22 NOTE — Transfer of Care (Signed)
Immediate Anesthesia Transfer of Care Note  Patient: Abigail Acosta  Procedure(s) Performed: Procedure(s): BREAST BIOPSY WITH NEEDLE LOCALIZATION (Left)  Patient Location: PACU  Anesthesia Type:General  Level of Consciousness: sedated  Airway & Oxygen Therapy: Patient Spontanous Breathing and Patient connected to face mask oxygen  Post-op Assessment: Report given to RN and Post -op Vital signs reviewed and stable  Post vital signs: Reviewed and stable  Last Vitals:  Vitals:   12/22/18 1007 12/22/18 1136  BP: (!) 200/88 137/68  Pulse: 70   Resp: 18 18  Temp: (!) 36.1 C (!) 36.3 C  SpO2: 123XX123 [id number]    Complications: No apparent anesthesia complications

## 2018-12-22 NOTE — Interval H&P Note (Signed)
History and Physical Interval Note:  12/22/2018 10:25 AM  Abigail Acosta  has presented today for surgery, with the diagnosis of abnormal left breast imaging.  The various methods of treatment have been discussed with the patient and family. After consideration of risks, benefits and other options for treatment, the patient has consented to  Procedure(s): BREAST BIOPSY WITH NEEDLE LOCALIZATION (Left) as a surgical intervention.  The patient's history has been reviewed, patient examined, no change in status, stable for surgery.  I have reviewed the patient's chart and labs.  Questions were answered to the patient's satisfaction.     Ronny Bacon

## 2018-12-22 NOTE — Op Note (Signed)
  Procedure Date:  12/22/2018  Pre-operative Diagnosis:  Abnormal left mammogram.    Post-operative Diagnosis: Same  Procedure:  Left breast wire-localized lumpectomy.  Surgeon:  Ronny Bacon, M.D., [facility]  Anesthesia:  General endotracheal  Estimated Blood Loss:  5 ml  Specimens:  Medial inferior quadrant breast tissue.   Complications:  none  Indications for Procedure:  This is a 78 y.o. female who presents with core bx from same area discordant with expectations.  The risks of bleeding, infection, injury to surrounding structures, hematoma, seroma, open wound, cosmetic deformity, and the need for further surgery were all discussed with the patient and was willing to proceed.  Prior to this procedure, the patient had undergone wire localization.  Description of Procedure: The patient was correctly identified in the preoperative area and brought into the operating room.  The patient was placed supine with VTE prophylaxis in place.  Appropriate time-outs were performed.  Anesthesia was induced and the patient was intubated.  Appropriate antibiotics were infused.  The left chest and axilla were prepped and draped in usual sterile fashion.   Attention was turned to the needle localization site where an incision was made either encompassing the needle insertion site, or at a strategically determined site for cosmesis, or direct access to the targeted lesion . Elecrocautery was used for dissection around the superficial tissues, and then scissor dissection is used to perform a lumpectomy with adequate margins.    The specmen's orientation is maintained and the the 6 surfaces painted as designated on the accompanying index.  The specimen including the wire was sent to radiology which confirmed an intact wire and prior biopsy site clip.  The cavity was irrigated and hemostasis was assured with electrocautery.   The wound was then closed in two layers with 3-0 Vicryl and 4-0 Monocryl and sealed  with DermaBond.  Local anesthetic was infiltrated into the the cavity, as depot.   The patient was emerged from anesthesia and extubated and brought to the recovery room for further management.  The patient tolerated the procedure well and all counts were correct at the end of the case.   Ronny Bacon, M.D., [facility] 12/22/2018

## 2018-12-22 NOTE — Anesthesia Procedure Notes (Signed)
Procedure Name: LMA Insertion Date/Time: 12/22/2018 10:38 AM Performed by: Doreen Salvage, CRNA Pre-anesthesia Checklist: Patient identified, Patient being monitored, Timeout performed, Emergency Drugs available and Suction available Patient Re-evaluated:Patient Re-evaluated prior to induction Oxygen Delivery Method: Circle system utilized Preoxygenation: Pre-oxygenation with 100% oxygen Induction Type: IV induction Ventilation: Mask ventilation without difficulty LMA: LMA inserted LMA Size: 3.0 Tube type: Oral Number of attempts: 1 Placement Confirmation: positive ETCO2 and breath sounds checked- equal and bilateral Tube secured with: Tape Dental Injury: Teeth and Oropharynx as per pre-operative assessment

## 2018-12-22 NOTE — Discharge Instructions (Signed)
Tissue Adhesive Wound Care Some cuts and wounds can be closed with skin glue (tissue adhesive). Skin glue holds the skin together and helps your wound heal faster. Skin glue goes away on its own as your wound gets better. Follow these instructions at home:  Wound care  Showers are allowed 24 hours after treatment. Do not soak the wound in water. Do not take baths, swim, or use hot tubs. Do not use soaps or creams on your wound.  If a bandage (dressing) was put on the wound: ? Wash your hands with soap and water before you change your bandage. ? Change the bandage as often as told by your doctor. ? Leave skin glue in place. It will fall off on its own after 7-10 days. ? Keep the bandage dry.  Do not scratch, rub, or pick at the skin glue.  Do not put tape over the skin glue. The skin glue could come off when you take the tape off.  Protect the wound from another injury.  Protect the wound from sun and tanning beds. General instructions  Take over-the-counter and prescription medicines only as told by your doctor.  Keep all follow-up visits as told by your doctor. This is important. Get help right away if:  Your wound is red, puffy (swollen), hot, or tender.  You get a rash after the glue is put on.  You have more pain in the wound.  You have a red streak going away from the wound.  You have yellowish-white fluid (pus) coming from the wound.  You have more bleeding.  You have a fever.  You have chills and you start to shake.  You notice a bad smell coming from the wound.  Your wound or skin glue breaks open. This information is not intended to replace advice given to you by your health care provider. Make sure you discuss any questions you have with your health care provider. Document Released: 10/11/2007 Document Revised: 12/14/2016 Document Reviewed: 11/25/2015 Elsevier Patient Education  2020 [place].   Breast Biopsy, Care After These instructions give you  information about caring for yourself after your procedure. Your doctor may also give you more specific instructions. Call your doctor if you have any problems or questions after your procedure. What can I expect after the procedure? After your procedure, it is common to have:  Bruising on your breast.  Numbness, tingling, or pain near your biopsy site. Follow these instructions at home: Medicines  Take over-the-counter and prescription medicines only as told by your doctor.  Do not drive for 24 hours if you were given a medicine to help you relax (sedative) during your procedure.  Do not drink alcohol while taking pain medicine.  Do not drive or use heavy machinery while taking prescription pain medicine. Biopsy site care      Follow instructions from your doctor about how to take care of your cut from surgery (incision) or your puncture area. Make sure you: ? Wash your hands with soap and water before you change your bandage (dressing). If you cannot use soap and water, use hand sanitizer. ? Change your bandage as told by your doctor. ? Leave stitches (sutures), skin glue, or skin tape (adhesive strips) in place. They may need to stay in place for 2 weeks or longer. If tape strips get loose and curl up, you may trim the loose edges. Do not remove tape strips completely unless your doctor says it is okay.  If you have stitches,  keep them dry when you take a bath or a shower.  Check your cut or puncture area every day for signs of infection. Check for: ? Redness, swelling, or pain. ? Fluid or blood. ? Warmth. ? Pus or a bad smell.  Protect the biopsy area. Do not let the area get bumped. Activity  If you had a cut during your procedure, avoid activities that could pull your cut open. These include: ? Stretching. ? Reaching over your head. ? Exercise. ? Sports. ? Lifting anything that weighs more than 3 lb (1.4 kg).  Return to your normal activities as told by your doctor.  Ask your doctor what activities are safe for you. Managing pain, stiffness, and swelling If told, put ice on the biopsy site to relieve swelling:  Put ice in a plastic bag.  Place a towel between your skin and the bag.  Leave the ice on for 20 minutes, 2-3 times a day. General instructions  Continue your normal diet.  Wear a good support bra for as long as told by your doctor.  Get checked for extra fluid around your lymph nodes (lymphedema) as often as told by your doctor.  Keep all follow-up visits as told by your doctor. This is important. Contact a doctor if:  You notice any of the following at the biopsy site: ? More redness, swelling, or pain. ? More fluid or blood coming from the site. ? The site feels warm to the touch. ? Pus or a bad smell coming from the site. ? The site breaks open after the stitches or skin tape strips have been removed.  You have a rash.  You have a fever. Get help right away if:  You have more bleeding from the biopsy site. Get help right away if bleeding is more than a small spot.  You have trouble breathing.  You have red streaks around the biopsy site. Summary  After your procedure, it is common to have bruising, numbness, tingling, or pain near the biopsy site.  Do not drive or use heavy machinery while taking prescription pain medicine.  Wear a good support bra for as long as told by your doctor.  If you had a cut during your procedure, avoid activities that may pull the cut open. Ask your doctor what activities are safe for you. This information is not intended to replace advice given to you by your health care provider. Make sure you discuss any questions you have with your health care provider. Document Released: 10/28/2008 Document Revised: 06/20/2017 Document Reviewed: 06/20/2017 Elsevier Patient Education  2020 [place]   1) The drugs that you were given will stay in  your system until tomorrow so for the next 24 hours you should not:  A) Drive an automobile B) Make any legal decisions C) Drink any alcoholic beverage   2) You may resume regular meals tomorrow.  Today it is better to start with liquids and gradually work up to solid foods.  You may eat anything you prefer, but it is better to start with liquids, then soup and crackers, and gradually work up to solid foods.   3) Please notify your doctor immediately if you have any unusual bleeding, trouble breathing, redness and pain at the surgery site, drainage, fever, or pain not relieved by medication. 4)   5) Your post-operative visit with Dr.  is: Date:                        Time:    Please call to schedule your post-operative visit.  6) Additional Instructions:

## 2018-12-23 ENCOUNTER — Encounter: Payer: Self-pay | Admitting: Surgery

## 2018-12-23 LAB — SURGICAL PATHOLOGY

## 2018-12-23 NOTE — Anesthesia Postprocedure Evaluation (Signed)
Anesthesia Post Note  Patient: Abigail Acosta  Procedure(s) Performed: BREAST BIOPSY WITH NEEDLE LOCALIZATION (Left )  Patient location during evaluation: PACU Anesthesia Type: General Level of consciousness: awake and alert Pain management: pain level controlled Vital Signs Assessment: post-procedure vital signs reviewed and stable Respiratory status: spontaneous breathing, nonlabored ventilation, respiratory function stable and patient connected to nasal cannula oxygen Cardiovascular status: blood pressure returned to baseline and stable Postop Assessment: no apparent nausea or vomiting Anesthetic complications: no     Last Vitals:  Vitals:   12/22/18 1302 12/22/18 1318  BP: (!) 175/68 (!) 175/71  Pulse: 65 66  Resp: 16   Temp: 36.4 C   SpO2: 99%     Last Pain:  Vitals:   12/22/18 1302  TempSrc: Temporal  PainSc: 2                  Precious Haws Piscitello

## 2018-12-24 ENCOUNTER — Telehealth: Payer: Self-pay | Admitting: Surgery

## 2018-12-24 NOTE — Telephone Encounter (Signed)
Reported that her pathology was completely benign.  Will see her in the office.  D/w her husband, Rush Landmark.

## 2018-12-30 ENCOUNTER — Other Ambulatory Visit: Payer: Self-pay

## 2018-12-30 ENCOUNTER — Ambulatory Visit (INDEPENDENT_AMBULATORY_CARE_PROVIDER_SITE_OTHER): Payer: Medicare Other | Admitting: Physician Assistant

## 2018-12-30 ENCOUNTER — Encounter: Payer: Self-pay | Admitting: Physician Assistant

## 2018-12-30 VITALS — BP 164/92 | HR 86 | Temp 97.9°F | Resp 12 | Ht 61.5 in | Wt 130.2 lb

## 2018-12-30 DIAGNOSIS — Z09 Encounter for follow-up examination after completed treatment for conditions other than malignant neoplasm: Secondary | ICD-10-CM

## 2018-12-30 DIAGNOSIS — R928 Other abnormal and inconclusive findings on diagnostic imaging of breast: Secondary | ICD-10-CM

## 2018-12-30 NOTE — Progress Notes (Signed)
[facility] POST-OP OFFICE VISIT  12/30/2018  HPI: Abigail Acosta is a 78 y.o. female 8 days s/p left breast wire-localized lumpectomy for suspicious lesion to the left breast with Dr Christian Mate.  No issues to report She has healed nicely, no pain, erythema or drainage from the incision No new complaints.   Vital signs: BP (!) 164/92   Pulse 86   Temp 97.9 F (36.6 C)   Resp 12   Ht 5' 1.5" (1.562 m)   Wt 130 lb 3.2 oz (59.1 kg)   SpO2 95%   BMI 24.20 kg/m    Physical Exam: Constitutional: Well appearing female, NAD Left Breast: Well healed incision to the 7 o'clock position of the left breast inferior to the nipple, no erythema or drainage, there is some ecchymosis in various stages of healing.    Assessment/Plan: This is a 78 y.o. female 8 days s/p left breast wire-localized lumpectomy for suspicious lesion to the left breast   - No issues with pain  - reviewed wound care  - Reviewed pathology: Fibrosis/Fibroadenomatoid changes, negative for malignancy   - rtc prn, can continue to see CP for annual breast screening. Advised to call with any questions or concerns  -- Edison Simon, [name] [facility] 12/30/2018, 10:21 AM [phone] M-F: 7am - 4pm

## 2018-12-30 NOTE — Patient Instructions (Signed)
Please continue to do self breast exams and get your Yearly mammograms with your Primary Care provider.  Please call the office if you have any questions or concerns.   Breast Self-Awareness Breast self-awareness means being familiar with how your breasts look and feel. It involves checking your breasts regularly and reporting any changes to your health care provider. Practicing breast self-awareness is important. Sometimes changes may not be harmful (are benign), but sometimes a change in your breasts can be a sign of a serious medical problem. It is important to learn how to do this procedure correctly so that you can catch problems early, when treatment is more likely to be successful. All women should practice breast self-awareness, including women who have had breast implants. What you need:  A mirror.  A well-lit room. How to do a breast self-exam A breast self-exam is one way to learn what is normal for your breasts and whether your breasts are changing. To do a breast self-exam: Look for changes  1. Remove all the clothing above your waist. 2. Stand in front of a mirror in a room with good lighting. 3. Put your hands on your hips. 4. Push your hands firmly downward. 5. Compare your breasts in the mirror. Look for differences between them (asymmetry), such as: ? Differences in shape. ? Differences in size. ? Puckers, dips, and bumps in one breast and not the other. 6. Look at each breast for changes in the skin, such as: ? Redness. ? Scaly areas. 7. Look for changes in your nipples, such as: ? Discharge. ? Bleeding. ? Dimpling. ? Redness. ? A change in position. Feel for changes Carefully feel your breasts for lumps and changes. It is best to do this while lying on your back on the floor, and again while sitting or standing in the tub or shower with soapy water on your skin. Feel each breast in the following way: 1. Place the arm on the side of the breast you are examining  above your head. 2. Feel your breast with the other hand. 3. Start in the nipple area and make -inch (2 cm) overlapping circles to feel your breast. Use the pads of your three middle fingers to do this. Apply light pressure, then medium pressure, then firm pressure. The light pressure will allow you to feel the tissue closest to the skin. The medium pressure will allow you to feel the tissue that is a little deeper. The firm pressure will allow you to feel the tissue close to the ribs. 4. Continue the overlapping circles, moving downward over the breast until you feel your ribs below your breast. 5. Move one finger-width toward the center of the body. Continue to use the -inch (2 cm) overlapping circles to feel your breast as you move slowly up toward your collarbone. 6. Continue the up-and-down exam using all three pressures until you reach your armpit.  Write down what you find Writing down what you find can help you remember what to discuss with your health care provider. Write down:  What is normal for each breast.  Any changes that you find in each breast, including: ? The kind of changes you find. ? Any pain or tenderness. ? Size and location of any lumps.  Where you are in your menstrual cycle, if you are still menstruating. General tips and recommendations  Examine your breasts every month.  If you are breastfeeding, the best time to examine your breasts is after a feeding or after  using a breast pump.  If you menstruate, the best time to examine your breasts is 5-7 days after your period. Breasts are generally lumpier during menstrual periods, and it may be more difficult to notice changes.  With time and practice, you will become more familiar with the variations in your breasts and more comfortable with the exam. Contact a health care provider if you:  See a change in the shape or size of your breasts or nipples.  See a change in the skin of your breast or nipples, such as  a reddened or scaly area.  Have unusual discharge from your nipples.  Find a lump or thick area that was not there before.  Have pain in your breasts.  Have any concerns related to your breast health. Summary  Breast self-awareness includes looking for physical changes in your breasts, as well as feeling for any changes within your breasts.  Breast self-awareness should be performed in front of a mirror in a well-lit room.  You should examine your breasts every month. If you menstruate, the best time to examine your breasts is 5-7 days after your menstrual period.  Let your health care provider know of any changes you notice in your breasts, including changes in size, changes on the skin, pain or tenderness, or unusual fluid from your nipples. This information is not intended to replace advice given to you by your health care provider. Make sure you discuss any questions you have with your health care provider. Document Released: 01/01/2005 Document Revised: 08/20/2017 Document Reviewed: 08/20/2017 Elsevier Patient Education  2020 [employer].

## 2019-03-16 ENCOUNTER — Other Ambulatory Visit: Payer: Self-pay

## 2019-03-16 ENCOUNTER — Ambulatory Visit (INDEPENDENT_AMBULATORY_CARE_PROVIDER_SITE_OTHER): Payer: Medicare Other | Admitting: Physician Assistant

## 2019-03-16 ENCOUNTER — Encounter: Payer: Self-pay | Admitting: Physician Assistant

## 2019-03-16 VITALS — BP 165/77 | HR 88 | Temp 96.8°F | Wt 129.0 lb

## 2019-03-16 DIAGNOSIS — I10 Essential (primary) hypertension: Secondary | ICD-10-CM

## 2019-03-16 DIAGNOSIS — J301 Allergic rhinitis due to pollen: Secondary | ICD-10-CM

## 2019-03-16 DIAGNOSIS — K5732 Diverticulitis of large intestine without perforation or abscess without bleeding: Secondary | ICD-10-CM

## 2019-03-16 MED ORDER — MONTELUKAST SODIUM 10 MG PO TABS: 10.0000 mg | ORAL_TABLET | Freq: Every day | ORAL | 1 refills | Status: DC

## 2019-03-16 MED ORDER — AMOXICILLIN-POT CLAVULANATE 875-125 MG PO TABS: 1.0000 | ORAL_TABLET | Freq: Two times a day (BID) | ORAL | 0 refills | Status: DC

## 2019-03-16 MED ORDER — LORATADINE 10 MG PO TABS: 10.0000 mg | ORAL_TABLET | Freq: Every day | ORAL | 1 refills | Status: DC

## 2019-03-16 NOTE — Progress Notes (Signed)
Patient: Abigail Acosta Female    DOB: 1940-12-18   79 y.o.   MRN: [id number] Visit Date: 03/16/2019  Today's Provider: Mar Daring, [name]   Chief Complaint  Patient presents with  . Hypertension  . Allergies   Subjective:     HPI    Hypertension, follow-up:  BP Readings from Last 3 Encounters:  03/16/19 (!) 165/77  12/30/18 (!) 164/92  12/22/18 (!) 175/71    She was last seen for hypertension 6 months ago.  BP at that visit was 181/74. Management since that visit includes No changes. She reports excellent compliance with treatment. She is not having side effects.  She is exercising. She is adherent to low salt diet.   Outside blood pressures are "normal" at home. 145/72 She is experiencing none.  Patient denies chest pain, lower extremity edema, near-syncope and palpitations.   Cardiovascular risk factors include advanced age (older than 95 for men, 92 for women) and hypertension.  Use of agents associated with hypertension: none.     Weight trend: stable Wt Readings from Last 3 Encounters:  03/16/19 129 lb (58.5 kg)  12/30/18 130 lb 3.2 oz (59.1 kg)  12/22/18 129 lb (58.5 kg)    Current diet: in general, a "healthy" diet    ------------------------------------------------------------------------    No Known Allergies   Current Outpatient Medications:  .  acetaminophen (TYLENOL) 500 MG tablet, Take 500-1,000 mg by mouth 2 (two) times daily as needed (back pain.)., Disp: , Rfl:  .  Biotin 1000 MCG tablet, Take 1,000 mcg by mouth daily with lunch., Disp: , Rfl:  .  lisinopril-hydrochlorothiazide (ZESTORETIC) 10-12.5 MG tablet, Take 1 tablet by mouth daily. (Patient taking differently: Take 0.5 tablets by mouth 4 (four) times a week. ), Disp: 90 tablet, Rfl: 1 .  Multiple Vitamin (MULTIVITAMIN WITH MINERALS) TABS tablet, Take 1 tablet by mouth daily with lunch. Women's Multivitamin, Disp: , Rfl:  .  cetirizine (ZYRTEC) 10 MG tablet, Take 10 mg  by mouth daily as needed for allergies. , Disp: , Rfl:   Review of Systems  Constitutional: Negative.   HENT: Positive for postnasal drip. Negative for congestion, ear discharge, ear pain, rhinorrhea, sinus pressure, sinus pain, sneezing, sore throat and tinnitus.   Eyes: Negative.   Respiratory: Positive for cough. Negative for apnea, choking, chest tightness, shortness of breath, wheezing and stridor.   Gastrointestinal: Negative.   Allergic/Immunologic: Positive for environmental allergies.  Neurological: Negative for dizziness, light-headedness, numbness and headaches.    Social History   Tobacco Use  . Smoking status: Never Smoker  . Smokeless tobacco: Never Used  Substance Use Topics  . Alcohol use: No      Objective:   BP (!) 165/77 (BP Location: Left Arm, Patient Position: Sitting, Cuff Size: Normal)   Pulse 88   Temp (!) 96.8 F (36 C) (Temporal)   Wt 129 lb (58.5 kg)   BMI 23.98 kg/m  Vitals:   03/16/19 1334  BP: (!) 165/77  Pulse: 88  Temp: (!) 96.8 F (36 C)  TempSrc: Temporal  Weight: 129 lb (58.5 kg)  Body mass index is 23.98 kg/m.   Physical Exam Vitals reviewed.  Constitutional:      General: She is not in acute distress.    Appearance: Normal appearance. She is well-developed. She is not ill-appearing or diaphoretic.  HENT:     Head: Normocephalic and atraumatic.  Eyes:     General: No scleral icterus. Neck:  Thyroid: No thyromegaly.     Vascular: No carotid bruit or JVD.     Trachea: No tracheal deviation.  Cardiovascular:     Rate and Rhythm: Normal rate and regular rhythm.     Pulses: Normal pulses.     Heart sounds: Normal heart sounds. No murmur. No friction rub. No gallop.   Pulmonary:     Effort: Pulmonary effort is normal. No respiratory distress.     Breath sounds: Normal breath sounds. No wheezing or rales.  Musculoskeletal:     Cervical back: Normal range of motion and neck supple.     Right lower leg: No edema.     Left  lower leg: No edema.  Lymphadenopathy:     Cervical: No cervical adenopathy.  Neurological:     Mental Status: She is alert.      No results found for any visits on 03/16/19.     Assessment & Plan    1. Diverticulitis of large intestine without perforation or abscess without bleeding Patient reports she has h/o diverticulosis and has been having some LLQ pain with increased diarrhea. Suspected diverticulitis. Will treat with Augmentin as below. Call if not improving or worsening.  - amoxicillin-clavulanate (AUGMENTIN) 875-125 MG tablet; Take 1 tablet by mouth 2 (two) times daily.  Dispense: 10 tablet; Refill: 0  2. Seasonal allergic rhinitis due to pollen Restart singulair at bedtime during allergy seasons and change cetirizine to loratadine as below due to patient having HTN.  - montelukast (SINGULAIR) 10 MG tablet; Take 1 tablet (10 mg total) by mouth at bedtime.  Dispense: 90 tablet; Refill: 1 - loratadine (CLARITIN) 10 MG tablet; Take 1 tablet (10 mg total) by mouth daily.  Dispense: 90 tablet; Refill: 1  3. Essential hypertension Elevated today but patient not taking BP medications as prescribed. Advised to take lisinopril-hctz 10-12.5mg  daily.      Mar Daring, [name]  [facility]

## 2019-03-16 NOTE — Patient Instructions (Signed)
Diverticulitis  Diverticulitis is infection or inflammation of small pouches (diverticula) in the colon that form due to a condition called diverticulosis. Diverticula can trap stool (feces) and bacteria, causing infection and inflammation. Diverticulitis may cause severe stomach pain and diarrhea. It may lead to tissue damage in the colon that causes bleeding. The diverticula may also burst (rupture) and cause infected stool to enter other areas of the abdomen. Complications of diverticulitis can include:  Bleeding.  Severe infection.  Severe pain.  Rupture (perforation) of the colon.  Blockage (obstruction) of the colon. What are the causes? This condition is caused by stool becoming trapped in the diverticula, which allows bacteria to grow in the diverticula. This leads to inflammation and infection. What increases the risk? You are more likely to develop this condition if:  You have diverticulosis. The risk for diverticulosis increases if: ? You are overweight or obese. ? You use tobacco products. ? You do not get enough exercise.  You eat a diet that does not include enough fiber. High-fiber foods include fruits, vegetables, beans, nuts, and whole grains. What are the signs or symptoms? Symptoms of this condition may include:  Pain and tenderness in the abdomen. The pain is normally located on the left side of the abdomen, but it may occur in other areas.  Fever and chills.  Bloating.  Cramping.  Nausea.  Vomiting.  Changes in bowel routines.  Blood in your stool. How is this diagnosed? This condition is diagnosed based on:  Your medical history.  A physical exam.  Tests to make sure there is nothing else causing your condition. These tests may include: ? Blood tests. ? Urine tests. ? Imaging tests of the abdomen, including X-rays, ultrasounds, MRIs, or CT scans. How is this treated? Most cases of this condition are mild and can be treated at home.  Treatment may include:  Taking over-the-counter pain medicines.  Following a clear liquid diet.  Taking antibiotic medicines by mouth.  Rest. More severe cases may need to be treated at a hospital. Treatment may include:  Not eating or drinking.  Taking prescription pain medicine.  Receiving antibiotic medicines through an IV tube.  Receiving fluids and nutrition through an IV tube.  Surgery. When your condition is under control, your health care provider may recommend that you have a colonoscopy. This is an exam to look at the entire large intestine. During the exam, a lubricated, bendable tube is inserted into the anus and then passed into the rectum, colon, and other parts of the large intestine. A colonoscopy can show how severe your diverticula are and whether something else may be causing your symptoms. Follow these instructions at home: Medicines  Take over-the-counter and prescription medicines only as told by your health care provider. These include fiber supplements, probiotics, and stool softeners.  If you were prescribed an antibiotic medicine, take it as told by your health care provider. Do not stop taking the antibiotic even if you start to feel better.  Do not drive or use heavy machinery while taking prescription pain medicine. General instructions   Follow a full liquid diet or another diet as directed by your health care provider. After your symptoms improve, your health care provider may tell you to change your diet. He or she may recommend that you eat a diet that contains at least 25 g (25 grams) of fiber daily. Fiber makes it easier to pass stool. Healthy sources of fiber include: ? Berries. One cup contains 4-8 grams of   fiber. ? Beans or lentils. One half cup contains 5-8 grams of fiber. ? Green vegetables. One cup contains 4 grams of fiber.  Exercise for at least 30 minutes, 3 times each week. You should exercise hard enough to raise your heart rate and  break a sweat.  Keep all follow-up visits as told by your health care provider. This is important. You may need a colonoscopy. Contact a health care provider if:  Your pain does not improve.  You have a hard time drinking or eating food.  Your bowel movements do not return to normal. Get help right away if:  Your pain gets worse.  Your symptoms do not get better with treatment.  Your symptoms suddenly get worse.  You have a fever.  You vomit more than one time.  You have stools that are bloody, black, or tarry. Summary  Diverticulitis is infection or inflammation of small pouches (diverticula) in the colon that form due to a condition called diverticulosis. Diverticula can trap stool (feces) and bacteria, causing infection and inflammation.  You are at higher risk for this condition if you have diverticulosis and you eat a diet that does not include enough fiber.  Most cases of this condition are mild and can be treated at home. More severe cases may need to be treated at a hospital.  When your condition is under control, your health care provider may recommend that you have an exam called a colonoscopy. This exam can show how severe your diverticula are and whether something else may be causing your symptoms. This information is not intended to replace advice given to you by your health care provider. Make sure you discuss any questions you have with your health care provider. Document Revised: 12/14/2016 Document Reviewed: 02/04/2016 Elsevier Patient Education  2020 [employer]  

## 2019-04-07 ENCOUNTER — Other Ambulatory Visit: Payer: Self-pay | Admitting: Physician Assistant

## 2019-04-07 DIAGNOSIS — I10 Essential (primary) hypertension: Secondary | ICD-10-CM

## 2019-04-07 NOTE — Telephone Encounter (Signed)
Requested Prescriptions  Pending Prescriptions Disp Refills  . lisinopril-hydrochlorothiazide (ZESTORETIC) 10-12.5 MG tablet [Pharmacy Med Name: LISINOPRIL-HCTZ 10-12.5 MG TAB] 90 tablet 1    Sig: TAKE 1 TABLET BY MOUTH EVERY DAY     Cardiovascular:  ACEI + Diuretic Combos Failed - 04/07/2019  9:30 AM      Failed - Cr in normal range and within 180 days    Creatinine, Ser  Date Value Ref Range Status  12/18/2018 1.10 (H) 0.44 - 1.00 mg/dL Final         Failed - Last BP in normal range    BP Readings from Last 1 Encounters:  03/16/19 (!) 165/77         Passed - Na in normal range and within 180 days    Sodium  Date Value Ref Range Status  12/18/2018 141 135 - 145 mmol/L Final  07/25/2018 142 134 - 144 mmol/L Final         Passed - K in normal range and within 180 days    Potassium  Date Value Ref Range Status  12/18/2018 3.9 3.5 - 5.1 mmol/L Final         Passed - Ca in normal range and within 180 days    Calcium  Date Value Ref Range Status  12/18/2018 9.6 8.9 - 10.3 mg/dL Final         Passed - Patient is not pregnant      Passed - Valid encounter within last 6 months    Recent Outpatient Visits          3 weeks ago Diverticulitis of large intestine without perforation or abscess without bleeding   [place], Clearnce Sorrel, [name]   8 months ago [employer] annual wellness visit, subsequent   [facility], [place], [place]   11 months ago White coat syndrome with diagnosis of hypertension   [facility], [place], [place]   6 years ago Sinusitis, acute   [facility], Loura Back, MD

## 2019-07-15 ENCOUNTER — Telehealth: Payer: Self-pay

## 2019-07-15 NOTE — Telephone Encounter (Signed)
Copied from [place] [id number]. Topic: General - Other >> Jul 15, 2019 10:55 AM Leward Quan A wrote: Reason for CRM: Patient called to speak to [place] Malling in reference to a supplemental insurance that she applied for and was denied. She states that the reason for denial will be sent to [facility] so she would like a call to discuss what her next step should be. Please call Ph# [phone]

## 2019-07-16 NOTE — Telephone Encounter (Signed)
OK I have not received anything yet. I will keep an eye out.

## 2019-08-04 ENCOUNTER — Telehealth: Payer: Self-pay | Admitting: Physician Assistant

## 2019-08-04 NOTE — Telephone Encounter (Signed)
Copied from [place] [id number]. Topic: Medicare AWV >> Aug 04, 2019  1:49 PM Cher Nakai R wrote: Reason for CRM:  Left message for patient to call back and schedule Medicare Annual Wellness Visit (AWV) either virtually or in office.  Last AWV  07/25/2018  Please schedule at anytime with [facility] Advisor.

## 2019-09-11 ENCOUNTER — Telehealth: Payer: Self-pay | Admitting: Physician Assistant

## 2019-09-11 NOTE — Telephone Encounter (Signed)
Copied from [place] [id number]. Topic: Medicare AWV >> Sep 11, 2019 10:36 AM Cher Nakai R wrote: Reason for CRM:  Left message for patient to call back and schedule Medicare Annual Wellness Visit (AWV) either virtually or in office.  Last AWV 07/25/2018  Please schedule at anytime with [facility] Advisor.  If any questions, please contact me at [phone]

## 2019-10-06 NOTE — Progress Notes (Signed)
Subjective:   Abigail Acosta is a 79 y.o. female who presents for Medicare Annual (Subsequent) preventive examination.  I connected with Buel Ream today by telephone and verified that I am speaking with the correct person using two identifiers. Location patient: home Location provider: work Persons participating in the virtual visit: patient, provider.   I discussed the limitations, risks, security and privacy concerns of performing an evaluation and management service by telephone and the availability of in person appointments. I also discussed with the patient that there may be a patient responsible charge related to this service. The patient expressed understanding and verbally consented to this telephonic visit.    Interactive audio and video telecommunications were attempted between this provider and patient, however failed, due to patient having technical difficulties OR patient did not have access to video capability.  We continued and completed visit with audio only.   Review of Systems    N/A  Cardiac Risk Factors include: advanced age (>7men, >86 women);hypertension     Objective:    There were no vitals filed for this visit. There is no height or weight on file to calculate BMI.  Advanced Directives 10/07/2019 12/22/2018 12/17/2018 04/03/2018 07/08/2015 04/28/2013 04/28/2013  Does Patient Have a Medical Advance Directive? Yes Yes Yes Yes Yes Patient has advance directive, copy not in chart Patient has advance directive, copy not in chart  Type of Advance Directive [place];Living will [place];Living will [place];Living will Living will [place];Living will - -  Does patient want to make changes to medical advance directive? - No - Patient declined - No - Patient declined - No change requested No change requested  Copy of [place] in Chart? Yes - validated most recent copy  scanned in chart (See row information) Yes - validated most recent copy scanned in chart (See row information) Yes - validated most recent copy scanned in chart (See row information) - No - copy requested Copy requested from family Copy requested from family  Would patient like information on creating a medical advance directive? No - Patient declined - - - - - -  Pre-existing out of facility DNR order (yellow form or pink MOST form) - - - - - No -    Current Medications (verified) Outpatient Encounter Medications as of 10/07/2019  Medication Sig  . acetaminophen (TYLENOL) 500 MG tablet Take 500-1,000 mg by mouth 2 (two) times daily as needed (back pain.).  Marland Kitchen b complex vitamins capsule Take 1 capsule by mouth daily.  . Biotin 1000 MCG tablet Take 1,000 mcg by mouth daily with lunch.  . cyanocobalamin 1000 MCG tablet Take 1,000 mcg by mouth daily.  Marland Kitchen lisinopril-hydrochlorothiazide (ZESTORETIC) 10-12.5 MG tablet TAKE 1 TABLET BY MOUTH EVERY DAY  . loratadine (CLARITIN) 10 MG tablet Take 1 tablet (10 mg total) by mouth daily.  . montelukast (SINGULAIR) 10 MG tablet Take 1 tablet (10 mg total) by mouth at bedtime.  . Multiple Vitamin (MULTIVITAMIN WITH MINERALS) TABS tablet Take 1 tablet by mouth daily with lunch. Women's Multivitamin  . Turmeric 500 MG CAPS Take 1-2 tablets by mouth as needed (for aches).  Marland Kitchen amoxicillin-clavulanate (AUGMENTIN) 875-125 MG tablet Take 1 tablet by mouth 2 (two) times daily. (Patient not taking: Reported on 10/07/2019)   No facility-administered encounter medications on file as of 10/07/2019.    Allergies (verified) Patient has no known allergies.   History: Past Medical History:  Diagnosis Date  .  Allergy   . Cataract   . Cataract    right eye-not mature  . Complication of anesthesia   . Hypertension   . PONV (postoperative nausea and vomiting)   . Varicose veins    Past Surgical History:  Procedure Laterality Date  . ABDOMINAL HYSTERECTOMY    .  ANTERIOR AND POSTERIOR REPAIR N/A 04/28/2013   Procedure: REPAIR CYSTOCELE AND RECTOCELE;  Surgeon: Reece Packer, MD;  Location: [facility];  Service: Urology;  Laterality: N/A;  . APPENDECTOMY    . BREAST BIOPSY Left 12/22/2018   Needle Localization  . BREAST BIOPSY Left 12/22/2018   Procedure: BREAST BIOPSY WITH NEEDLE LOCALIZATION;  Surgeon: Ronny Bacon, MD;  Location: [facility];  Service: General;  Laterality: Left;  . CATARACT EXTRACTION Left   . CYSTOSCOPY N/A 04/28/2013   Procedure: CYSTOSCOPY;  Surgeon: Reece Packer, MD;  Location: [facility];  Service: Urology;  Laterality: N/A;  . HERNIA REPAIR Bilateral    inguinal  . VAGINAL PROLAPSE REPAIR N/A 04/28/2013   Procedure: VAULT PROLAPSE AND GRAFT  ;  Surgeon: Reece Packer, MD;  Location: [facility];  Service: Urology;  Laterality: N/A;  . VARICOSE VEIN SURGERY Right    stripping   Family History  Adopted: Yes  Problem Relation Age of Onset  . Cancer Mother   . Breast cancer Neg Hx    Social History   Socioeconomic History  . Marital status: Married    Spouse name: Not on file  . Number of children: 5  . Years of education: Not on file  . Highest education level: Some college, no degree  Occupational History  . Occupation: retired  Tobacco Use  . Smoking status: Never Smoker  . Smokeless tobacco: Never Used  Vaping Use  . Vaping Use: Never used  Substance and Sexual Activity  . Alcohol use: No  . Drug use: No  . Sexual activity: Not Currently  Other Topics Concern  . Not on file  Social History Narrative  . Not on file   Social Determinants of Health   Financial Resource Strain: Low Risk   . Difficulty of Paying Living Expenses: Not hard at all  Food Insecurity: No Food Insecurity  . Worried About Charity fundraiser in the Last Year: Never true  . Ran Out of Food in the Last Year: Never true  Transportation Needs: No Transportation Needs  . Lack of Transportation (Medical): No  . Lack of  Transportation (Non-Medical): No  Physical Activity: Sufficiently Active  . Days of Exercise per Week: 7 days  . Minutes of Exercise per Session: 60 min  Stress: No Stress Concern Present  . Feeling of Stress : Not at all  Social Connections: Moderately Integrated  . Frequency of Communication with Friends and Family: More than three times a week  . Frequency of Social Gatherings with Friends and Family: More than three times a week  . Attends Religious Services: More than 4 times per year  . Active Member of Clubs or Organizations: No  . Attends Archivist Meetings: Never  . Marital Status: Married    Tobacco Counseling Counseling given: Not Answered   Clinical Intake:  Pre-visit preparation completed: Yes  Pain : No/denies pain     Nutritional Risks: None Diabetes: No  How often do you need to have someone help you when you read instructions, pamphlets, or other written materials from your doctor or pharmacy?: 1 - Never  Diabetic? No  Interpreter  Needed?: No  Information entered by :: [facility], LPN   Activities of Daily Living In your present state of health, do you have any difficulty performing the following activities: 10/07/2019 12/17/2018  Hearing? Tempie Donning  Comment Wears bilateral hearing aids. -  Vision? N N  Difficulty concentrating or making decisions? N N  Walking or climbing stairs? N Y  Dressing or bathing? N N  Doing errands, shopping? N N  Preparing Food and eating ? N -  Using the Toilet? N -  In the past six months, have you accidently leaked urine? N -  Do you have problems with loss of bowel control? N -  Managing your Medications? N -  Managing your Finances? N -  Housekeeping or managing your Housekeeping? N -  Some recent data might be hidden    Patient Care Team: Mar Daring, [name] as PCP - General (Family Medicine) [place], [place] (Optometry)  Indicate any recent Medical Services you may have received from other  than [facility] providers in the past year (date may be approximate).     Assessment:   This is a routine wellness examination for Abigail Acosta.  Hearing/Vision screen No exam data present  Dietary issues and exercise activities discussed: Current Exercise Habits: Home exercise routine, Type of exercise: walking, Time (Minutes): 60, Frequency (Times/Week): 7, Weekly Exercise (Minutes/Week): 420, Intensity: Mild, Exercise limited by: None identified  Goals    . DIET - INCREASE WATER INTAKE     Recommend to drink at least 6-8 8oz glasses of water per day.      Depression Screen PHQ 2/9 Scores 10/07/2019 07/25/2018 04/21/2018  PHQ - 2 Score 0 0 0  PHQ- 9 Score - 0 -    Fall Risk Fall Risk  10/07/2019 12/30/2018 12/09/2018 07/25/2018  Falls in the past year? 0 0 0 0  Number falls in past yr: 0 - 0 0  Injury with Fall? 0 - 0 -    Any stairs in or around the home? Yes  If so, are there any without handrails? No  Home free of loose throw rugs in walkways, pet beds, electrical cords, etc? Yes  Adequate lighting in your home to reduce risk of falls? Yes   ASSISTIVE DEVICES UTILIZED TO PREVENT FALLS:  Life alert? No  Use of a cane, walker or w/c? No  Grab bars in the bathroom? No  Shower chair or bench in shower? No  Elevated toilet seat or a handicapped toilet? No    Cognitive Function: Declined today.         Immunizations Immunization History  Administered Date(s) Administered  . PFIZER SARS-COV-2 Vaccination 02/04/2019, 02/25/2019    TDAP status: Due, Education has been provided regarding the importance of this vaccine. Advised may receive this vaccine at local pharmacy or Health Dept. Aware to provide a copy of the vaccination record if obtained from local pharmacy or Health Dept. Verbalized acceptance and understanding. Flu Vaccine status: Declined, Education has been provided regarding the importance of this vaccine but patient still declined. Advised may receive this vaccine at  local pharmacy or Health Dept. Aware to provide a copy of the vaccination record if obtained from local pharmacy or Health Dept. Verbalized acceptance and understanding. Pneumococcal vaccine status: Declined,  Education has been provided regarding the importance of this vaccine but patient still declined. Advised may receive this vaccine at local pharmacy or Health Dept. Aware to provide a copy of the vaccination record if obtained from local pharmacy  or Health Dept. Verbalized acceptance and understanding.  Covid-19 vaccine status: Completed vaccines  Qualifies for Shingles Vaccine? Yes   Zostavax completed No   Shingrix Completed?: No.    Education has been provided regarding the importance of this vaccine. Patient has been advised to call insurance company to determine out of pocket expense if they have not yet received this vaccine. Advised may also receive vaccine at local pharmacy or Health Dept. Verbalized acceptance and understanding.  Screening Tests Health Maintenance  Topic Date Due  . PNA vac Low Risk Adult (1 of 2 - PCV13) Never done  . INFLUENZA VACCINE  Never done  . TETANUS/TDAP  10/06/2020 (Originally 08/10/1959)  . COLONOSCOPY  05/30/2020  . DEXA SCAN  11/16/2022  . COVID-19 Vaccine  Completed  . Hepatitis C Screening  Completed    Health Maintenance  Health Maintenance Due  Topic Date Due  . PNA vac Low Risk Adult (1 of 2 - PCV13) Never done  . INFLUENZA VACCINE  Never done    Colorectal cancer screening: Completed 05/31/15. Repeat every 5 years Mammogram status: No longer required.  Bone Density status: Completed 11/15/17. Results reflect: Bone density results: OSTEOPENIA. Repeat every 5 years.  Lung Cancer Screening: (Low Dose CT Chest recommended if Age 70-80 years, 30 pack-year currently smoking OR have quit w/in 15years.) does not qualify.    Additional Screening:  Hepatitis C Screening: Up to date  Vision Screening: Recommended annual ophthalmology exams  for early detection of glaucoma and other disorders of the eye. Is the patient up to date with their annual eye exam?  Yes  Who is the provider or what is the name of the office in which the patient attends annual eye exams? [facility] If pt is not established with a provider, would they like to be referred to a provider to establish care? No .   Dental Screening: Recommended annual dental exams for proper oral hygiene  Community Resource Referral / Chronic Care Management: CRR required this visit?  No   CCM required this visit?  No      Plan:     I have personally reviewed and noted the following in the patient's chart:   . Medical and social history . Use of alcohol, tobacco or illicit drugs  . Current medications and supplements . Functional ability and status . Nutritional status . Physical activity . Advanced directives . List of other physicians . Hospitalizations, surgeries, and ER visits in previous 12 months . Vitals . Screenings to include cognitive, depression, and falls . Referrals and appointments  In addition, I have reviewed and discussed with patient certain preventive protocols, quality metrics, and best practice recommendations. A written personalized care plan for preventive services as well as general preventive health recommendations were provided to patient.     Abigail Acosta, [place]   2/95/1883   Nurse Notes: Pt would like to receive her flu shot at her next in office apt. Pt to check with previous PCP to see if Prevnar 13 and Pneumovax 23 vaccines were given. Requested dates to update chart, if completed.

## 2019-10-07 ENCOUNTER — Ambulatory Visit (INDEPENDENT_AMBULATORY_CARE_PROVIDER_SITE_OTHER): Payer: Medicare Other

## 2019-10-07 ENCOUNTER — Other Ambulatory Visit: Payer: Self-pay

## 2019-10-07 DIAGNOSIS — Z0000 Encounter for general adult medical examination without abnormal findings: Secondary | ICD-10-CM | POA: Diagnosis not present

## 2019-10-07 NOTE — Patient Instructions (Signed)
[name] , Thank you for taking time to come for your Medicare Wellness Visit. I appreciate your ongoing commitment to your health goals. Please review the following plan we discussed and let me know if I can assist you in the future.   Screening recommendations/referrals: Colonoscopy: Up to date, due 11/2022 Mammogram: No longer required.  Bone Density: Up to date, due 11/2022 Recommended yearly ophthalmology/optometry visit for glaucoma screening and checkup Recommended yearly dental visit for hygiene and checkup  Vaccinations: Influenza vaccine: Currently due Pneumococcal vaccine: Pt to retrieve previous records to see if completed. Tdap vaccine: Pt declines today.  Shingles vaccine: Shingrix discussed. Please contact your pharmacy for coverage information.     Advanced directives: Currently on file  Conditions/risks identified: Recommend to drink at least 6-8 8oz glasses of water per day.  Next appointment: 10/09/19 @ 8:40 AM with Fenton Malling. Declined scheduling an AWV for 2022 at this time.    Preventive Care 6 Years and Older, Female Preventive care refers to lifestyle choices and visits with your health care provider that can promote health and wellness. What does preventive care include?  A yearly physical exam. This is also called an annual well check.  Dental exams once or twice a year.  Routine eye exams. Ask your health care provider how often you should have your eyes checked.  Personal lifestyle choices, including:  Daily care of your teeth and gums.  Regular physical activity.  Eating a healthy diet.  Avoiding tobacco and drug use.  Limiting alcohol use.  Practicing safe sex.  Taking low-dose aspirin every day.  Taking vitamin and mineral supplements as recommended by your health care provider. What happens during an annual well check? The services and screenings done by your health care provider during your annual well check will depend on  your age, overall health, lifestyle risk factors, and family history of disease. Counseling  Your health care provider may ask you questions about your:  Alcohol use.  Tobacco use.  Drug use.  Emotional well-being.  Home and relationship well-being.  Sexual activity.  Eating habits.  History of falls.  Memory and ability to understand (cognition).  Work and work Statistician.  Reproductive health. Screening  You may have the following tests or measurements:  Height, weight, and BMI.  Blood pressure.  Lipid and cholesterol levels. These may be checked every 5 years, or more frequently if you are over 82 years old.  Skin check.  Lung cancer screening. You may have this screening every year starting at age 46 if you have a 30-pack-year history of smoking and currently smoke or have quit within the past 15 years.  Fecal occult blood test (FOBT) of the stool. You may have this test every year starting at age 6.  Flexible sigmoidoscopy or colonoscopy. You may have a sigmoidoscopy every 5 years or a colonoscopy every 10 years starting at age 69.  Hepatitis C blood test.  Hepatitis B blood test.  Sexually transmitted disease (STD) testing.  Diabetes screening. This is done by checking your blood sugar (glucose) after you have not eaten for a while (fasting). You may have this done every 1-3 years.  Bone density scan. This is done to screen for osteoporosis. You may have this done starting at age 8.  Mammogram. This may be done every 1-2 years. Talk to your health care provider about how often you should have regular mammograms. Talk with your health care provider about your test results, treatment options, and if  necessary, the need for more tests. Vaccines  Your health care provider may recommend certain vaccines, such as:  Influenza vaccine. This is recommended every year.  Tetanus, diphtheria, and acellular pertussis (Tdap, Td) vaccine. You may need a Td booster  every 10 years.  Zoster vaccine. You may need this after age 75.  Pneumococcal 13-valent conjugate (PCV13) vaccine. One dose is recommended after age 33.  Pneumococcal polysaccharide (PPSV23) vaccine. One dose is recommended after age 12. Talk to your health care provider about which screenings and vaccines you need and how often you need them. This information is not intended to replace advice given to you by your health care provider. Make sure you discuss any questions you have with your health care provider. Document Released: 01/28/2015 Document Revised: 09/21/2015 Document Reviewed: 11/02/2014 Elsevier Interactive Patient Education  2017 [place] Prevention in the Home Falls can cause injuries. They can happen to people of all ages. There are many things you can do to make your home safe and to help prevent falls. What can I do on the outside of my home?  Regularly fix the edges of walkways and driveways and fix any cracks.  Remove anything that might make you trip as you walk through a door, such as a raised step or threshold.  Trim any bushes or trees on the path to your home.  Use bright outdoor lighting.  Clear any walking paths of anything that might make someone trip, such as rocks or tools.  Regularly check to see if handrails are loose or broken. Make sure that both sides of any steps have handrails.  Any raised decks and porches should have guardrails on the edges.  Have any leaves, snow, or ice cleared regularly.  Use sand or salt on walking paths during winter.  Clean up any spills in your garage right away. This includes oil or grease spills. What can I do in the bathroom?  Use night lights.  Install grab bars by the toilet and in the tub and shower. Do not use towel bars as grab bars.  Use non-skid mats or decals in the tub or shower.  If you need to sit down in the shower, use a plastic, non-slip stool.  Keep the floor dry. Clean up any  water that spills on the floor as soon as it happens.  Remove soap buildup in the tub or shower regularly.  Attach bath mats securely with double-sided non-slip rug tape.  Do not have throw rugs and other things on the floor that can make you trip. What can I do in the bedroom?  Use night lights.  Make sure that you have a light by your bed that is easy to reach.  Do not use any sheets or blankets that are too big for your bed. They should not hang down onto the floor.  Have a firm chair that has side arms. You can use this for support while you get dressed.  Do not have throw rugs and other things on the floor that can make you trip. What can I do in the kitchen?  Clean up any spills right away.  Avoid walking on wet floors.  Keep items that you use a lot in easy-to-reach places.  If you need to reach something above you, use a strong step stool that has a grab bar.  Keep electrical cords out of the way.  Do not use floor polish or wax that makes floors slippery. If you must  use wax, use non-skid floor wax.  Do not have throw rugs and other things on the floor that can make you trip. What can I do with my stairs?  Do not leave any items on the stairs.  Make sure that there are handrails on both sides of the stairs and use them. Fix handrails that are broken or loose. Make sure that handrails are as long as the stairways.  Check any carpeting to make sure that it is firmly attached to the stairs. Fix any carpet that is loose or worn.  Avoid having throw rugs at the top or bottom of the stairs. If you do have throw rugs, attach them to the floor with carpet tape.  Make sure that you have a light switch at the top of the stairs and the bottom of the stairs. If you do not have them, ask someone to add them for you. What else can I do to help prevent falls?  Wear shoes that:  Do not have high heels.  Have rubber bottoms.  Are comfortable and fit you well.  Are closed  at the toe. Do not wear sandals.  If you use a stepladder:  Make sure that it is fully opened. Do not climb a closed stepladder.  Make sure that both sides of the stepladder are locked into place.  Ask someone to hold it for you, if possible.  Clearly mark and make sure that you can see:  Any grab bars or handrails.  First and last steps.  Where the edge of each step is.  Use tools that help you move around (mobility aids) if they are needed. These include:  Canes.  Walkers.  Scooters.  Crutches.  Turn on the lights when you go into a dark area. Replace any light bulbs as soon as they burn out.  Set up your furniture so you have a clear path. Avoid moving your furniture around.  If any of your floors are uneven, fix them.  If there are any pets around you, be aware of where they are.  Review your medicines with your doctor. Some medicines can make you feel dizzy. This can increase your chance of falling. Ask your doctor what other things that you can do to help prevent falls. This information is not intended to replace advice given to you by your health care provider. Make sure you discuss any questions you have with your health care provider. Document Released: 10/28/2008 Document Revised: 06/09/2015 Document Reviewed: 02/05/2014 Elsevier Interactive Patient Education  2017 [employer].

## 2019-10-09 ENCOUNTER — Encounter: Payer: Self-pay | Admitting: Physician Assistant

## 2019-10-09 ENCOUNTER — Telehealth: Payer: Self-pay

## 2019-10-09 ENCOUNTER — Ambulatory Visit (INDEPENDENT_AMBULATORY_CARE_PROVIDER_SITE_OTHER): Payer: Medicare Other | Admitting: Physician Assistant

## 2019-10-09 ENCOUNTER — Other Ambulatory Visit: Payer: Self-pay

## 2019-10-09 VITALS — BP 186/86 | HR 60 | Temp 97.7°F | Resp 16 | Ht 61.0 in | Wt 129.4 lb

## 2019-10-09 DIAGNOSIS — N1831 Chronic kidney disease, stage 3a: Secondary | ICD-10-CM

## 2019-10-09 DIAGNOSIS — Z23 Encounter for immunization: Secondary | ICD-10-CM | POA: Diagnosis not present

## 2019-10-09 DIAGNOSIS — D242 Benign neoplasm of left breast: Secondary | ICD-10-CM

## 2019-10-09 DIAGNOSIS — Z8619 Personal history of other infectious and parasitic diseases: Secondary | ICD-10-CM | POA: Diagnosis not present

## 2019-10-09 DIAGNOSIS — R7989 Other specified abnormal findings of blood chemistry: Secondary | ICD-10-CM

## 2019-10-09 DIAGNOSIS — Z1239 Encounter for other screening for malignant neoplasm of breast: Secondary | ICD-10-CM

## 2019-10-09 DIAGNOSIS — J301 Allergic rhinitis due to pollen: Secondary | ICD-10-CM

## 2019-10-09 DIAGNOSIS — I10 Essential (primary) hypertension: Secondary | ICD-10-CM | POA: Diagnosis not present

## 2019-10-09 LAB — POCT URINALYSIS DIPSTICK
Bilirubin, UA: NEGATIVE
Blood, UA: NEGATIVE
Glucose, UA: NEGATIVE
Ketones, UA: NEGATIVE
Leukocytes, UA: NEGATIVE
Nitrite, UA: NEGATIVE
Protein, UA: NEGATIVE
Spec Grav, UA: 1.01 (ref 1.010–1.025)
Urobilinogen, UA: 0.2 E.U./dL
pH, UA: 6 (ref 5.0–8.0)

## 2019-10-09 MED ORDER — LORATADINE 10 MG PO TABS: 10.0000 mg | ORAL_TABLET | Freq: Every day | ORAL | 1 refills | Status: DC

## 2019-10-09 MED ORDER — LISINOPRIL-HYDROCHLOROTHIAZIDE 10-12.5 MG PO TABS: 1.0000 | ORAL_TABLET | Freq: Every day | ORAL | 1 refills | Status: DC

## 2019-10-09 MED ORDER — MONTELUKAST SODIUM 10 MG PO TABS: 10.0000 mg | ORAL_TABLET | Freq: Every day | ORAL | 1 refills | Status: DC

## 2019-10-09 NOTE — Patient Instructions (Addendum)
[facility] [place],  [place]  [address] Main: [phone]   Health Maintenance After Age 78 After age 71, you are at a higher risk for certain long-term diseases and infections as well as injuries from falls. Falls are a major cause of broken bones and head injuries in people who are older than age 34. Getting regular preventive care can help to keep you healthy and well. Preventive care includes getting regular testing and making lifestyle changes as recommended by your health care provider. Talk with your health care provider about:  Which screenings and tests you should have. A screening is a test that checks for a disease when you have no symptoms.  A diet and exercise plan that is right for you. What should I know about screenings and tests to prevent falls? Screening and testing are the best ways to find a health problem early. Early diagnosis and treatment give you the best chance of managing medical conditions that are common after age 52. Certain conditions and lifestyle choices may make you more likely to have a fall. Your health care provider may recommend:  Regular vision checks. Poor vision and conditions such as cataracts can make you more likely to have a fall. If you wear glasses, make sure to get your prescription updated if your vision changes.  Medicine review. Work with your health care provider to regularly review all of the medicines you are taking, including over-the-counter medicines. Ask your health care provider about any side effects that may make you more likely to have a fall. Tell your health care provider if any medicines that you take make you feel dizzy or sleepy.  Osteoporosis screening. Osteoporosis is a condition that causes the bones to get weaker. This can make the bones weak and cause them to break more easily.  Blood pressure screening. Blood pressure changes and medicines to control blood pressure can  make you feel dizzy.  Strength and balance checks. Your health care provider may recommend certain tests to check your strength and balance while standing, walking, or changing positions.  Foot health exam. Foot pain and numbness, as well as not wearing proper footwear, can make you more likely to have a fall.  Depression screening. You may be more likely to have a fall if you have a fear of falling, feel emotionally low, or feel unable to do activities that you used to do.  Alcohol use screening. Using too much alcohol can affect your balance and may make you more likely to have a fall. What actions can I take to lower my risk of falls? General instructions  Talk with your health care provider about your risks for falling. Tell your health care provider if: ? You fall. Be sure to tell your health care provider about all falls, even ones that seem minor. ? You feel dizzy, sleepy, or off-balance.  Take over-the-counter and prescription medicines only as told by your health care provider. These include any supplements.  Eat a healthy diet and maintain a healthy weight. A healthy diet includes low-fat dairy products, low-fat (lean) meats, and fiber from whole grains, beans, and lots of fruits and vegetables. Home safety  Remove any tripping hazards, such as rugs, cords, and clutter.  Install safety equipment such as grab bars in bathrooms and safety rails on stairs.  Keep rooms and walkways well-lit. Activity   Follow a regular exercise program to stay fit. This will help you maintain your balance.  Ask your health care provider what types of exercise are appropriate for you.  If you need a cane or walker, use it as recommended by your health care provider.  Wear supportive shoes that have nonskid soles. Lifestyle  Do not drink alcohol if your health care provider tells you not to drink.  If you drink alcohol, limit how much you have: ? 0-1 drink a day for women. ? 0-2 drinks a  day for men.  Be aware of how much alcohol is in your drink. In the [place] one drink equals one typical bottle of beer (12 oz), one-half glass of wine (5 oz), or one shot of hard liquor (1 oz).  Do not use any products that contain nicotine or tobacco, such as cigarettes and e-cigarettes. If you need help quitting, ask your health care provider. Summary  Having a healthy lifestyle and getting preventive care can help to protect your health and wellness after age 60.  Screening and testing are the best way to find a health problem early and help you avoid having a fall. Early diagnosis and treatment give you the best chance for managing medical conditions that are more common for people who are older than age 58.  Falls are a major cause of broken bones and head injuries in people who are older than age 82. Take precautions to prevent a fall at home.  Work with your health care provider to learn what changes you can make to improve your health and wellness and to prevent falls. This information is not intended to replace advice given to you by your health care provider. Make sure you discuss any questions you have with your health care provider. Document Revised: 04/24/2018 Document Reviewed: 11/14/2016 Elsevier Patient Education  2020 [employer].

## 2019-10-09 NOTE — Progress Notes (Signed)
Established patient visit   Patient: Abigail Acosta   DOB: 09/18/1940   79 y.o. Female  MRN: [id number] Visit Date: 10/09/2019  Today's healthcare provider: Mar Daring, [name]   Chief Complaint  Patient presents with   Hypertension   Subjective    HPI Patient had AWV with NHA Markoski,McKenzie on 10/07/19 Hypertension, follow-up  BP Readings from Last 3 Encounters:  10/09/19 (!) 186/86  03/16/19 (!) 165/77  12/30/18 (!) 164/92   Wt Readings from Last 3 Encounters:  10/09/19 129 lb 6.4 oz (58.7 kg)  03/16/19 129 lb (58.5 kg)  12/30/18 130 lb 3.2 oz (59.1 kg)     She was last seen for hypertension 6 months ago.   BP at that visit was 165/77. Management since that visit includes Advised to take lisinopril-hctz 10-12.5mg  daily.    She reports excellent compliance with treatment. She is not having side effects.  She is following a Low Sodium diet. She is exercising. She does not smoke.  Use of agents associated with hypertension: none.   Outside blood pressures are 120s-150s/60s. Symptoms: No chest pain No chest pressure  No palpitations No syncope  No dyspnea No orthopnea  No paroxysmal nocturnal dyspnea No lower extremity edema   Pertinent labs: Lab Results  Component Value Date   CHOL 200 (H) 07/25/2018   HDL 71 07/25/2018   LDLCALC 112 (H) 07/25/2018   TRIG 85 07/25/2018   CHOLHDL 2.8 07/25/2018   Lab Results  Component Value Date   NA 141 12/18/2018   K 3.9 12/18/2018   CREATININE 1.10 (H) 12/18/2018   GFRNONAA 48 (L) 12/18/2018   GFRAA 56 (L) 12/18/2018   GLUCOSE 100 (H) 12/18/2018     The 10-year ASCVD risk score Mikey Bussing DC Jr., et al., 2013) is: 51.9%   ---------------------------------------------------------------------------------------------------  Patient Active Problem List   Diagnosis Date Noted   Abnormal mammogram of left breast 12/09/2018   S/P hernia surgery 04/21/2018   Hypophosphatemia 04/04/2018   H/O  pyelonephritis 04/03/2018   Elevated LFTs 04/03/2018   CKD (chronic kidney disease) stage 3, GFR 30-59 ml/min 04/03/2018   Essential hypertension 04/03/2018   Cystocele 04/28/2013   Past Medical History:  Diagnosis Date   Allergy    Cataract    Cataract    right eye-not mature   Complication of anesthesia    Hypertension    PONV (postoperative nausea and vomiting)    Varicose veins        Medications: Outpatient Medications Prior to Visit  Medication Sig   acetaminophen (TYLENOL) 500 MG tablet Take 500-1,000 mg by mouth 2 (two) times daily as needed (back pain.).   b complex vitamins capsule Take 1 capsule by mouth daily.   Biotin 1000 MCG tablet Take 1,000 mcg by mouth daily with lunch.   cyanocobalamin 1000 MCG tablet Take 1,000 mcg by mouth daily.   Multiple Vitamin (MULTIVITAMIN WITH MINERALS) TABS tablet Take 1 tablet by mouth daily with lunch. Women's Multivitamin   Turmeric 500 MG CAPS Take 1-2 tablets by mouth as needed (for aches).   [DISCONTINUED] lisinopril-hydrochlorothiazide (ZESTORETIC) 10-12.5 MG tablet TAKE 1 TABLET BY MOUTH EVERY DAY   [DISCONTINUED] loratadine (CLARITIN) 10 MG tablet Take 1 tablet (10 mg total) by mouth daily.   [DISCONTINUED] montelukast (SINGULAIR) 10 MG tablet Take 1 tablet (10 mg total) by mouth at bedtime.   [DISCONTINUED] amoxicillin-clavulanate (AUGMENTIN) 875-125 MG tablet Take 1 tablet by mouth 2 (two) times daily. (Patient not taking: Reported on  10/07/2019)   No facility-administered medications prior to visit.    Review of Systems  Constitutional: Negative.  Negative for appetite change, chills, fatigue and fever.  HENT: Negative.   Eyes: Negative.   Respiratory: Negative.  Negative for chest tightness and shortness of breath.   Cardiovascular: Negative.  Negative for chest pain and palpitations.  Gastrointestinal: Negative.  Negative for abdominal pain, nausea and vomiting.  Endocrine: Negative.     Genitourinary: Negative.   Musculoskeletal: Negative.   Skin: Negative.   Allergic/Immunologic: Negative.   Neurological: Negative.  Negative for dizziness and weakness.  Hematological: Negative.   Psychiatric/Behavioral: Negative.     Last CBC Lab Results  Component Value Date   WBC 5.8 10/09/2019   HGB 14.8 10/09/2019   HCT 43.2 10/09/2019   MCV 90 10/09/2019   MCH 30.8 10/09/2019   RDW 12.0 10/09/2019   PLT 188 93/81/0175   Last metabolic panel Lab Results  Component Value Date   GLUCOSE 85 10/09/2019   NA 142 10/09/2019   K 3.6 10/09/2019   CL 103 10/09/2019   CO2 24 10/09/2019   BUN 26 10/09/2019   CREATININE 1.02 (H) 10/09/2019   GFRNONAA 53 (L) 10/09/2019   GFRAA 61 10/09/2019   CALCIUM 9.6 10/09/2019   PHOS 3.4 07/25/2018   PROT 7.3 10/09/2019   ALBUMIN 4.5 10/09/2019   LABGLOB 2.8 10/09/2019   AGRATIO 1.6 10/09/2019   BILITOT 0.4 10/09/2019   ALKPHOS 83 10/09/2019   AST 28 10/09/2019   ALT 19 10/09/2019   ANIONGAP 9 12/18/2018      Objective    BP (!) 186/86 (BP Location: Left Arm, Patient Position: Sitting, Cuff Size: Large)    Pulse 60    Temp 97.7 F (36.5 C) (Oral)    Resp 16    Ht 5\' 1"  (1.549 m)    Wt 129 lb 6.4 oz (58.7 kg)    SpO2 100%    BMI 24.45 kg/m  BP Readings from Last 3 Encounters:  10/09/19 (!) 186/86  03/16/19 (!) 165/77  12/30/18 (!) 164/92   Wt Readings from Last 3 Encounters:  10/09/19 129 lb 6.4 oz (58.7 kg)  03/16/19 129 lb (58.5 kg)  12/30/18 130 lb 3.2 oz (59.1 kg)      Physical Exam Vitals reviewed.  Constitutional:      General: She is not in acute distress.    Appearance: Normal appearance. She is well-developed and normal weight. She is not ill-appearing or diaphoretic.  Cardiovascular:     Rate and Rhythm: Normal rate and regular rhythm.     Pulses: Normal pulses.     Heart sounds: Normal heart sounds. No murmur heard.  No friction rub. No gallop.   Pulmonary:     Effort: Pulmonary effort is normal. No  respiratory distress.     Breath sounds: Normal breath sounds. No wheezing or rales.  Musculoskeletal:     Cervical back: Normal range of motion and neck supple.     Right lower leg: No edema.     Left lower leg: No edema.  Neurological:     General: No focal deficit present.     Mental Status: She is alert.  Psychiatric:        Mood and Affect: Mood normal.        Thought Content: Thought content normal.       Results for orders placed or performed in visit on 10/09/19  POCT Urinalysis Dipstick  Result Value Ref Range  Color, UA yellow    Clarity, UA clear    Glucose, UA Negative Negative   Bilirubin, UA Negative    Ketones, UA Negative    Spec Grav, UA 1.010 1.010 - 1.025   Blood, UA Negative    pH, UA 6.0 5.0 - 8.0   Protein, UA Negative Negative   Urobilinogen, UA 0.2 0.2 or 1.0 E.U./dL   Nitrite, UA Negative    Leukocytes, UA Negative Negative   Appearance     Odor      Assessment & Plan     1. Essential hypertension Home readings normal. Stable. Diagnosis pulled for medication refill. Continue current medical treatment plan.Will check labs as below and f/u pending results. - CBC w/Diff/Platelet - Comprehensive Metabolic Panel (CMET) - Lipid Panel With LDL/HDL Ratio - lisinopril-hydrochlorothiazide (ZESTORETIC) 10-12.5 MG tablet; Take 1 tablet by mouth daily.  Dispense: 90 tablet; Refill: 1  2. White coat syndrome with diagnosis of hypertension White coat HTN in office. Home readings normal.   3. Stage 3a chronic kidney disease Will check labs as below and f/u pending results. - CBC w/Diff/Platelet - Comprehensive Metabolic Panel (CMET) - Lipid Panel With LDL/HDL Ratio  4. Elevated LFTs Diet controlled. Will check labs as below and f/u pending results. - CBC w/Diff/Platelet - Comprehensive Metabolic Panel (CMET) - Lipid Panel With LDL/HDL Ratio  5. Seasonal allergic rhinitis due to pollen Stable. Diagnosis pulled for medication refill. Continue  current medical treatment plan. - loratadine (CLARITIN) 10 MG tablet; Take 1 tablet (10 mg total) by mouth daily.  Dispense: 90 tablet; Refill: 1 - montelukast (SINGULAIR) 10 MG tablet; Take 1 tablet (10 mg total) by mouth at bedtime.  Dispense: 90 tablet; Refill: 1  6. Encounter for breast cancer screening using non-mammogram modality Breast exam today was normal. There is no family history of breast cancer. She does perform regular self breast exams. Does have personal h/o abnormality in left breast. Mammogram was ordered as below. Information for [facility] Breast clinic was given to patient so she may schedule her mammogram at her convenience. - MM 3D SCREEN BREAST BILATERAL; Future  7. Breast fibroadenoma in female, left See above medical treatment plan. - MM 3D SCREEN BREAST BILATERAL; Future  8. History of sepsis Urosepsis. UA today normal.   9. Need for influenza vaccination Flu vaccine given today without complication. Patient sat upright for 15 minutes to check for adverse reaction before being released. - Flu Vaccine QUAD High Dose(Fluad)  Return in about 1 year (around 10/08/2020) for AWV.      Reynolds Bowl, [name], have reviewed all documentation for this visit. The documentation on 10/13/19 for the exam, diagnosis, procedures, and orders are all accurate and complete.   Rubye Beach  [facility] [phone] (phone) [phone] (fax)  [place]

## 2019-10-09 NOTE — Telephone Encounter (Signed)
-----   Message from Mar Daring, [place] sent at 10/09/2019 10:08 AM EDT ----- Urinalysis is normal.

## 2019-10-09 NOTE — Telephone Encounter (Signed)
Patient advised.

## 2019-10-10 LAB — CBC WITH DIFFERENTIAL/PLATELET
Basophils Absolute: 0 10*3/uL (ref 0.0–0.2)
Basos: 1 %
EOS (ABSOLUTE): 0.2 10*3/uL (ref 0.0–0.4)
Eos: 4 %
Hematocrit: 43.2 % (ref 34.0–46.6)
Hemoglobin: 14.8 g/dL (ref 11.1–15.9)
Immature Grans (Abs): 0 10*3/uL (ref 0.0–0.1)
Immature Granulocytes: 0 %
Lymphocytes Absolute: 1.8 10*3/uL (ref 0.7–3.1)
Lymphs: 31 %
MCH: 30.8 pg (ref 26.6–33.0)
MCHC: 34.3 g/dL (ref 31.5–35.7)
MCV: 90 fL (ref 79–97)
Monocytes Absolute: 0.5 10*3/uL (ref 0.1–0.9)
Monocytes: 8 %
Neutrophils Absolute: 3.3 10*3/uL (ref 1.4–7.0)
Neutrophils: 56 %
Platelets: 188 10*3/uL (ref 150–450)
RBC: 4.8 x10E6/uL (ref 3.77–5.28)
RDW: 12 % (ref 11.7–15.4)
WBC: 5.8 10*3/uL (ref 3.4–10.8)

## 2019-10-10 LAB — LIPID PANEL WITH LDL/HDL RATIO
Cholesterol, Total: 219 mg/dL — ABNORMAL HIGH (ref 100–199)
HDL: 68 mg/dL (ref >39)
LDL Chol Calc (NIH): 139 mg/dL — ABNORMAL HIGH (ref 0–99)
LDL/HDL Ratio: 2 ratio (ref 0.0–3.2)
Triglycerides: 71 mg/dL (ref 0–149)
VLDL Cholesterol Cal: 12 mg/dL (ref 5–40)

## 2019-10-10 LAB — COMPREHENSIVE METABOLIC PANEL
ALT: 19 IU/L (ref 0–32)
AST: 28 IU/L (ref 0–40)
Albumin/Globulin Ratio: 1.6 (ref 1.2–2.2)
Albumin: 4.5 g/dL (ref 3.7–4.7)
Alkaline Phosphatase: 83 IU/L (ref 44–121)
BUN/Creatinine Ratio: 25 (ref 12–28)
BUN: 26 mg/dL (ref 8–27)
Bilirubin Total: 0.4 mg/dL (ref 0.0–1.2)
CO2: 24 mmol/L (ref 20–29)
Calcium: 9.6 mg/dL (ref 8.7–10.3)
Chloride: 103 mmol/L (ref 96–106)
Creatinine, Ser: 1.02 mg/dL — ABNORMAL HIGH (ref 0.57–1.00)
GFR calc Af Amer: 61 mL/min/{1.73_m2} (ref >59)
GFR calc non Af Amer: 53 mL/min/{1.73_m2} — ABNORMAL LOW (ref >59)
Globulin, Total: 2.8 g/dL (ref 1.5–4.5)
Glucose: 85 mg/dL (ref 65–99)
Potassium: 3.6 mmol/L (ref 3.5–5.2)
Sodium: 142 mmol/L (ref 134–144)
Total Protein: 7.3 g/dL (ref 6.0–8.5)

## 2019-10-12 ENCOUNTER — Telehealth: Payer: Self-pay

## 2019-10-12 NOTE — Telephone Encounter (Signed)
-----   Message from Mar Daring, [place] sent at 10/12/2019  1:08 PM EDT ----- Blood count is normal. Kidney function has improved from last check. Liver enzymes are normal. Sugar is normal. Sodium, potassium, and calcium are normal. Cholesterol has had a slight increased compared to last year, but still ok. Continue limiting fatty foods, red meats, processed meats and sugars from diet.

## 2019-10-12 NOTE — Telephone Encounter (Signed)
LMTCB-if patient calls back ok for [facility] nurse to give results °

## 2019-10-13 DIAGNOSIS — I10 Essential (primary) hypertension: Secondary | ICD-10-CM | POA: Insufficient documentation

## 2019-10-13 NOTE — Telephone Encounter (Signed)
Blood count is normal. Kidney function has improved from last check. Liver enzymes are normal. Sugar is normal. Sodium, potassium, and calcium are normal. Cholesterol has had a slight increased compared to last year, but still ok. Continue limiting fatty foods, red meats, processed meats and sugars from diet.  Written by Mar Daring, [name] on 10/12/2019 1:08 PM EDT Seen by patient Abigail Acosta on 10/12/2019 1:48 PM

## 2019-12-28 ENCOUNTER — Ambulatory Visit: Payer: Medicare Other | Admitting: Dermatology

## 2020-02-22 ENCOUNTER — Other Ambulatory Visit: Payer: Self-pay

## 2020-02-22 ENCOUNTER — Ambulatory Visit (INDEPENDENT_AMBULATORY_CARE_PROVIDER_SITE_OTHER): Payer: Medicare Other | Admitting: Dermatology

## 2020-02-22 DIAGNOSIS — L820 Inflamed seborrheic keratosis: Secondary | ICD-10-CM | POA: Diagnosis not present

## 2020-02-22 DIAGNOSIS — L578 Other skin changes due to chronic exposure to nonionizing radiation: Secondary | ICD-10-CM

## 2020-02-22 DIAGNOSIS — L570 Actinic keratosis: Secondary | ICD-10-CM | POA: Diagnosis not present

## 2020-02-22 DIAGNOSIS — L821 Other seborrheic keratosis: Secondary | ICD-10-CM

## 2020-02-22 NOTE — Patient Instructions (Signed)

## 2020-02-22 NOTE — Progress Notes (Unsigned)
   Follow-Up Visit   Subjective  Abigail Acosta is a 80 y.o. female who presents for the following: Skin Problem (Patient here today to have spots at legs and face checked. The spots at leg get itchy. No hx of skin cancer. ).  The following portions of the chart were reviewed this encounter and updated as appropriate:   Tobacco  Allergies  Meds  Problems  Med Hx  Surg Hx  Fam Hx     Review of Systems:  No other skin or systemic complaints except as noted in HPI or Assessment and Plan.  Objective  Well appearing patient in no apparent distress; mood and affect are within normal limits.  A focused examination was performed including legs, face, arms, neck. Relevant physical exam findings are noted in the Assessment and Plan.  Objective  right post distal calf x 1, left post lateral ankle x 1, left zygoma x 1, left bicep x 1 (4): Erythematous keratotic or waxy stuck-on papule or plaque.   Objective  left forehead x 1: Erythematous thin papules/macules with gritty scale.    Assessment & Plan  Inflamed seborrheic keratosis (4) right post distal calf x 1, left post lateral ankle x 1, left zygoma x 1, left bicep x 1  Destruction of lesion - right post distal calf x 1, left post lateral ankle x 1, left zygoma x 1, left bicep x 1 Complexity: simple   Destruction method: cryotherapy   Informed consent: discussed and consent obtained   Timeout:  patient name, date of birth, surgical site, and procedure verified Lesion destroyed using liquid nitrogen: Yes   Region frozen until ice ball extended beyond lesion: Yes   Outcome: patient tolerated procedure well with no complications   Post-procedure details: wound care instructions given    AK (actinic keratosis) left forehead x 1  Destruction of lesion - left forehead x 1 Complexity: simple   Destruction method: cryotherapy   Informed consent: discussed and consent obtained   Timeout:  patient name, date of birth, surgical site,  and procedure verified Lesion destroyed using liquid nitrogen: Yes   Region frozen until ice ball extended beyond lesion: Yes   Outcome: patient tolerated procedure well with no complications   Post-procedure details: wound care instructions given     Seborrheic Keratoses - Stuck-on, waxy, tan-brown papules and plaques  - Discussed benign etiology and prognosis. - Observe - Call for any changes  Actinic Damage - chronic, secondary to cumulative UV radiation exposure/sun exposure over time - diffuse scaly erythematous macules with underlying dyspigmentation - Recommend daily broad spectrum sunscreen SPF 30+ to sun-exposed areas, reapply every 2 hours as needed.  - Call for new or changing lesions.  Return in about 1 year (around 02/21/2021).  Graciella Belton, RMA, am acting as scribe for Sarina Ser, MD . Documentation: I have reviewed the above documentation for accuracy and completeness, and I agree with the above.  Sarina Ser, MD

## 2020-02-25 ENCOUNTER — Encounter: Payer: Self-pay | Admitting: Dermatology

## 2020-07-01 ENCOUNTER — Encounter: Payer: Self-pay | Admitting: Family Medicine

## 2020-07-01 ENCOUNTER — Other Ambulatory Visit: Payer: Self-pay

## 2020-07-01 ENCOUNTER — Ambulatory Visit (INDEPENDENT_AMBULATORY_CARE_PROVIDER_SITE_OTHER): Payer: Medicare Other | Admitting: Family Medicine

## 2020-07-01 VITALS — BP 157/81 | HR 75 | Temp 97.5°F | Resp 16 | Wt 131.2 lb

## 2020-07-01 DIAGNOSIS — Z86010 Personal history of colonic polyps: Secondary | ICD-10-CM | POA: Diagnosis not present

## 2020-07-01 DIAGNOSIS — I10 Essential (primary) hypertension: Secondary | ICD-10-CM | POA: Diagnosis not present

## 2020-07-01 DIAGNOSIS — Z1211 Encounter for screening for malignant neoplasm of colon: Secondary | ICD-10-CM | POA: Diagnosis not present

## 2020-07-01 DIAGNOSIS — Z1231 Encounter for screening mammogram for malignant neoplasm of breast: Secondary | ICD-10-CM

## 2020-07-01 NOTE — Progress Notes (Signed)
Established patient visit   Patient: Abigail Acosta   DOB: [birth year]   80 y.o. Female  MRN: [id number] Visit Date: 07/01/2020  Today's healthcare provider: Lelon Huh, MD   Chief Complaint  Patient presents with   Hypertension   Subjective    HPI  Hypertension, follow-up  BP Readings from Last 3 Encounters:  07/01/20 (!) 157/81  10/09/19 (!) 186/86  03/16/19 (!) 165/77   Wt Readings from Last 3 Encounters:  07/01/20 131 lb 3.2 oz (59.5 kg)  10/09/19 129 lb 6.4 oz (58.7 kg)  03/16/19 129 lb (58.5 kg)     She was last seen for hypertension 9 months ago (seen by Fenton Malling, [name]).  BP at that visit was 186/86. Management since that visit includes continue same medications.  She reports fair compliance with treatment. Patient only takes medication when she feels like she needs it. She checks her blood pressure every day at home and SBP rarely gets up to 140. She has history dizziness near syncope from low pressures.   She is not having side effects.  She is following a Regular diet. She is exercising. She does not smoke.  Use of agents associated with hypertension: none.   Outside blood pressures are 143/74 this morning. Symptoms: No chest pain No chest pressure  No palpitations No syncope  No dyspnea No orthopnea  No paroxysmal nocturnal dyspnea No lower extremity edema   Pertinent labs: Lab Results  Component Value Date   CHOL 219 (H) 10/09/2019   HDL 68 10/09/2019   LDLCALC 139 (H) 10/09/2019   TRIG 71 10/09/2019   CHOLHDL 2.8 07/25/2018   Lab Results  Component Value Date   NA 142 10/09/2019   K 3.6 10/09/2019   CREATININE 1.02 (H) 10/09/2019   GFRNONAA 53 (L) 10/09/2019   GFRAA 61 10/09/2019   GLUCOSE 85 10/09/2019     The 10-year ASCVD risk score Mikey Bussing DC Jr., et al., 2013) is: 40.3%   ---------------------------------------------------------------------------------------------------      Medications: Outpatient Medications  Prior to Visit  Medication Sig   acetaminophen (TYLENOL) 500 MG tablet Take 500-1,000 mg by mouth 2 (two) times daily as needed (back pain.).   b complex vitamins capsule Take 1 capsule by mouth daily.   Biotin 1000 MCG tablet Take 1,000 mcg by mouth daily with lunch.   cyanocobalamin 1000 MCG tablet Take 1,000 mcg by mouth daily.   lisinopril-hydrochlorothiazide (ZESTORETIC) 10-12.5 MG tablet Take 1 tablet by mouth daily.   loratadine (CLARITIN) 10 MG tablet Take 1 tablet (10 mg total) by mouth daily.   montelukast (SINGULAIR) 10 MG tablet Take 1 tablet (10 mg total) by mouth at bedtime.   Multiple Vitamin (MULTIVITAMIN WITH MINERALS) TABS tablet Take 1 tablet by mouth daily with lunch. Women's Multivitamin   [DISCONTINUED] Turmeric 500 MG CAPS Take 1-2 tablets by mouth as needed (for aches). (Patient not taking: Reported on 07/01/2020)   No facility-administered medications prior to visit.    Review of Systems  Constitutional:  Negative for appetite change, chills, fatigue and fever.  Respiratory:  Negative for chest tightness and shortness of breath.   Cardiovascular:  Negative for chest pain and palpitations.  Gastrointestinal:  Negative for abdominal pain, nausea and vomiting.  Neurological:  Negative for dizziness and weakness.      Objective    BP (!) 157/81 (BP Location: Right Arm)   Pulse 75   Temp (!) 97.5 F (36.4 C) (Temporal)   Resp 16  Wt 131 lb 3.2 oz (59.5 kg)   BMI 24.79 kg/m     Physical Exam  General appearance: Well developed, well nourished female, cooperative and in no acute distress Head: Normocephalic, without obvious abnormality, atraumatic Respiratory: Respirations even and unlabored, normal respiratory rate Extremities: All extremities are intact.  Skin: Skin color, texture, turgor normal. No rashes seen  Psych: Appropriate mood and affect. Neurologic: Mental status: Alert, oriented to person, place, and time, thought content appropriate.      Assessment & Plan     1. Colon cancer screening  - Ambulatory referral to gastroenterology for colonoscopy  2. History of adenomatous polyp of colon   3. White coat syndrome with diagnosis of hypertension Doing well with current BP medications. Patient closely monitors BP at home to keep SPB under 140.   4. Encounter for screening mammogram for malignant neoplasm of breast  - MM 3D SCREEN BREAST BILATERAL; Future   Discusses pneumonia vaccine which she states she had in [place] several years ago. Will check records to see if there is documentation of Prevnar. It's likely she only had Pneumovax if it was given when she turned 89.       The entirety of the information documented in the History of Present Illness, Review of Systems and Physical Exam were personally obtained by me. Portions of this information were initially documented by the CMA and reviewed by me for thoroughness and accuracy.     Lelon Huh, MD  [facility] [phone] (phone) [phone] (fax)  [place]

## 2020-07-01 NOTE — Patient Instructions (Signed)
Please review the attached list of medications and notify my office if there are any errors.   Please call the [facility] [phone]) to schedule a routine screening mammogram.  Call [facility] at [phone] if you don't get a call from their office within a week

## 2020-07-13 ENCOUNTER — Encounter: Payer: Self-pay | Admitting: *Deleted

## 2020-08-09 ENCOUNTER — Telehealth: Payer: Medicare Other

## 2020-08-10 ENCOUNTER — Telehealth (INDEPENDENT_AMBULATORY_CARE_PROVIDER_SITE_OTHER): Payer: Medicare Other | Admitting: Gastroenterology

## 2020-08-10 DIAGNOSIS — Z5329 Procedure and treatment not carried out because of patient's decision for other reasons: Secondary | ICD-10-CM

## 2020-08-11 NOTE — Progress Notes (Signed)
Pt cancelled appointment.

## 2020-10-05 ENCOUNTER — Ambulatory Visit (INDEPENDENT_AMBULATORY_CARE_PROVIDER_SITE_OTHER): Payer: Medicare Other | Admitting: Family Medicine

## 2020-10-05 ENCOUNTER — Encounter: Payer: Self-pay | Admitting: Family Medicine

## 2020-10-05 ENCOUNTER — Other Ambulatory Visit: Payer: Self-pay

## 2020-10-05 VITALS — BP 199/74 | HR 60 | Temp 97.5°F | Resp 16 | Wt 131.0 lb

## 2020-10-05 DIAGNOSIS — K219 Gastro-esophageal reflux disease without esophagitis: Secondary | ICD-10-CM

## 2020-10-05 DIAGNOSIS — I10 Essential (primary) hypertension: Secondary | ICD-10-CM | POA: Diagnosis not present

## 2020-10-05 DIAGNOSIS — J301 Allergic rhinitis due to pollen: Secondary | ICD-10-CM

## 2020-10-05 DIAGNOSIS — N1831 Chronic kidney disease, stage 3a: Secondary | ICD-10-CM

## 2020-10-05 MED ORDER — LISINOPRIL-HYDROCHLOROTHIAZIDE 10-12.5 MG PO TABS: 0.5000 | ORAL_TABLET | Freq: Every morning | ORAL | Status: DC

## 2020-10-05 MED ORDER — OMEPRAZOLE 20 MG PO CPDR: 20.0000 mg | DELAYED_RELEASE_CAPSULE | Freq: Every day | ORAL | 3 refills | Status: DC

## 2020-10-05 MED ORDER — AMLODIPINE BESYLATE 2.5 MG PO TABS
2.5000 mg | ORAL_TABLET | Freq: Every evening | ORAL | 0 refills | Status: DC
Start: 2020-10-05 — End: 2021-01-02

## 2020-10-05 MED ORDER — LORATADINE 10 MG PO TABS
10.0000 mg | ORAL_TABLET | Freq: Every day | ORAL | 2 refills | Status: DC
Start: 2020-10-05 — End: 2022-04-26

## 2020-10-05 MED ORDER — MONTELUKAST SODIUM 10 MG PO TABS
10.0000 mg | ORAL_TABLET | Freq: Every day | ORAL | 2 refills | Status: DC
Start: 2020-10-05 — End: 2021-10-06

## 2020-10-05 NOTE — Patient Instructions (Addendum)
Please review the attached list of medications and notify my office if there are any errors.   Your home blood pressure should stay consistently between 110-130. Start taking a low dose amlodipine at night and just 1/2 of the lisinopril-hctz in the morning.

## 2020-10-05 NOTE — Progress Notes (Signed)
Established patient visit   Patient: Abigail Acosta   DOB: 02-04-1940   80 y.o. Female  MRN: [id number] Visit Date: 10/05/2020  Today's healthcare provider: Lelon Huh, MD   Chief Complaint  Patient presents with   Hypertension   Subjective    HPI  White coat syndrome with diagnosis of hypertension  follow-up:  BP Readings from Last 3 Encounters:  10/05/20 (!) 199/74  07/01/20 (!) 157/81  10/09/19 (!) 186/86   Wt Readings from Last 3 Encounters:  10/05/20 131 lb (59.4 kg)  07/01/20 131 lb 3.2 oz (59.5 kg)  10/09/19 129 lb 6.4 oz (58.7 kg)     She was last seen for hypertension 3 months ago.  BP at that visit was 157/81. Management since that visit includes continue same medication.  She states she usually only take 1/2 of lisinopril-hctz in the morning, but sometimes she skips it all together if her BP is lower than usual, and sometimes she takes a full tablet if it is higher that usual. She insists that her blood pressures only run high when she goes to the doctor's office and in the past she had low Bps and passed out when stronger blood pressure medications were prescribed.   Use of agents associated with hypertension: none.   Outside blood pressures are checked and the readings vary. Average blood pressure ranges from 140-145/ 65-70. Symptoms: No chest pain No chest pressure  No palpitations No syncope  No dyspnea No orthopnea  No paroxysmal nocturnal dyspnea No lower extremity edema   Pertinent labs: Lab Results  Component Value Date   CHOL 219 (H) 10/09/2019   HDL 68 10/09/2019   LDLCALC 139 (H) 10/09/2019   TRIG 71 10/09/2019   CHOLHDL 2.8 07/25/2018   Lab Results  Component Value Date   NA 142 10/09/2019   K 3.6 10/09/2019   CREATININE 1.02 (H) 10/09/2019   GFRNONAA 53 (L) 10/09/2019   GFRAA 61 10/09/2019   GLUCOSE 85 10/09/2019     The 10-year ASCVD risk score (Arnett DK, et al., 2019) is: 60.8%    ---------------------------------------------------------------------------------------------------     Medications: Outpatient Medications Prior to Visit  Medication Sig   acetaminophen (TYLENOL) 500 MG tablet Take 500-1,000 mg by mouth 2 (two) times daily as needed (back pain.).   b complex vitamins capsule Take 1 capsule by mouth daily.   Biotin 1000 MCG tablet Take 1,000 mcg by mouth daily with lunch.   cyanocobalamin 1000 MCG tablet Take 1,000 mcg by mouth daily.   lisinopril-hydrochlorothiazide (ZESTORETIC) 10-12.5 MG tablet Take 1 tablet by mouth daily. (Patient taking differently: Take 0.5 tablets by mouth daily.)   loratadine (CLARITIN) 10 MG tablet Take 1 tablet (10 mg total) by mouth daily.   montelukast (SINGULAIR) 10 MG tablet Take 1 tablet (10 mg total) by mouth at bedtime.   Multiple Vitamin (MULTIVITAMIN WITH MINERALS) TABS tablet Take 1 tablet by mouth daily with lunch. Women's Multivitamin   No facility-administered medications prior to visit.    Review of Systems  Constitutional:  Negative for appetite change, chills, fatigue and fever.  Respiratory:  Negative for chest tightness and shortness of breath.   Cardiovascular:  Negative for chest pain and palpitations.  Gastrointestinal:  Negative for abdominal pain, nausea and vomiting.  Neurological:  Negative for dizziness and weakness.      Objective    BP (!) 199/74 (BP Location: Right Arm, Patient Position: Sitting, Cuff Size: Normal)   Pulse 60  Temp (!) 97.5 F (36.4 C) (Temporal)   Resp 16   Wt 131 lb (59.4 kg)   BMI 24.75 kg/m  {Show previous vital signs (optional):23777} Today's Vitals   10/05/20 0812 10/05/20 0814  BP: (!) 200/74 (!) 199/74  Pulse: 60   Resp: 16   Temp: (!) 97.5 F (36.4 C)   TempSrc: Temporal   Weight: 131 lb (59.4 kg)    Body mass index is 24.75 kg/m.    Physical Exam  General appearance: Well developed, well nourished female, cooperative and in no acute  distress Head: Normocephalic, without obvious abnormality, atraumatic Respiratory: Respirations even and unlabored, normal respiratory rate Extremities: All extremities are intact.  Skin: Skin color, texture, turgor normal. No rashes seen  Psych: Appropriate mood and affect. Neurologic: Mental status: Alert, oriented to person, place, and time, thought content appropriate.   No results found for any visits on 10/05/20.  Assessment & Plan     1. Essential hypertension She reports home BP much lower than in office readings, but most are still elevated around the 150 range. Home Bps also seem to be labile so she sometimes skips he BP medications, sometimes takes a full tablet, but usually only takes 1/2 tablet a day. I expect her BP to would be less labile if she take low doses about 12 hours apart. Will try 1/2 tablet of the lisinipril-hctz in the morning and add low dose amlodipine 2.5mg  in the evenings.   Check  - CBC - Comprehensive metabolic panel - Lipid panel  2. Stage 3a chronic kidney disease ([place])   3. Seasonal allergic rhinitis due to pollen refill- loratadine (CLARITIN) 10 MG tablet; Take 1 tablet (10 mg total) by mouth daily.  Dispense: 90 tablet; Refill: 2 - montelukast (SINGULAIR) 10 MG tablet; Take 1 tablet (10 mg total) by mouth at bedtime.  Dispense: 90 tablet; Refill: 2  4. Gastroesophageal reflux disease, unspecified whether esophagitis present She has been taking OTC omeprazole which has been effective. She request prescription.  - omeprazole (PRILOSEC) 20 MG capsule; Take 1 capsule (20 mg total) by mouth daily.  Dispense: 30 capsule; Refill: 3        The entirety of the information documented in the History of Present Illness, Review of Systems and Physical Exam were personally obtained by me. Portions of this information were initially documented by the CMA and reviewed by me for thoroughness and accuracy.     Lelon Huh, MD  [facility] [phone] (phone) [phone] (fax)  [place]

## 2020-10-07 LAB — LIPID PANEL
Chol/HDL Ratio: 3.6 ratio (ref 0.0–4.4)
Cholesterol, Total: 266 mg/dL — ABNORMAL HIGH (ref 100–199)
HDL: 73 mg/dL (ref >39)
LDL Chol Calc (NIH): 182 mg/dL — ABNORMAL HIGH (ref 0–99)
Triglycerides: 69 mg/dL (ref 0–149)
VLDL Cholesterol Cal: 11 mg/dL (ref 5–40)

## 2020-10-07 LAB — COMPREHENSIVE METABOLIC PANEL
ALT: 27 IU/L (ref 0–32)
AST: 38 IU/L (ref 0–40)
Albumin/Globulin Ratio: 2.1 (ref 1.2–2.2)
Albumin: 5 g/dL — ABNORMAL HIGH (ref 3.7–4.7)
Alkaline Phosphatase: 74 IU/L (ref 44–121)
BUN/Creatinine Ratio: 21 (ref 12–28)
BUN: 25 mg/dL (ref 8–27)
Bilirubin Total: 0.5 mg/dL (ref 0.0–1.2)
CO2: 24 mmol/L (ref 20–29)
Calcium: 10 mg/dL (ref 8.7–10.3)
Chloride: 103 mmol/L (ref 96–106)
Creatinine, Ser: 1.18 mg/dL — ABNORMAL HIGH (ref 0.57–1.00)
Globulin, Total: 2.4 g/dL (ref 1.5–4.5)
Glucose: 93 mg/dL (ref 65–99)
Potassium: 4.3 mmol/L (ref 3.5–5.2)
Sodium: 141 mmol/L (ref 134–144)
Total Protein: 7.4 g/dL (ref 6.0–8.5)
eGFR: 47 mL/min/{1.73_m2} — ABNORMAL LOW (ref >59)

## 2020-10-07 LAB — CBC
Hematocrit: 45.3 % (ref 34.0–46.6)
Hemoglobin: 15.1 g/dL (ref 11.1–15.9)
MCH: 30.3 pg (ref 26.6–33.0)
MCHC: 33.3 g/dL (ref 31.5–35.7)
MCV: 91 fL (ref 79–97)
Platelets: 182 10*3/uL (ref 150–450)
RBC: 4.99 x10E6/uL (ref 3.77–5.28)
RDW: 12.6 % (ref 11.7–15.4)
WBC: 3.8 10*3/uL (ref 3.4–10.8)

## 2020-10-17 ENCOUNTER — Other Ambulatory Visit: Payer: Self-pay | Admitting: Family Medicine

## 2020-10-17 DIAGNOSIS — E785 Hyperlipidemia, unspecified: Secondary | ICD-10-CM

## 2020-10-17 MED ORDER — LOVASTATIN 20 MG PO TABS: 20.0000 mg | ORAL_TABLET | Freq: Every evening | ORAL | 3 refills | Status: DC

## 2020-10-18 ENCOUNTER — Ambulatory Visit: Payer: Medicare Other | Admitting: Gastroenterology

## 2020-10-28 ENCOUNTER — Other Ambulatory Visit: Payer: Self-pay | Admitting: Physician Assistant

## 2020-10-28 DIAGNOSIS — I10 Essential (primary) hypertension: Secondary | ICD-10-CM

## 2021-01-01 ENCOUNTER — Other Ambulatory Visit: Payer: Self-pay | Admitting: Family Medicine

## 2021-01-01 DIAGNOSIS — N1831 Chronic kidney disease, stage 3a: Secondary | ICD-10-CM

## 2021-01-01 DIAGNOSIS — I10 Essential (primary) hypertension: Secondary | ICD-10-CM

## 2021-01-01 DIAGNOSIS — K219 Gastro-esophageal reflux disease without esophagitis: Secondary | ICD-10-CM

## 2021-01-16 ENCOUNTER — Other Ambulatory Visit: Payer: Self-pay | Admitting: Family Medicine

## 2021-01-16 DIAGNOSIS — E785 Hyperlipidemia, unspecified: Secondary | ICD-10-CM

## 2021-01-23 ENCOUNTER — Ambulatory Visit: Payer: Medicare Other | Admitting: Family Medicine

## 2021-02-23 ENCOUNTER — Ambulatory Visit (INDEPENDENT_AMBULATORY_CARE_PROVIDER_SITE_OTHER): Payer: Medicare Other | Admitting: Dermatology

## 2021-02-23 ENCOUNTER — Other Ambulatory Visit: Payer: Self-pay

## 2021-02-23 DIAGNOSIS — L820 Inflamed seborrheic keratosis: Secondary | ICD-10-CM | POA: Diagnosis not present

## 2021-02-23 DIAGNOSIS — D1800 Hemangioma unspecified site: Secondary | ICD-10-CM | POA: Diagnosis not present

## 2021-02-23 DIAGNOSIS — L570 Actinic keratosis: Secondary | ICD-10-CM

## 2021-02-23 DIAGNOSIS — Z872 Personal history of diseases of the skin and subcutaneous tissue: Secondary | ICD-10-CM

## 2021-02-23 DIAGNOSIS — L821 Other seborrheic keratosis: Secondary | ICD-10-CM

## 2021-02-23 DIAGNOSIS — D229 Melanocytic nevi, unspecified: Secondary | ICD-10-CM

## 2021-02-23 DIAGNOSIS — L814 Other melanin hyperpigmentation: Secondary | ICD-10-CM

## 2021-02-23 DIAGNOSIS — Z1283 Encounter for screening for malignant neoplasm of skin: Secondary | ICD-10-CM

## 2021-02-23 DIAGNOSIS — L578 Other skin changes due to chronic exposure to nonionizing radiation: Secondary | ICD-10-CM | POA: Diagnosis not present

## 2021-02-23 NOTE — Progress Notes (Signed)
Follow-Up Visit   Subjective  Abigail Acosta is a 81 y.o. female who presents for the following: Annual Exam (Mole check ). Yearly mole check hx of Aks.  The patient presents for Total-Body Skin Exam (TBSE) for skin cancer screening and mole check.  The patient has spots, moles and lesions to be evaluated, some may be new or changing and the patient has concerns that these could be cancer.   The following portions of the chart were reviewed this encounter and updated as appropriate:   Tobacco   Allergies   Meds   Problems   Med Hx   Surg Hx   Fam Hx      Review of Systems:  No other skin or systemic complaints except as noted in HPI or Assessment and Plan.  Objective  Well appearing patient in no apparent distress; mood and affect are within normal limits.  A full examination was performed including scalp, head, eyes, ears, nose, lips, neck, chest, axillae, abdomen, back, buttocks, bilateral upper extremities, bilateral lower extremities, hands, feet, fingers, toes, fingernails, and toenails. All findings within normal limits unless otherwise noted below.  face, arm, back (4) (4) Stuck-on, waxy, tan-brown papules   chest x 4 (4) Erythematous thin papules/macules with gritty scale.   nasal bridge, left lateral forehead, left infraorbitial, right anterior neck (4) Stuck-on, waxy, tan-brown papules   Assessment & Plan  Inflamed seborrheic keratosis (4) face, arm, back (4)  Reassured benign age-related growth.  Recommend observation.  Discussed cryotherapy if spot(s) become irritated or inflamed.   Destruction of lesion - face, arm, back (4) Complexity: simple   Destruction method: cryotherapy   Informed consent: discussed and consent obtained   Timeout:  patient name, date of birth, surgical site, and procedure verified Lesion destroyed using liquid nitrogen: Yes   Region frozen until ice ball extended beyond lesion: Yes   Outcome: patient tolerated procedure well with no  complications   Post-procedure details: wound care instructions given    AK (actinic keratosis) chest x 4  Actinic keratoses are precancerous spots that appear secondary to cumulative UV radiation exposure/sun exposure over time. They are chronic with expected duration over 1 year. A portion of actinic keratoses will progress to squamous cell carcinoma of the skin. It is not possible to reliably predict which spots will progress to skin cancer and so treatment is recommended to prevent development of skin cancer.  Recommend daily broad spectrum sunscreen SPF 30+ to sun-exposed areas, reapply every 2 hours as needed.  Recommend staying in the shade or wearing long sleeves, sun glasses (UVA+UVB protection) and wide brim hats (4-inch brim around the entire circumference of the hat). Call for new or changing lesions.   Destruction of lesion - chest x 4 Complexity: simple   Destruction method: cryotherapy   Informed consent: discussed and consent obtained   Timeout:  patient name, date of birth, surgical site, and procedure verified Lesion destroyed using liquid nitrogen: Yes   Region frozen until ice ball extended beyond lesion: Yes   Outcome: patient tolerated procedure well with no complications   Post-procedure details: wound care instructions given    Seborrheic keratosis (4) - cosmetic / self-pay nasal bridge, left lateral forehead, left infraorbitial, right anterior neck  Reassured benign age-related growth.  Recommend observation.  Discussed cryotherapy if spot(s) become irritated or inflamed.   Pt will pay out of pocket to remove cosmetic Sks  Consent signed   Destruction of lesion - nasal bridge, left lateral forehead,  left infraorbitial, right anterior neck Complexity: simple   Destruction method: cryotherapy   Informed consent: discussed and consent obtained   Timeout:  patient name, date of birth, surgical site, and procedure verified Lesion destroyed using liquid  nitrogen: Yes   Region frozen until ice ball extended beyond lesion: Yes   Outcome: patient tolerated procedure well with no complications   Post-procedure details: wound care instructions given    Skin cancer screening  Lentigines - Scattered tan macules - Due to sun exposure - Benign-appearing, observe - Recommend daily broad spectrum sunscreen SPF 30+ to sun-exposed areas, reapply every 2 hours as needed. - Call for any changes  Seborrheic Keratoses Discussed laser removal to remove benign cosmetic area  - Stuck-on, waxy, tan-brown papules and/or plaques  - Benign-appearing - Discussed benign etiology and prognosis. - Observe - Call for any changes  Melanocytic Nevi - Tan-brown and/or pink-flesh-colored symmetric macules and papules - Benign appearing on exam today - Observation - Call clinic for new or changing moles - Recommend daily use of broad spectrum spf 30+ sunscreen to sun-exposed areas.   Hemangiomas - Red papules - Discussed benign nature - Observe - Call for any changes  Actinic Damage - Chronic condition, secondary to cumulative UV/sun exposure - diffuse scaly erythematous macules with underlying dyspigmentation - Recommend daily broad spectrum sunscreen SPF 30+ to sun-exposed areas, reapply every 2 hours as needed.  - Staying in the shade or wearing long sleeves, sun glasses (UVA+UVB protection) and wide brim hats (4-inch brim around the entire circumference of the hat) are also recommended for sun protection.  - Call for new or changing lesions.  Skin cancer screening performed today.   Return in about 3 months (around 05/23/2021) for sks .  IMarye Round, CMA, am acting as scribe for Sarina Ser, MD .  Documentation: I have reviewed the above documentation for accuracy and completeness, and I agree with the above.  Sarina Ser, MD

## 2021-02-23 NOTE — Patient Instructions (Addendum)
Prior to procedure, discussed risks of blister formation, small wound, skin dyspigmentation, or rare scar following cryotherapy. Recommend Vaseline ointment to treated areas while healing.     If You Need Anything After Your Visit  If you have any questions or concerns for your doctor, please call our main line at [phone] and press option 4 to reach your doctor's medical assistant. If no one answers, please leave a voicemail as directed and we will return your call as soon as possible. Messages left after 4 pm will be answered the following business day.   You may also send us a message via MyChart. We typically respond to MyChart messages within 1-2 business days.  For prescription refills, please ask your pharmacy to contact our office. Our fax number is [phone].  If you have an urgent issue when the clinic is closed that cannot wait until the next business day, you can page your doctor at the number below.    Please note that while we do our best to be available for urgent issues outside of office hours, we are not available 24/7.   If you have an urgent issue and are unable to reach us, you may choose to seek medical care at your doctor's office, retail clinic, urgent care center, or emergency room.  If you have a medical emergency, please immediately call 911 or go to the emergency department.  Pager Numbers  - Dr. Kowalski: [phone]  - Dr. Moye: [phone]  - Dr. Stewart: [phone]  In the event of inclement weather, please call our main line at [phone] for an update on the status of any delays or closures.  Dermatology Medication Tips: Please keep the boxes that topical medications come in in order to help keep track of the instructions about where and how to use these. Pharmacies typically print the medication instructions only on the boxes and not directly on the medication tubes.   If your medication is too expensive, please contact our office at  [phone] option 4 or send us a message through MyChart.   We are unable to tell what your co-pay for medications will be in advance as this is different depending on your insurance coverage. However, we may be able to find a substitute medication at lower cost or fill out paperwork to get insurance to cover a needed medication.   If a prior authorization is required to get your medication covered by your insurance company, please allow us 1-2 business days to complete this process.  Drug prices often vary depending on where the prescription is filled and some pharmacies may offer cheaper prices.  The website www.goodrx.com contains coupons for medications through different pharmacies. The prices here do not account for what the cost may be with help from insurance (it may be cheaper with your insurance), but the website can give you the price if you did not use any insurance.  - You can print the associated coupon and take it with your prescription to the pharmacy.  - You may also stop by our office during regular business hours and pick up a GoodRx coupon card.  - If you need your prescription sent electronically to a different pharmacy, notify our office through [place] MyChart or by phone at [phone] option 4.     Si Usted Necesita Algo Despus de Su Visita  Tambin puede enviarnos un mensaje a travs de MyChart. Por lo general respondemos a los mensajes de MyChart en el transcurso de 1 a 2 das   hbiles.  Para renovar recetas, por favor pida a su farmacia que se ponga en contacto con nuestra oficina. Nuestro nmero de fax es el [phone].  Si tiene un asunto urgente cuando la clnica est cerrada y que no puede esperar hasta el siguiente da hbil, puede llamar/localizar a su doctor(a) al nmero que aparece a continuacin.   Por favor, tenga en cuenta que aunque hacemos todo lo posible para estar disponibles para asuntos urgentes fuera del horario de oficina, no estamos  disponibles las 24 horas del da, los 7 das de la semana.   Si tiene un problema urgente y no puede comunicarse con nosotros, puede optar por buscar atencin mdica  en el consultorio de su doctor(a), en una clnica privada, en un centro de atencin urgente o en una sala de emergencias.  Si tiene una emergencia mdica, por favor llame inmediatamente al 911 o vaya a la sala de emergencias.  Nmeros de bper  - Dr. Kowalski: [phone]  - Dra. Moye: [phone]  - Dra. Stewart: [phone]  En caso de inclemencias del tiempo, por favor llame a nuestra lnea principal al [phone] para una actualizacin sobre el estado de cualquier retraso o cierre.  Consejos para la medicacin en dermatologa: Por favor, guarde las cajas en las que vienen los medicamentos de uso tpico para ayudarle a seguir las instrucciones sobre dnde y cmo usarlos. Las farmacias generalmente imprimen las instrucciones del medicamento slo en las cajas y no directamente en los tubos del medicamento.   Si su medicamento es muy caro, por favor, pngase en contacto con nuestra oficina llamando al [phone] y presione la opcin 4 o envenos un mensaje a travs de MyChart.   No podemos decirle cul ser su copago por los medicamentos por adelantado ya que esto es diferente dependiendo de la cobertura de su seguro. Sin embargo, es posible que podamos encontrar un medicamento sustituto a menor costo o llenar un formulario para que el seguro cubra el medicamento que se considera necesario.   Si se requiere una autorizacin previa para que su compaa de seguros cubra su medicamento, por favor permtanos de 1 a 2 das hbiles para completar este proceso.  Los precios de los medicamentos varan con frecuencia dependiendo del lugar de dnde se surte la receta y alguna farmacias pueden ofrecer precios ms baratos.  El sitio web www.goodrx.com tiene cupones para medicamentos de diferentes farmacias. Los precios aqu no  tienen en cuenta lo que podra costar con la ayuda del seguro (puede ser ms barato con su seguro), pero el sitio web puede darle el precio si no utiliz ningn seguro.  - Puede imprimir el cupn correspondiente y llevarlo con su receta a la farmacia.  - Tambin puede pasar por nuestra oficina durante el horario de atencin regular y recoger una tarjeta de cupones de GoodRx.  - Si necesita que su receta se enve electrnicamente a una farmacia diferente, informe a nuestra oficina a travs de [facility]Chart [facility] o por telfono llamando al [phone] y presione la opcin 4.  

## 2021-02-28 ENCOUNTER — Encounter: Payer: Self-pay | Admitting: Dermatology

## 2021-03-02 ENCOUNTER — Ambulatory Visit: Payer: Medicare Other | Admitting: Family Medicine

## 2021-03-02 NOTE — Progress Notes (Deleted)
Acute Office Visit  Subjective:    Patient ID: Abigail Acosta, female    DOB: [birth year], 81 y.o.   MRN: [id number]  No chief complaint on file.   HPI Patient is in today for evaluation of possible UTI.  Past Medical History:  Diagnosis Date   Allergy    Cataract    Cataract    right eye-not mature   Complication of anesthesia    Hypertension    PONV (postoperative nausea and vomiting)    Varicose veins     Past Surgical History:  Procedure Laterality Date   ABDOMINAL HYSTERECTOMY     ANTERIOR AND POSTERIOR REPAIR N/A 04/28/2013   Procedure: REPAIR CYSTOCELE AND RECTOCELE;  Surgeon: Reece Packer, MD;  Location: [facility];  Service: Urology;  Laterality: N/A;   APPENDECTOMY     BREAST BIOPSY Left 12/22/2018   Needle Localization   BREAST BIOPSY Left 12/22/2018   Procedure: BREAST BIOPSY WITH NEEDLE LOCALIZATION;  Surgeon: Ronny Bacon, MD;  Location: [facility];  Service: General;  Laterality: Left;   CATARACT EXTRACTION Left    CYSTOSCOPY N/A 04/28/2013   Procedure: CYSTOSCOPY;  Surgeon: Reece Packer, MD;  Location: [facility];  Service: Urology;  Laterality: N/A;   HERNIA REPAIR Bilateral    inguinal   VAGINAL PROLAPSE REPAIR N/A 04/28/2013   Procedure: VAULT PROLAPSE AND GRAFT  ;  Surgeon: Reece Packer, MD;  Location: [facility];  Service: Urology;  Laterality: N/A;   VARICOSE VEIN SURGERY Right    stripping    Family History  Adopted: Yes  Problem Relation Age of Onset   Cancer Mother    Breast cancer Neg Hx     Social History   Socioeconomic History   Marital status: Married    Spouse name: Not on file   Number of children: 5   Years of education: Not on file   Highest education level: Some college, no degree  Occupational History   Occupation: retired  Tobacco Use   Smoking status: Never   Smokeless tobacco: Never  Vaping Use   Vaping Use: Never used  Substance and Sexual Activity   Alcohol use: No   Drug use: No   Sexual activity:  Not Currently  Other Topics Concern   Not on file  Social History Narrative   Not on file   Social Determinants of Health   Financial Resource Strain: Not on file  Food Insecurity: Not on file  Transportation Needs: Not on file  Physical Activity: Not on file  Stress: Not on file  Social Connections: Not on file  Intimate Partner Violence: Not on file    Outpatient Medications Prior to Visit  Medication Sig Dispense Refill   lisinopril-hydrochlorothiazide (ZESTORETIC) 10-12.5 MG tablet Take 0.5 tablets by mouth daily. 45 tablet 1   acetaminophen (TYLENOL) 500 MG tablet Take 500-1,000 mg by mouth 2 (two) times daily as needed (back pain.).     amLODipine (NORVASC) 2.5 MG tablet TAKE 1 TABLET BY MOUTH EVERY EVENING. 90 tablet 4   b complex vitamins capsule Take 1 capsule by mouth daily.     Biotin 1000 MCG tablet Take 1,000 mcg by mouth daily with lunch.     cyanocobalamin 1000 MCG tablet Take 1,000 mcg by mouth daily.     loratadine (CLARITIN) 10 MG tablet Take 1 tablet (10 mg total) by mouth daily. 90 tablet 2   lovastatin (MEVACOR) 20 MG tablet TAKE 1 TABLET BY MOUTH EVERY DAY IN THE  EVENING 90 tablet 4   montelukast (SINGULAIR) 10 MG tablet Take 1 tablet (10 mg total) by mouth at bedtime. 90 tablet 2   Multiple Vitamin (MULTIVITAMIN WITH MINERALS) TABS tablet Take 1 tablet by mouth daily with lunch. Women's Multivitamin     omeprazole (PRILOSEC) 20 MG capsule TAKE 1 CAPSULE BY MOUTH EVERY DAY 90 capsule 4   No facility-administered medications prior to visit.    No Known Allergies  Review of Systems     Objective:    Physical Exam  There were no vitals taken for this visit. Wt Readings from Last 3 Encounters:  10/05/20 131 lb (59.4 kg)  07/01/20 131 lb 3.2 oz (59.5 kg)  10/09/19 129 lb 6.4 oz (58.7 kg)    Health Maintenance Due  Topic Date Due   TETANUS/TDAP  Never done   Zoster Vaccines- Shingrix (1 of 2) Never done   Pneumonia Vaccine 64+ Years old (1 - PCV)  Never done   COVID-19 Vaccine (3 - Pfizer risk series) 03/25/2019   COLONOSCOPY (Pts 45-55yr Insurance coverage will need to be confirmed)  05/30/2020    There are no preventive care reminders to display for this patient.   No results found for: TSH Lab Results  Component Value Date   WBC 3.8 10/05/2020   HGB 15.1 10/05/2020   HCT 45.3 10/05/2020   MCV 91 10/05/2020   PLT 182 10/05/2020   Lab Results  Component Value Date   NA 141 10/05/2020   K 4.3 10/05/2020   CO2 24 10/05/2020   GLUCOSE 93 10/05/2020   BUN 25 10/05/2020   CREATININE 1.18 (H) 10/05/2020   BILITOT 0.5 10/05/2020   ALKPHOS 74 10/05/2020   AST 38 10/05/2020   ALT 27 10/05/2020   PROT 7.4 10/05/2020   ALBUMIN 5.0 (H) 10/05/2020   CALCIUM 10.0 10/05/2020   ANIONGAP 9 12/18/2018   EGFR 47 (L) 10/05/2020   Lab Results  Component Value Date   CHOL 266 (H) 10/05/2020   Lab Results  Component Value Date   HDL 73 10/05/2020   Lab Results  Component Value Date   LDLCALC 182 (H) 10/05/2020   Lab Results  Component Value Date   TRIG 69 10/05/2020   Lab Results  Component Value Date   CHOLHDL 3.6 10/05/2020   No results found for: HGBA1C     Assessment & Plan:   Problem List Items Addressed This Visit   None    No orders of the defined types were placed in this encounter.   IArgentina PonderDeSanto,acting as a scribe for EGwyneth Sprout FNP.,have documented all relevant documentation on the behalf of EGwyneth Sprout FNP,as directed by  EGwyneth Sprout FNP while in the presence of EGwyneth Sprout FNP.

## 2021-03-07 NOTE — Progress Notes (Unsigned)
Acute Office Visit  Subjective:    Patient ID: Abigail Acosta, female    DOB: January 01, 1941, 81 y.o.   MRN: [id number]  No chief complaint on file.   HPI Patient is in today for evaluation of possible UTI  Past Medical History:  Diagnosis Date   Allergy    Cataract    Cataract    right eye-not mature   Complication of anesthesia    Hypertension    PONV (postoperative nausea and vomiting)    Varicose veins     Past Surgical History:  Procedure Laterality Date   ABDOMINAL HYSTERECTOMY     ANTERIOR AND POSTERIOR REPAIR N/A 04/28/2013   Procedure: REPAIR CYSTOCELE AND RECTOCELE;  Surgeon: Reece Packer, MD;  Location: [facility];  Service: Urology;  Laterality: N/A;   APPENDECTOMY     BREAST BIOPSY Left 12/22/2018   Needle Localization   BREAST BIOPSY Left 12/22/2018   Procedure: BREAST BIOPSY WITH NEEDLE LOCALIZATION;  Surgeon: Ronny Bacon, MD;  Location: [facility];  Service: General;  Laterality: Left;   CATARACT EXTRACTION Left    CYSTOSCOPY N/A 04/28/2013   Procedure: CYSTOSCOPY;  Surgeon: Reece Packer, MD;  Location: [facility];  Service: Urology;  Laterality: N/A;   HERNIA REPAIR Bilateral    inguinal   VAGINAL PROLAPSE REPAIR N/A 04/28/2013   Procedure: VAULT PROLAPSE AND GRAFT  ;  Surgeon: Reece Packer, MD;  Location: [facility];  Service: Urology;  Laterality: N/A;   VARICOSE VEIN SURGERY Right    stripping    Family History  Adopted: Yes  Problem Relation Age of Onset   Cancer Mother    Breast cancer Neg Hx     Social History   Socioeconomic History   Marital status: Married    Spouse name: Not on file   Number of children: 5   Years of education: Not on file   Highest education level: Some college, no degree  Occupational History   Occupation: retired  Tobacco Use   Smoking status: Never   Smokeless tobacco: Never  Vaping Use   Vaping Use: Never used  Substance and Sexual Activity   Alcohol use: No   Drug use: No   Sexual activity: Not  Currently  Other Topics Concern   Not on file  Social History Narrative   Not on file   Social Determinants of Health   Financial Resource Strain: Not on file  Food Insecurity: Not on file  Transportation Needs: Not on file  Physical Activity: Not on file  Stress: Not on file  Social Connections: Not on file  Intimate Partner Violence: Not on file    Outpatient Medications Prior to Visit  Medication Sig Dispense Refill   lisinopril-hydrochlorothiazide (ZESTORETIC) 10-12.5 MG tablet Take 0.5 tablets by mouth daily. 45 tablet 1   acetaminophen (TYLENOL) 500 MG tablet Take 500-1,000 mg by mouth 2 (two) times daily as needed (back pain.).     amLODipine (NORVASC) 2.5 MG tablet TAKE 1 TABLET BY MOUTH EVERY EVENING. 90 tablet 4   b complex vitamins capsule Take 1 capsule by mouth daily.     Biotin 1000 MCG tablet Take 1,000 mcg by mouth daily with lunch.     cyanocobalamin 1000 MCG tablet Take 1,000 mcg by mouth daily.     loratadine (CLARITIN) 10 MG tablet Take 1 tablet (10 mg total) by mouth daily. 90 tablet 2   lovastatin (MEVACOR) 20 MG tablet TAKE 1 TABLET BY MOUTH EVERY DAY IN THE  EVENING 90 tablet 4   montelukast (SINGULAIR) 10 MG tablet Take 1 tablet (10 mg total) by mouth at bedtime. 90 tablet 2   Multiple Vitamin (MULTIVITAMIN WITH MINERALS) TABS tablet Take 1 tablet by mouth daily with lunch. Women's Multivitamin     omeprazole (PRILOSEC) 20 MG capsule TAKE 1 CAPSULE BY MOUTH EVERY DAY 90 capsule 4   No facility-administered medications prior to visit.    No Known Allergies  Review of Systems     Objective:    Physical Exam  There were no vitals taken for this visit. Wt Readings from Last 3 Encounters:  10/05/20 131 lb (59.4 kg)  07/01/20 131 lb 3.2 oz (59.5 kg)  10/09/19 129 lb 6.4 oz (58.7 kg)    Health Maintenance Due  Topic Date Due   TETANUS/TDAP  Never done   Zoster Vaccines- Shingrix (1 of 2) Never done   Pneumonia Vaccine 62+ Years old (1 - PCV)  Never done   COVID-19 Vaccine (3 - Pfizer risk series) 03/25/2019   COLONOSCOPY (Pts 45-19yr Insurance coverage will need to be confirmed)  05/30/2020    There are no preventive care reminders to display for this patient.   No results found for: TSH Lab Results  Component Value Date   WBC 3.8 10/05/2020   HGB 15.1 10/05/2020   HCT 45.3 10/05/2020   MCV 91 10/05/2020   PLT 182 10/05/2020   Lab Results  Component Value Date   NA 141 10/05/2020   K 4.3 10/05/2020   CO2 24 10/05/2020   GLUCOSE 93 10/05/2020   BUN 25 10/05/2020   CREATININE 1.18 (H) 10/05/2020   BILITOT 0.5 10/05/2020   ALKPHOS 74 10/05/2020   AST 38 10/05/2020   ALT 27 10/05/2020   PROT 7.4 10/05/2020   ALBUMIN 5.0 (H) 10/05/2020   CALCIUM 10.0 10/05/2020   ANIONGAP 9 12/18/2018   EGFR 47 (L) 10/05/2020   Lab Results  Component Value Date   CHOL 266 (H) 10/05/2020   Lab Results  Component Value Date   HDL 73 10/05/2020   Lab Results  Component Value Date   LDLCALC 182 (H) 10/05/2020   Lab Results  Component Value Date   TRIG 69 10/05/2020   Lab Results  Component Value Date   CHOLHDL 3.6 10/05/2020   No results found for: HGBA1C     Assessment & Plan:   Problem List Items Addressed This Visit   None     I,Maizey Menendez D Allister Lessley,acting as a scribe for EGwyneth Sprout FNP.,have documented all relevant documentation on the behalf of EGwyneth Sprout FNP,as directed by  EGwyneth Sprout FNP while in the presence of EGwyneth Sprout FNP.

## 2021-03-08 ENCOUNTER — Ambulatory Visit: Payer: Medicare Other | Admitting: Family Medicine

## 2021-04-10 NOTE — Progress Notes (Signed)
? ? ?I,[name],acting as a Education administrator for [employer], [name].,have documented all relevant documentation on the behalf of Mikey Kirschner, [name],as directed by  Mikey Kirschner, [name] while in the presence of Mikey Kirschner, [name]. ? ? ?Annual Wellness Visit ? ?  ? ?Patient: Abigail Acosta, Female    DOB: 02/01/1940, 81 y.o.   MRN: [id number] ?Visit Date: 04/11/2021 ? ?Today's Provider: Mikey Kirschner, [name]  ? ?Chief Complaint  ?Patient presents with  ? Annual Exam  ? ?Subjective  ?  ?Abigail Acosta is a 81 y.o. female who presents today for her Annual Wellness Visit. ?She reports consuming a general and no salts at all  diet.  Reports walking her dog everyday for at least 45 minutes unless it is raining.   She generally feels well. She reports sleeping well. She does not have additional problems to discuss today.  ? ?Medications: ?Outpatient Medications Prior to Visit  ?Medication Sig  ? acetaminophen (TYLENOL) 500 MG tablet Take 500-1,000 mg by mouth 2 (two) times daily as needed (back pain.).  ? amLODipine (NORVASC) 2.5 MG tablet TAKE 1 TABLET BY MOUTH EVERY EVENING.  ? b complex vitamins capsule Take 1 capsule by mouth daily.  ? Biotin 1000 MCG tablet Take 1,000 mcg by mouth daily with lunch.  ? cyanocobalamin 1000 MCG tablet Take 1,000 mcg by mouth daily.  ? lisinopril-hydrochlorothiazide (ZESTORETIC) 10-12.5 MG tablet Take 0.5 tablets by mouth daily.  ? loratadine (CLARITIN) 10 MG tablet Take 1 tablet (10 mg total) by mouth daily.  ? lovastatin (MEVACOR) 20 MG tablet TAKE 1 TABLET BY MOUTH EVERY DAY IN THE EVENING  ? montelukast (SINGULAIR) 10 MG tablet Take 1 tablet (10 mg total) by mouth at bedtime.  ? Multiple Vitamin (MULTIVITAMIN WITH MINERALS) TABS tablet Take 1 tablet by mouth daily with lunch. Women's Multivitamin  ? omeprazole (PRILOSEC) 20 MG capsule TAKE 1 CAPSULE BY MOUTH EVERY DAY  ? ?No facility-administered medications prior to visit.  ?  ?No Known Allergies ? ?Patient Care Team: ?Emelia Loron as PCP - General (Physician Assistant) ?Pa, [facility]) ? ?Review of Systems  ?Constitutional: Negative.  Negative for fatigue and fever.  ?HENT:  Positive for postnasal drip.   ?Eyes:  Positive for photophobia.  ?Respiratory:  Negative for cough and shortness of breath.   ?Cardiovascular: Negative.  Negative for chest pain and leg swelling.  ?Gastrointestinal: Negative.  Negative for abdominal pain.  ?Endocrine: Negative.   ?Genitourinary: Negative.   ?Musculoskeletal:  Positive for back pain.  ?Skin: Negative.   ?Allergic/Immunologic: Positive for environmental allergies.  ?Neurological: Negative.  Negative for dizziness and headaches.  ?Hematological: Negative.   ?Psychiatric/Behavioral: Negative.    ?All other systems reviewed and are negative. ? ?Last CBC ?Lab Results  ?Component Value Date  ? WBC 3.8 10/05/2020  ? HGB 15.1 10/05/2020  ? HCT 45.3 10/05/2020  ? MCV 91 10/05/2020  ? MCH 30.3 10/05/2020  ? RDW 12.6 10/05/2020  ? PLT 182 10/05/2020  ? ?Last metabolic panel ?Lab Results  ?Component Value Date  ? GLUCOSE 93 10/05/2020  ? NA 141 10/05/2020  ? K 4.3 10/05/2020  ? CL 103 10/05/2020  ? CO2 24 10/05/2020  ? BUN 25 10/05/2020  ? CREATININE 1.18 (H) 10/05/2020  ? EGFR 47 (L) 10/05/2020  ? CALCIUM 10.0 10/05/2020  ? PHOS 3.4 07/25/2018  ? PROT 7.4 10/05/2020  ? ALBUMIN 5.0 (H) 10/05/2020  ? LABGLOB 2.4 10/05/2020  ? AGRATIO 2.1 10/05/2020  ? BILITOT 0.5  10/05/2020  ? ALKPHOS 74 10/05/2020  ? AST 38 10/05/2020  ? ALT 27 10/05/2020  ? ANIONGAP 9 12/18/2018  ? ?Last lipids ?Lab Results  ?Component Value Date  ? CHOL 266 (H) 10/05/2020  ? HDL 73 10/05/2020  ? LDLCALC 182 (H) 10/05/2020  ? TRIG 69 10/05/2020  ? CHOLHDL 3.6 10/05/2020  ? ?Last hemoglobin A1c ?No results found for: HGBA1C ?Last thyroid functions ?No results found for: TSH, T3TOTAL, T4TOTAL, THYROIDAB ?Last vitamin D ?No results found for: 25OHVITD2, [place], VD25OH ?Last vitamin B12 and Folate ?No results found  for: VITAMINB12, FOLATE ?  ?  ? Objective  ?  ?Vitals: BP (!) 148/60 (BP Location: Left Arm, Patient Position: Sitting, Cuff Size: Normal)   Pulse 62   Ht 5' 1.5" (1.562 m)   Wt 132 lb 14.4 oz (60.3 kg)   SpO2 100%   BMI 24.70 kg/m?  ?BP Readings from Last 3 Encounters:  ?04/11/21 (!) 148/60  ?10/05/20 (!) 199/74  ?07/01/20 (!) 157/81  ? ?Wt Readings from Last 3 Encounters:  ?04/11/21 132 lb 14.4 oz (60.3 kg)  ?10/05/20 131 lb (59.4 kg)  ?07/01/20 131 lb 3.2 oz (59.5 kg)  ? ?  ? ? ?Physical Exam ?Constitutional:   ?   General: She is awake.  ?   Appearance: She is well-developed.  ?HENT:  ?   Head: Normocephalic.  ?   Right Ear: Tympanic membrane normal.  ?   Left Ear: Tympanic membrane normal.  ?Eyes:  ?   Conjunctiva/sclera: Conjunctivae normal.  ?   Pupils: Pupils are equal, round, and reactive to light.  ?Neck:  ?   Thyroid: No thyroid mass or thyromegaly.  ?Cardiovascular:  ?   Rate and Rhythm: Normal rate and regular rhythm.  ?   Heart sounds: Normal heart sounds.  ?Pulmonary:  ?   Effort: Pulmonary effort is normal.  ?   Breath sounds: Normal breath sounds.  ?Abdominal:  ?   Palpations: Abdomen is soft.  ?   Tenderness: There is no abdominal tenderness.  ?Musculoskeletal:  ?   Right lower leg: No swelling. No edema.  ?   Left lower leg: No swelling. No edema.  ?Lymphadenopathy:  ?   Cervical: No cervical adenopathy.  ?Skin: ?   General: Skin is warm.  ?Neurological:  ?   Mental Status: She is alert and oriented to person, place, and time.  ?Psychiatric:     ?   Attention and Perception: Attention normal.     ?   Mood and Affect: Mood normal.     ?   Speech: Speech normal.     ?   Behavior: Behavior is cooperative.  ? ?Most recent functional status assessment: ? ?  04/11/2021  ?  1:27 PM  ?In your present state of health, do you have any difficulty performing the following activities:  ?Hearing? 1  ?Vision? 0  ?Difficulty concentrating or making decisions? 0  ?Walking or climbing stairs? 0  ?Dressing or  bathing? 0  ?Doing errands, shopping? 0  ? ?Most recent fall risk assessment: ? ?  04/11/2021  ?  1:26 PM  ?Fall Risk   ?Falls in the past year? 0  ?Number falls in past yr: 0  ?Injury with Fall? 0  ?Risk for fall due to : No Fall Risks  ? ? Most recent depression screenings: ? ?  04/11/2021  ?  1:26 PM 07/01/2020  ?  8:15 AM  ?PHQ 2/9 Scores  ?PHQ - 2  Score 0 0  ?PHQ- 9 Score 0 0  ? ?Most recent Audit-C alcohol use screening ? ?  04/11/2021  ?  1:26 PM  ?Alcohol Use Disorder Test (AUDIT)  ?1. How often do you have a drink containing alcohol? 0  ?2. How many drinks containing alcohol do you have on a typical day when you are drinking? 0  ?3. How often do you have six or more drinks on one occasion? 0  ?AUDIT-C Score 0  ? ?A score of 3 or more in women, and 4 or more in men indicates increased risk for alcohol abuse, EXCEPT if all of the points are from question 1  ? ?No results found for any visits on 04/11/21. ? Assessment & Plan  ?  ? ?Annual wellness visit done today including the all of the following: ?Reviewed patient's Family Medical History ?Reviewed and updated list of patient's medical providers ?Assessment of cognitive impairment was done ?Assessed patient's functional ability ?Established a written schedule for health screening services ?Health Risk Assessent Completed and Reviewed ? ?Exercise Activities and Dietary recommendations ?--balanced diet high in fiber and protein, low in sugars, carbs, fats. ?--physical activity/exercise 30 minutes 3-5 times a week  ?Immunization History  ?Administered Date(s) Administered  ? Fluad Quad(high Dose 65+) 10/09/2019  ? PFIZER(Purple Top)SARS-COV-2 Vaccination 02/04/2019, 02/25/2019  ? ? ?Health Maintenance  ?Topic Date Due  ? TETANUS/TDAP  Never done  ? Zoster Vaccines- Shingrix (1 of 2) Never done  ? Pneumonia Vaccine 50+ Years old (1 - PCV) Never done  ? INFLUENZA VACCINE  04/14/2021 (Originally 08/15/2020)  ? COVID-19 Vaccine (3 - Pfizer risk series) 04/27/2021  (Originally 03/25/2019)  ? COLONOSCOPY (Pts 45-53yr Insurance coverage will need to be confirmed)  04/12/2022 (Originally 05/30/2020)  ? DEXA SCAN  11/16/2022  ? Hepatitis C Screening  Completed  ? HPV VACCINES  Aged Out

## 2021-04-11 ENCOUNTER — Ambulatory Visit (INDEPENDENT_AMBULATORY_CARE_PROVIDER_SITE_OTHER): Payer: Medicare Other | Admitting: Physician Assistant

## 2021-04-11 ENCOUNTER — Encounter: Payer: Self-pay | Admitting: Physician Assistant

## 2021-04-11 ENCOUNTER — Other Ambulatory Visit: Payer: Self-pay

## 2021-04-11 VITALS — BP 148/60 | HR 62 | Ht 61.5 in | Wt 132.9 lb

## 2021-04-11 DIAGNOSIS — M85851 Other specified disorders of bone density and structure, right thigh: Secondary | ICD-10-CM

## 2021-04-11 DIAGNOSIS — I10 Essential (primary) hypertension: Secondary | ICD-10-CM

## 2021-04-11 DIAGNOSIS — Z1231 Encounter for screening mammogram for malignant neoplasm of breast: Secondary | ICD-10-CM

## 2021-04-11 DIAGNOSIS — N1831 Chronic kidney disease, stage 3a: Secondary | ICD-10-CM

## 2021-04-11 DIAGNOSIS — E7841 Elevated Lipoprotein(a): Secondary | ICD-10-CM | POA: Diagnosis not present

## 2021-04-11 DIAGNOSIS — K219 Gastro-esophageal reflux disease without esophagitis: Secondary | ICD-10-CM

## 2021-04-11 DIAGNOSIS — E7800 Pure hypercholesterolemia, unspecified: Secondary | ICD-10-CM | POA: Insufficient documentation

## 2021-04-11 DIAGNOSIS — M85852 Other specified disorders of bone density and structure, left thigh: Secondary | ICD-10-CM

## 2021-04-11 DIAGNOSIS — Z0000 Encounter for general adult medical examination without abnormal findings: Secondary | ICD-10-CM | POA: Diagnosis not present

## 2021-04-11 NOTE — Assessment & Plan Note (Signed)
Will repeat lipid panel ?Currently managed with lovastatin 20 ?The 10-year ASCVD risk score (Arnett DK, et al., 2019) is: 40.3% ? ?Advised what statins are for-- prevention of heart attack/ stroke. No SE on med, but pt would like to stop one day. ?

## 2021-04-11 NOTE — Assessment & Plan Note (Signed)
Described to pt what CKD means, will repeat CMP ?Avoid NSAIDS, increase fluids ?

## 2021-04-11 NOTE — Assessment & Plan Note (Signed)
Can repeat dexa, last was 2019.  ? ?

## 2021-04-11 NOTE — Assessment & Plan Note (Signed)
Manages with omeprazole 20 mg.  ? ?

## 2021-04-11 NOTE — Assessment & Plan Note (Signed)
Pt currently manages with: Amlodipine 2.5 mg at bedtime, and lisinopril/hctz 10/12.5 1/2 a pill if systolic is 861-683 in AM or a full pill if systolic is > 729 in AM.  ?She feels comfortable with this, home Bps and repeat in office in range for her age.  ? ?

## 2021-04-12 LAB — LIPID PANEL
Chol/HDL Ratio: 3.1 ratio (ref 0.0–4.4)
Cholesterol, Total: 221 mg/dL — ABNORMAL HIGH (ref 100–199)
HDL: 71 mg/dL (ref >39)
LDL Chol Calc (NIH): 129 mg/dL — ABNORMAL HIGH (ref 0–99)
Triglycerides: 119 mg/dL (ref 0–149)
VLDL Cholesterol Cal: 21 mg/dL (ref 5–40)

## 2021-04-12 LAB — COMPREHENSIVE METABOLIC PANEL
ALT: 30 IU/L (ref 0–32)
AST: 34 IU/L (ref 0–40)
Albumin/Globulin Ratio: 1.8 (ref 1.2–2.2)
Albumin: 4.9 g/dL — ABNORMAL HIGH (ref 3.7–4.7)
Alkaline Phosphatase: 57 IU/L (ref 44–121)
BUN/Creatinine Ratio: 24 (ref 12–28)
BUN: 42 mg/dL — ABNORMAL HIGH (ref 8–27)
Bilirubin Total: 0.5 mg/dL (ref 0.0–1.2)
CO2: 21 mmol/L (ref 20–29)
Calcium: 10.2 mg/dL (ref 8.7–10.3)
Chloride: 102 mmol/L (ref 96–106)
Creatinine, Ser: 1.73 mg/dL — ABNORMAL HIGH (ref 0.57–1.00)
Globulin, Total: 2.7 g/dL (ref 1.5–4.5)
Glucose: 102 mg/dL — ABNORMAL HIGH (ref 70–99)
Potassium: 4.3 mmol/L (ref 3.5–5.2)
Sodium: 138 mmol/L (ref 134–144)
Total Protein: 7.6 g/dL (ref 6.0–8.5)
eGFR: 30 mL/min/{1.73_m2} — ABNORMAL LOW (ref >59)

## 2021-04-12 LAB — TSH+FREE T4
Free T4: 1.02 ng/dL (ref 0.82–1.77)
TSH: 6.71 u[IU]/mL — ABNORMAL HIGH (ref 0.450–4.500)

## 2021-04-14 ENCOUNTER — Other Ambulatory Visit: Payer: Self-pay

## 2021-04-14 DIAGNOSIS — N289 Disorder of kidney and ureter, unspecified: Secondary | ICD-10-CM

## 2021-04-14 DIAGNOSIS — R7989 Other specified abnormal findings of blood chemistry: Secondary | ICD-10-CM

## 2021-05-08 DIAGNOSIS — M47816 Spondylosis without myelopathy or radiculopathy, lumbar region: Secondary | ICD-10-CM | POA: Insufficient documentation

## 2021-05-08 DIAGNOSIS — M546 Pain in thoracic spine: Secondary | ICD-10-CM | POA: Insufficient documentation

## 2021-05-25 ENCOUNTER — Ambulatory Visit: Payer: Medicare Other | Admitting: Dermatology

## 2021-06-07 IMAGING — MG DIGITAL DIAGNOSTIC UNILAT LEFT W/ CAD
3 series · 3 of 3 positions shown · non-contrast
Comparison: Screening mammogram dated 10/09/2018.

CLINICAL DATA: Patient was called back from screening mammogram for
left breast calcifications.

EXAM:
DIGITAL DIAGNOSTIC LEFT MAMMOGRAM WITH CAD
ULTRASOUND LEFT BREAST

[L ML (1 of 2)]
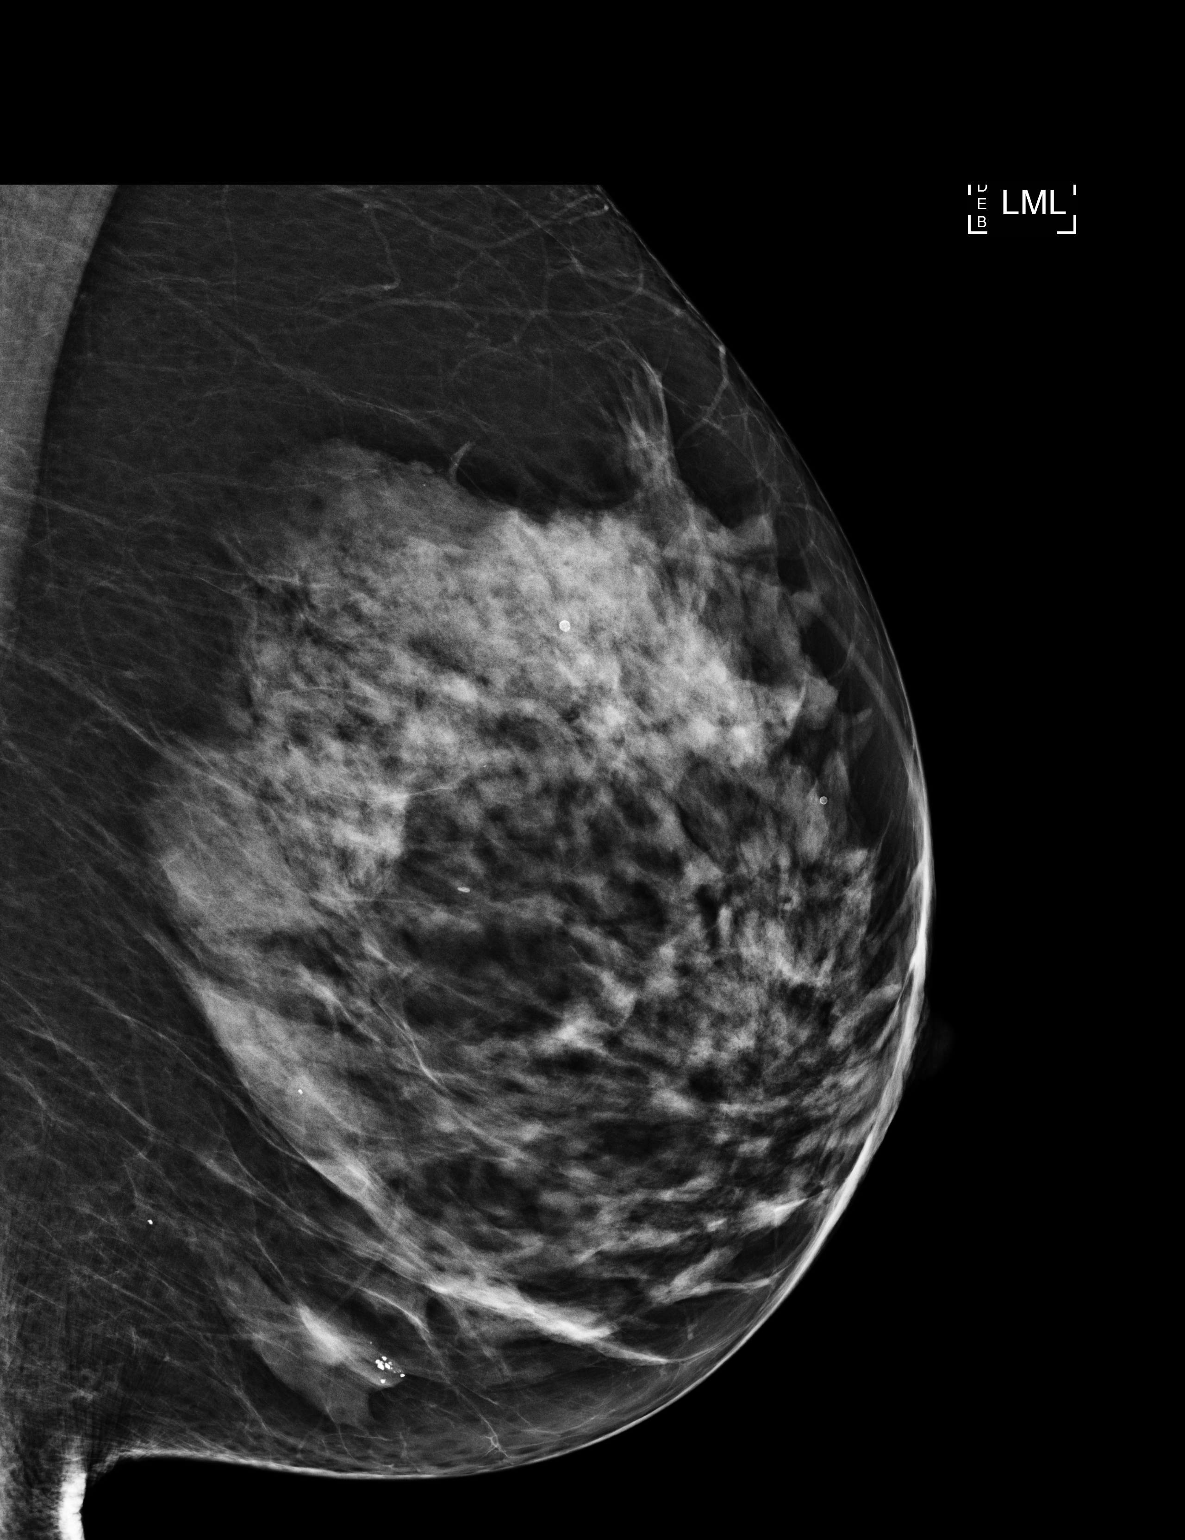

[L ML (2 of 2)]
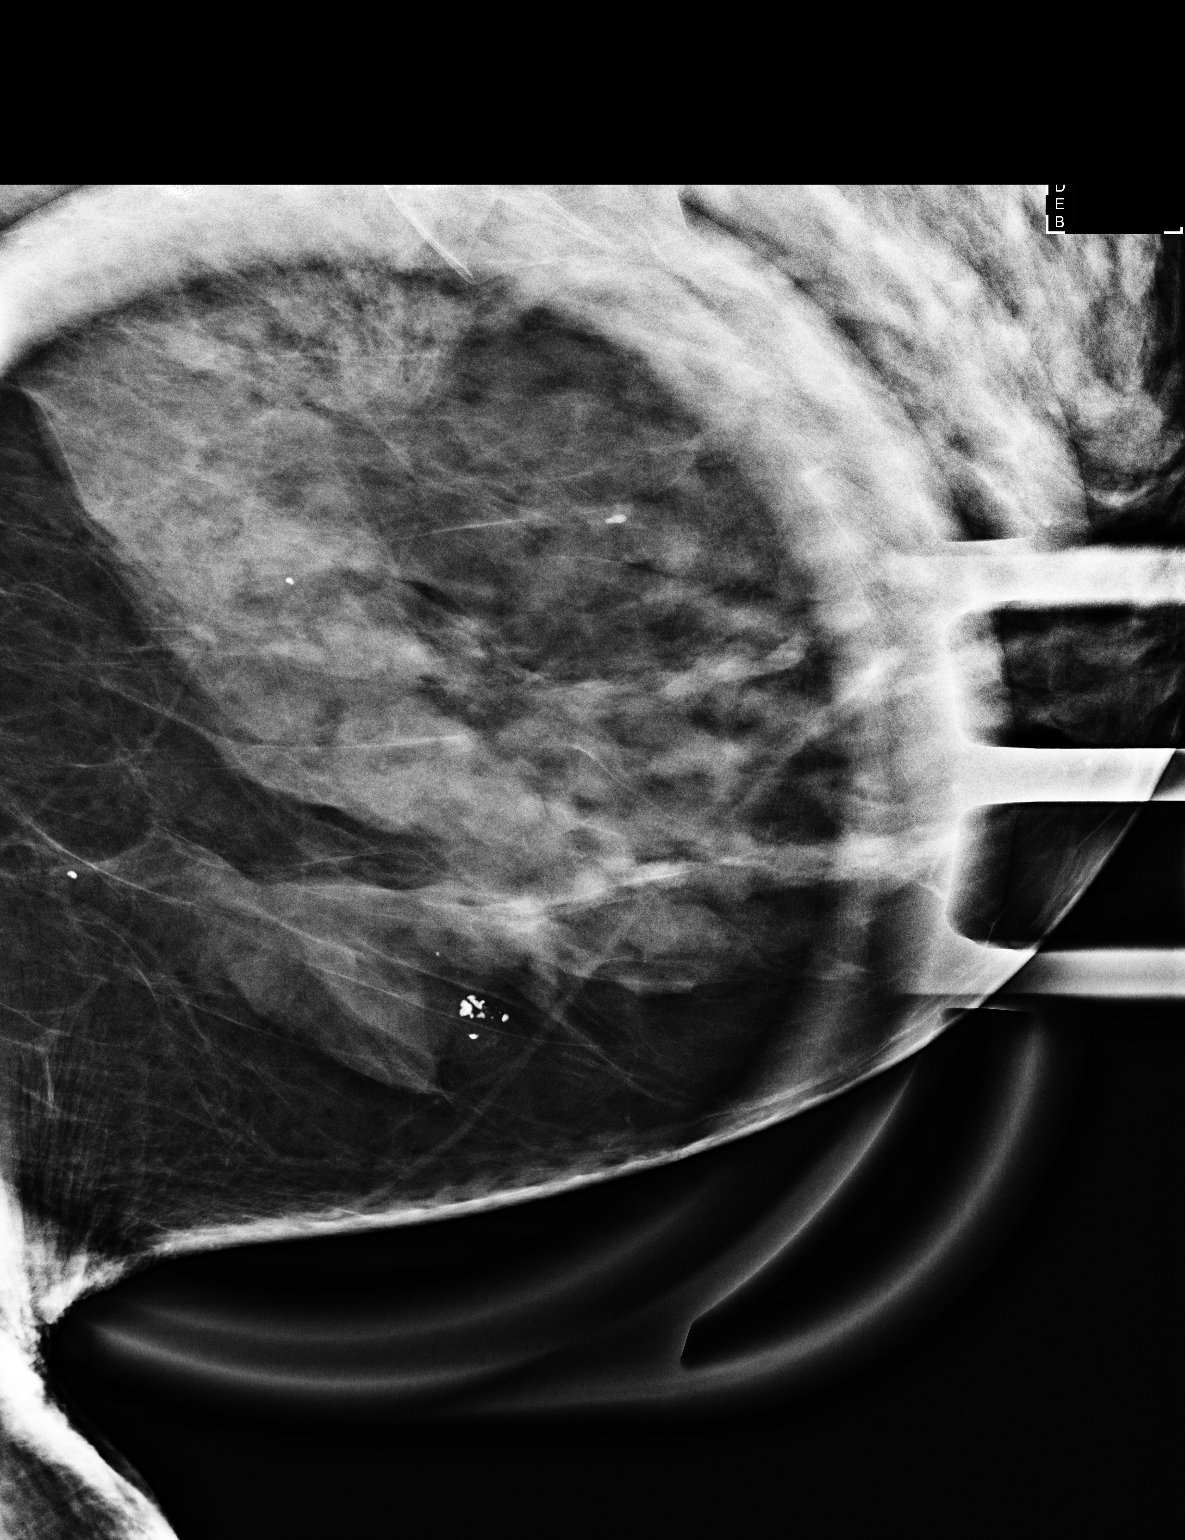

[L CC]
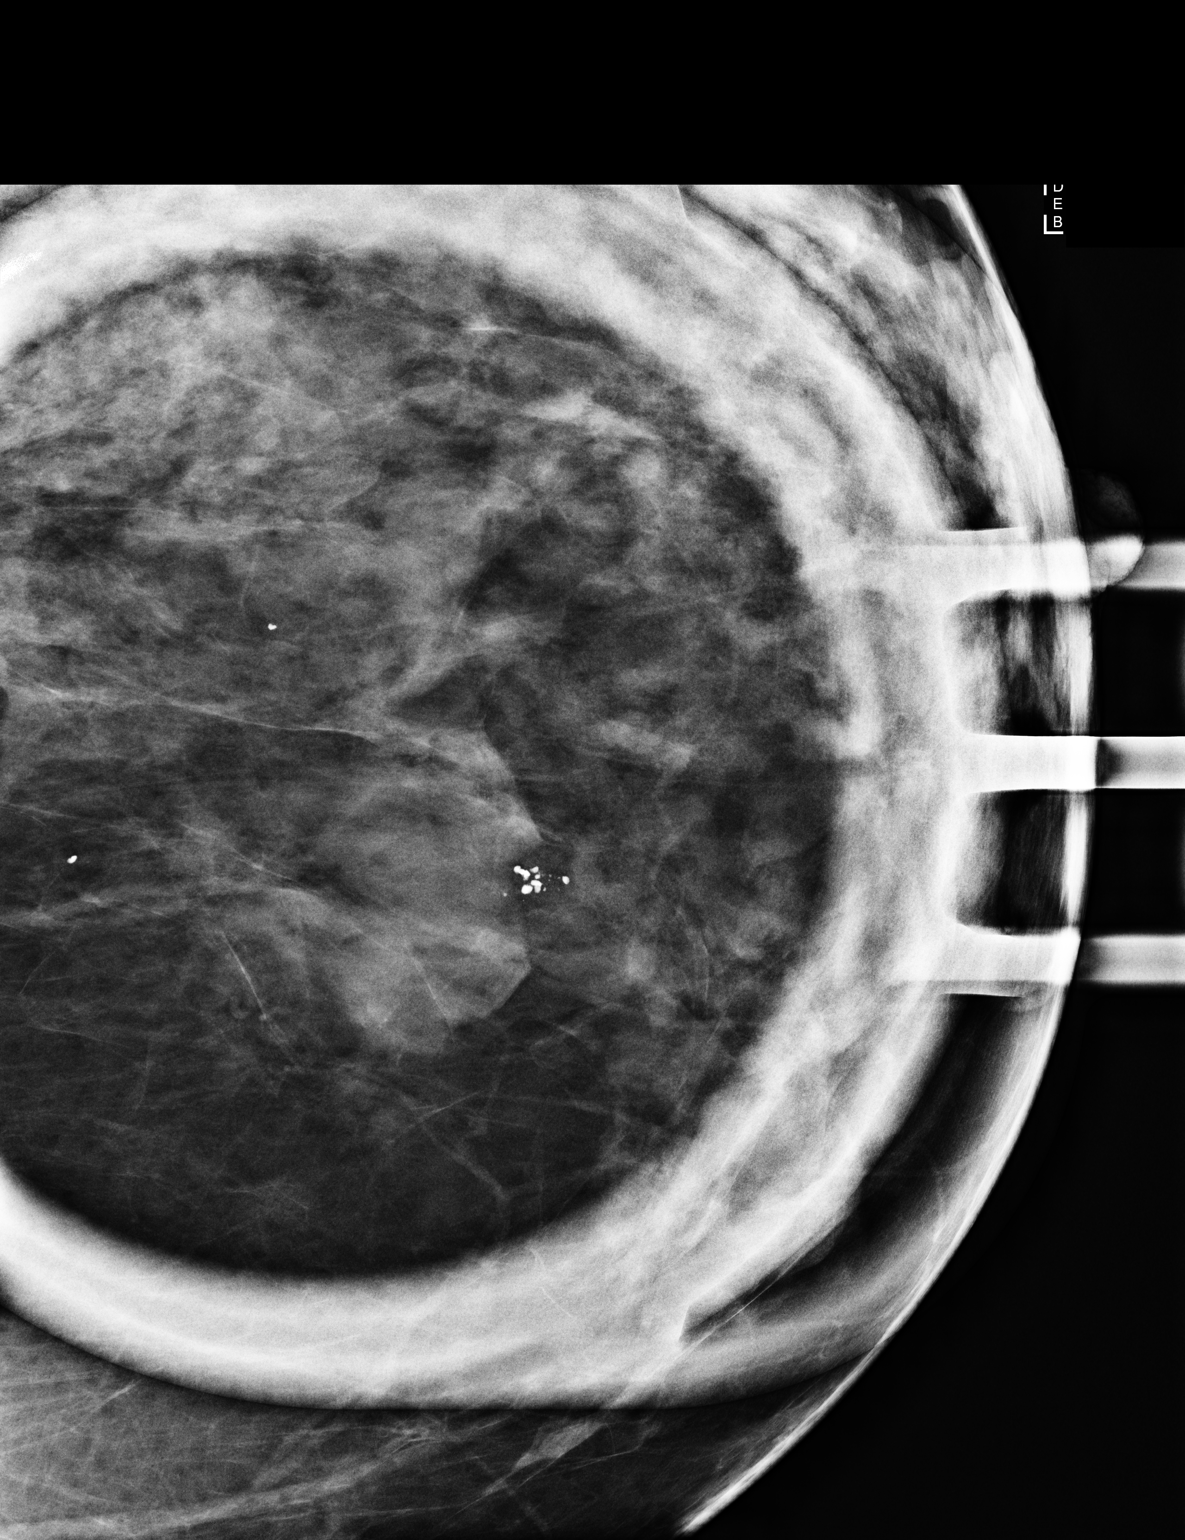

[3 of 3 positions shown; findings below may reference images not displayed]

ACR Breast Density Category c: The breast tissue is heterogeneously
dense, which may obscure small masses.
FINDINGS: Additional imaging of the left breast was performed there is coarse
heterogeneous calcifications in the lower-inner quadrant of the left
breast. There is an associated 1.7 cm mass.

Mammographic images were processed with CAD.

On physical exam, I do not palpate a mass in the lower-inner
quadrant of the left breast.

Targeted ultrasound is performed, showing an irregular hypoechoic
mass in the left breast at 7 o'clock 4 cm from the nipple measuring
1.5 x 1.1 x 0.9 cm. There are echogenic foci at the peripheral
margin the mass consistent with calcifications. Sonographic
evaluation of the left axilla does not show any enlarged adenopathy.
IMPRESSION: Suspicious mass and calcifications in the 7 o'clock region of the
left breast.

RECOMMENDATION:
Ultrasound-guided core biopsy of the mass and calcifications in the
7 o'clock region of the left breast is recommended.

I have discussed the findings and recommendations with the patient.
If applicable, a reminder letter will be sent to the patient
regarding the next appointment.

BI-RADS CATEGORY  4: Suspicious.

## 2021-06-08 ENCOUNTER — Ambulatory Visit
Admission: RE | Admit: 2021-06-08 | Discharge: 2021-06-08 | Disposition: A | Discharge status: Home / Self Care | Payer: Medicare Other | Source: Ambulatory Visit | Attending: Physician Assistant | Admitting: Physician Assistant

## 2021-06-08 DIAGNOSIS — Z1231 Encounter for screening mammogram for malignant neoplasm of breast: Secondary | ICD-10-CM | POA: Diagnosis present

## 2021-06-08 DIAGNOSIS — M85852 Other specified disorders of bone density and structure, left thigh: Secondary | ICD-10-CM | POA: Diagnosis present

## 2021-06-08 DIAGNOSIS — M85851 Other specified disorders of bone density and structure, right thigh: Secondary | ICD-10-CM | POA: Diagnosis present

## 2021-06-14 ENCOUNTER — Ambulatory Visit: Payer: Medicare Other | Admitting: Dermatology

## 2021-06-16 IMAGING — MG MM BREAST LOCALIZATION CLIP
4 series · 4 of 12 positions shown · non-contrast
Comparison: Previous exam(s).

CLINICAL DATA: Evaluate biopsy marker

EXAM:
DIAGNOSTIC LEFT MAMMOGRAM POST ULTRASOUND BIOPSY

[L ML synth-2D]
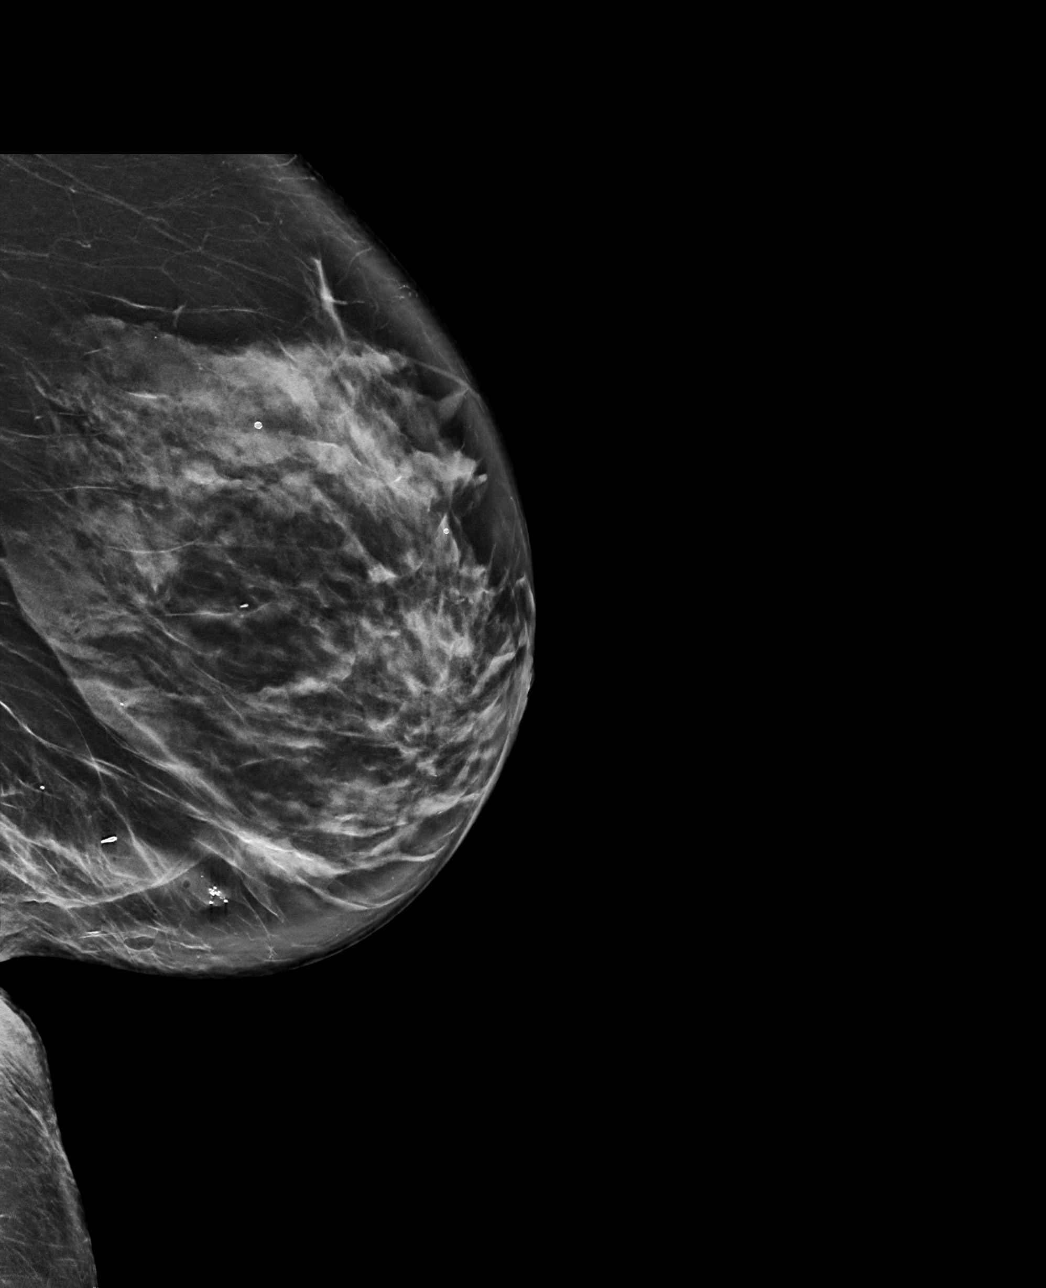

[L CC synth-2D]
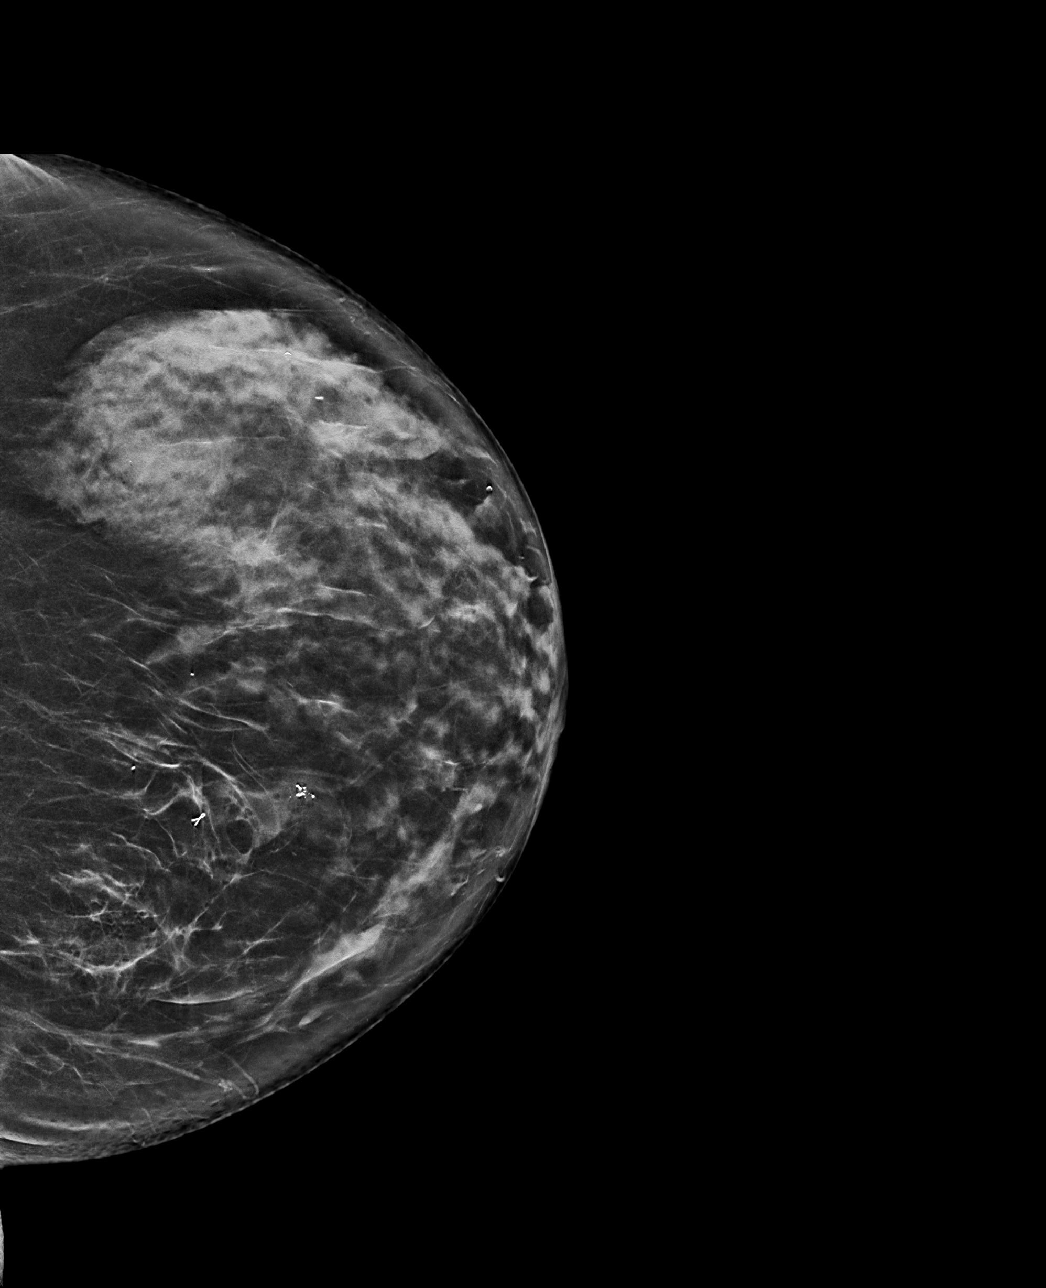

[L ML tomo · tomo slice 44/87.0]
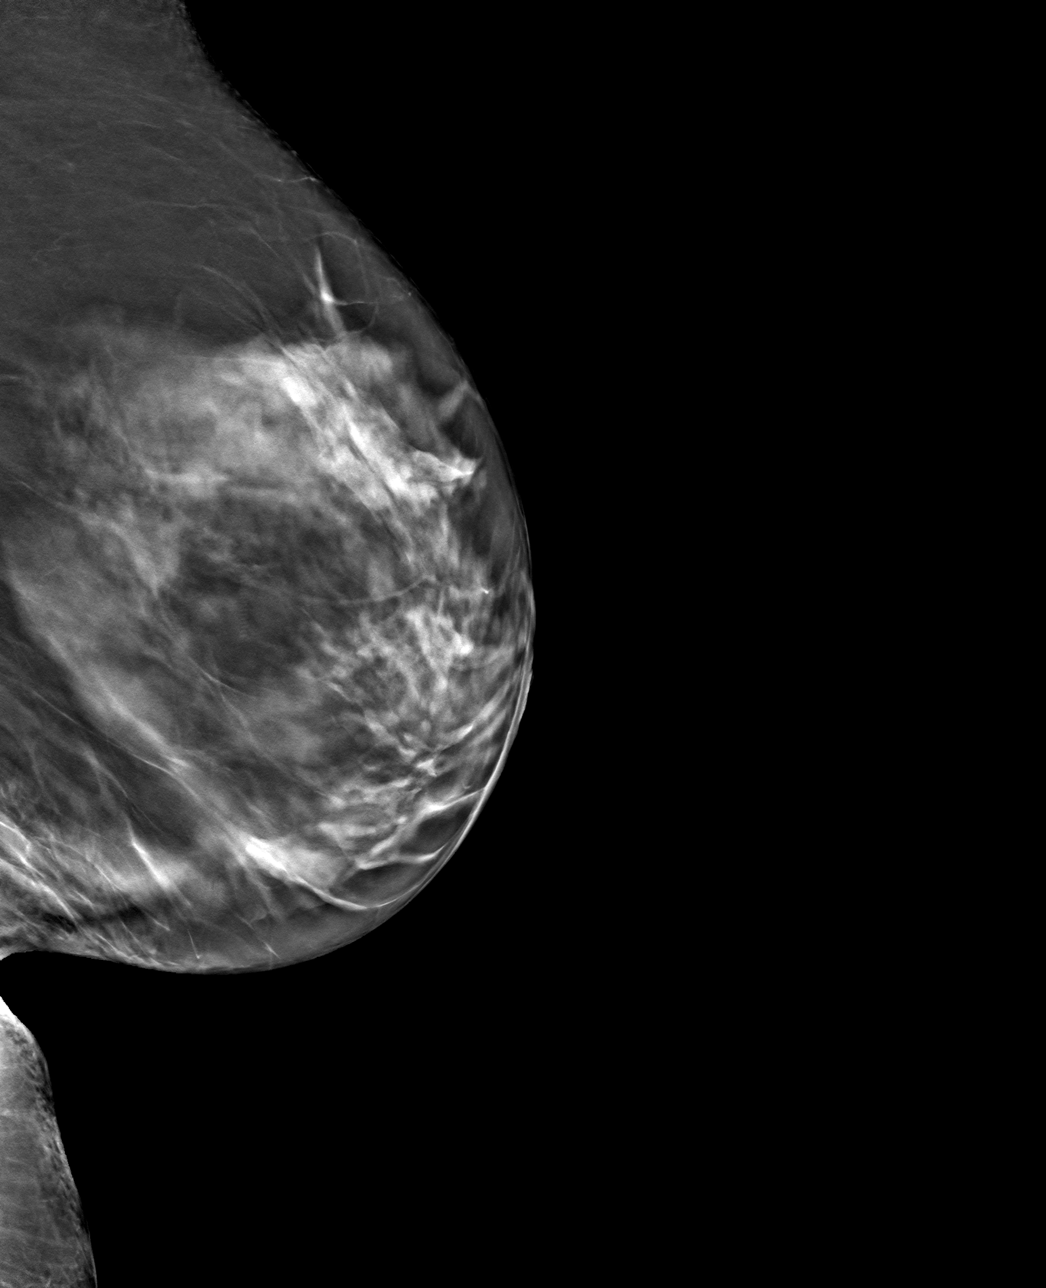

[L CC tomo · tomo slice 38/75.0]
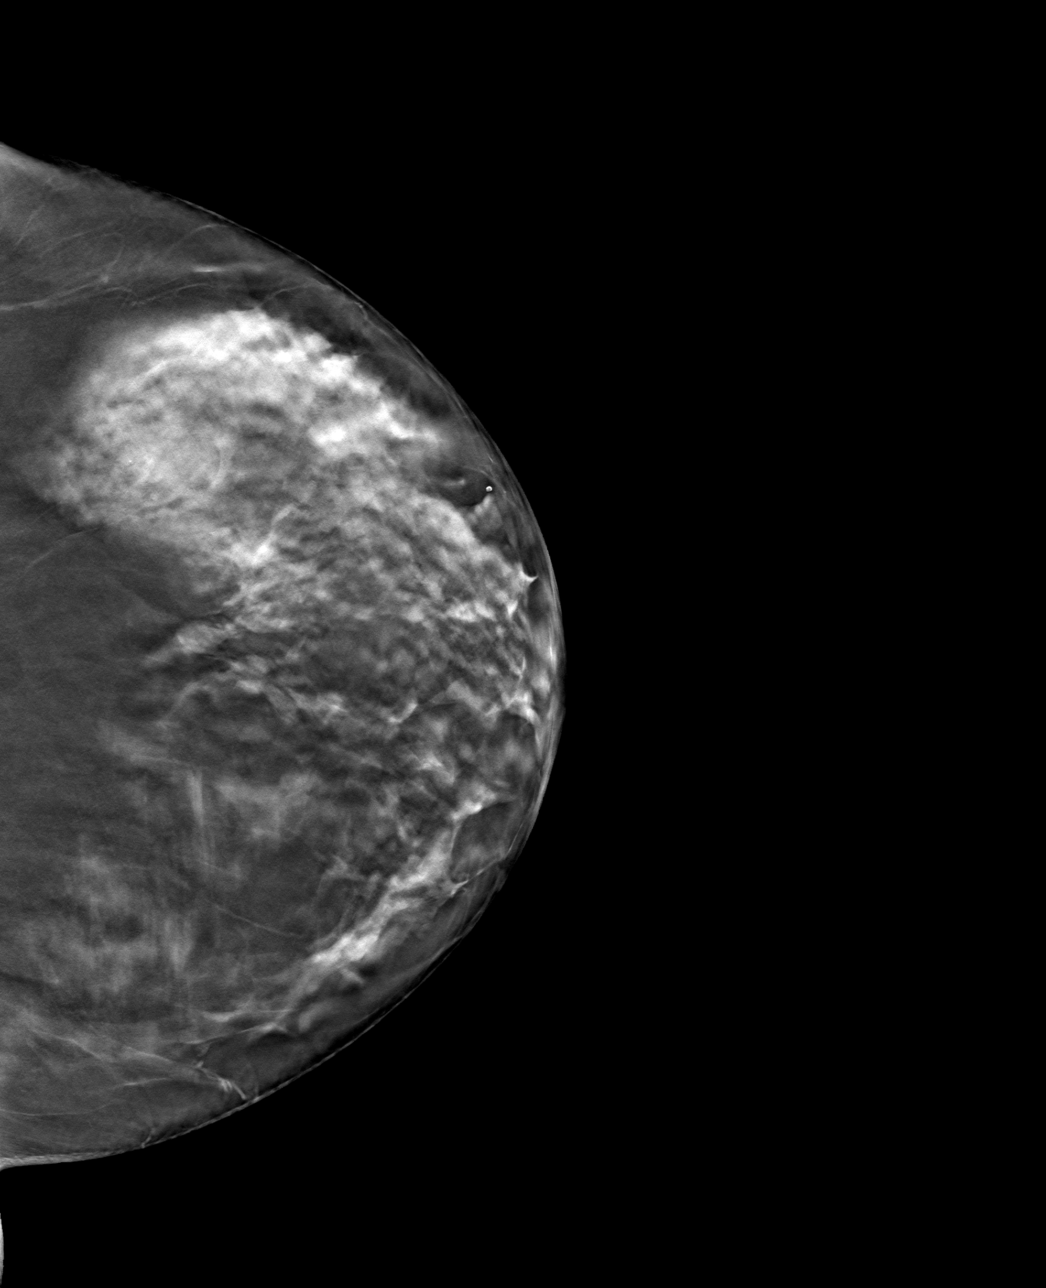

[4 of 12 positions shown; findings below may reference images not displayed]

FINDINGS: Mammographic images were obtained following ultrasound guided biopsy
of a left breast mass containing calcifications. The biopsy marking
clip is immediately posterior to the biopsied mass.
IMPRESSION: Appropriate positioning of the ribbon shaped shaped biopsy marking
clip just posterior to the biopsied mass.

Final Assessment: Post Procedure Mammograms for Marker Placement

## 2021-10-06 ENCOUNTER — Other Ambulatory Visit: Payer: Self-pay | Admitting: Family Medicine

## 2021-10-06 DIAGNOSIS — N1831 Chronic kidney disease, stage 3a: Secondary | ICD-10-CM

## 2021-10-06 DIAGNOSIS — J301 Allergic rhinitis due to pollen: Secondary | ICD-10-CM

## 2021-10-06 NOTE — Telephone Encounter (Signed)
Requested Prescriptions  Pending Prescriptions Disp Refills  . montelukast (SINGULAIR) 10 MG tablet [Pharmacy Med Name: MONTELUKAST SOD 10 MG TABLET] 90 tablet 0    Sig: TAKE 1 TABLET BY MOUTH EVERYDAY AT BEDTIME     Pulmonology:  Leukotriene Inhibitors Passed - 10/06/2021  2:14 AM      Passed - Valid encounter within last 12 months    Recent Outpatient Visits          1 year ago Essential hypertension   [facility] Birdie Sons, [facility]   1 year ago Colon cancer screening   [facility] Birdie Sons, MD   1 year ago Essential hypertension   [facility] [place] Malling M, [place]   2 years ago Diverticulitis of large intestine without perforation or abscess without bleeding   [facility], Clearnce Sorrel, [place]   3 years ago Medicare annual wellness visit, subsequent   [place], Clearnce Sorrel, [place]      Future Appointments            In 6 days Mikey Kirschner, [name] [employer], [place]

## 2021-10-11 NOTE — Progress Notes (Unsigned)
   I,Abigail Acosta,acting as a scribe for Abigail Drubel, [name].,have documented all relevant documentation on the behalf of Abigail Drubel, [name],as directed by  Abigail Drubel, [name] while in the presence of Abigail Drubel, [name].   Established patient visit   Patient: Abigail Acosta   DOB: 12/21/1940   81 y.o. Female  MRN: [id number] Visit Date: 10/12/2021  Today's healthcare provider: Lindsay Drubel, [name]   No chief complaint on file.  Subjective    HPI  Lipid/Cholesterol, Follow-up  Last lipid panel Other pertinent labs  Lab Results  Component Value Date   CHOL 221 (H) 04/11/2021   HDL 71 04/11/2021   LDLCALC 129 (H) 04/11/2021   TRIG 119 04/11/2021   CHOLHDL 3.1 04/11/2021   Lab Results  Component Value Date   ALT 30 04/11/2021   AST 34 04/11/2021   PLT 182 10/05/2020   TSH 6.710 (H) 04/11/2021     She was last seen for this 6 months ago.  Management since that visit includes continue lovastatin 20mg.  She reports {excellent/good/fair/poor:19665} compliance with treatment. She {is/is not:9024} having side effects. {document side effects if present:1}  Symptoms: {Yes/No:20286} chest pain {Yes/No:20286} chest pressure/discomfort  {Yes/No:20286} dyspnea {Yes/No:20286} lower extremity edema  {Yes/No:20286} numbness or tingling of extremity {Yes/No:20286} orthopnea  {Yes/No:20286} palpitations {Yes/No:20286} paroxysmal nocturnal dyspnea  {Yes/No:20286} speech difficulty {Yes/No:20286} syncope   Current diet: {diet habits:16563} Current exercise: {exercise types:16438}  The ASCVD Risk score (Arnett DK, et al., 2019) failed to calculate for the following reasons:   The 2019 ASCVD risk score is only valid for ages 40 to 79  ---------------------------------------------------------------------------------------------------  Hypertension, follow-up  BP Readings from Last 3 Encounters:  04/11/21 (!) 148/60  10/05/20 (!) 199/74  07/01/20 (!) 157/81   Wt Readings  from Last 3 Encounters:  04/11/21 132 lb 14.4 oz (60.3 kg)  10/05/20 131 lb (59.4 kg)  07/01/20 131 lb 3.2 oz (59.5 kg)     She was last seen for hypertension 6 months ago.  BP at that visit was 148/60. Management since that visit includes amlodipine 2.5 mg at bedtime, and lisinopril/hctz 10/12.5 1/2 a pill if systolic is 130-150 in AM or a full pill if systolic is > 150 in AM.  She reports {excellent/good/fair/poor:19665} compliance with treatment. She {is/is not:9024} having side effects. {document side effects if present:1} She is following a {diet:21022986} diet. She {is/is not:9024} exercising. She {does/does not:200015} smoke.  Use of agents associated with hypertension: {bp agents assoc with hypertension:511::"none"}.   Outside blood pressures are {***enter patient reported home BP readings, or 'not being checked':1}. Symptoms: {Yes/No:20286} chest pain {Yes/No:20286} chest pressure  {Yes/No:20286} palpitations {Yes/No:20286} syncope  {Yes/No:20286} dyspnea {Yes/No:20286} orthopnea  {Yes/No:20286} paroxysmal nocturnal dyspnea {Yes/No:20286} lower extremity edema   Pertinent labs Lab Results  Component Value Date   CHOL 221 (H) 04/11/2021   HDL 71 04/11/2021   LDLCALC 129 (H) 04/11/2021   TRIG 119 04/11/2021   CHOLHDL 3.1 04/11/2021   Lab Results  Component Value Date   NA 138 04/11/2021   K 4.3 04/11/2021   CREATININE 1.73 (H) 04/11/2021   EGFR 30 (L) 04/11/2021   GLUCOSE 102 (H) 04/11/2021   TSH 6.710 (H) 04/11/2021     The ASCVD Risk score (Arnett DK, et al., 2019) failed to calculate for the following reasons:   The 2019 ASCVD risk score is only valid for ages 40 to 79  ---------------------------------------------------------------------------------------------------  Hypothyroid, follow-up  Lab Results  Component Value Date   TSH 6.710 (H) 04/11/2021     FREET4 1.02 04/11/2021    Wt Readings from Last 3 Encounters:  04/11/21 132 lb 14.4 oz (60.3 kg)   10/05/20 131 lb (59.4 kg)  07/01/20 131 lb 3.2 oz (59.5 kg)    She was last seen for hypothyroid 6 months ago.  Management since that visit includes none. Previous thyroid studies include TSH and free T4  Symptoms: {Yes/No:20286} change in energy level {Yes/No:20286} constipation  {Yes/No:20286} diarrhea {Yes/No:20286} heat / cold intolerance  {Yes/No:20286} nervousness {Yes/No:20286} palpitations  {Yes/No:20286} weight changes    -----------------------------------------------------------------------------------------  Medications: Outpatient Medications Prior to Visit  Medication Sig   acetaminophen (TYLENOL) 500 MG tablet Take 500-1,000 mg by mouth 2 (two) times daily as needed (back pain.).   amLODipine (NORVASC) 2.5 MG tablet TAKE 1 TABLET BY MOUTH EVERY EVENING.   b complex vitamins capsule Take 1 capsule by mouth daily.   Biotin 1000 MCG tablet Take 1,000 mcg by mouth daily with lunch.   cyanocobalamin 1000 MCG tablet Take 1,000 mcg by mouth daily.   lisinopril-hydrochlorothiazide (ZESTORETIC) 10-12.5 MG tablet Take 0.5 tablets by mouth daily.   loratadine (CLARITIN) 10 MG tablet Take 1 tablet (10 mg total) by mouth daily.   lovastatin (MEVACOR) 20 MG tablet TAKE 1 TABLET BY MOUTH EVERY DAY IN THE EVENING   montelukast (SINGULAIR) 10 MG tablet TAKE 1 TABLET BY MOUTH EVERYDAY AT BEDTIME   Multiple Vitamin (MULTIVITAMIN WITH MINERALS) TABS tablet Take 1 tablet by mouth daily with lunch. Women's Multivitamin   omeprazole (PRILOSEC) 20 MG capsule TAKE 1 CAPSULE BY MOUTH EVERY DAY   No facility-administered medications prior to visit.    Review of Systems  {Labs  Heme  Chem  Endocrine  Serology  Results Review (optional):23779}   Objective    There were no vitals taken for this visit. {Show previous vital signs (optional):23777}  Physical Exam  ***  No results found for any visits on 10/12/21.  Assessment & Plan     ***  No follow-ups on file.       {provider attestation***:1}   Abigail Drubel, [name]  [facility] [phone] (phone) [phone] (fax)  [facility]  

## 2021-10-12 ENCOUNTER — Ambulatory Visit (INDEPENDENT_AMBULATORY_CARE_PROVIDER_SITE_OTHER): Payer: Medicare Other | Admitting: Physician Assistant

## 2021-10-12 ENCOUNTER — Encounter: Payer: Self-pay | Admitting: Physician Assistant

## 2021-10-12 VITALS — BP 148/75 | HR 71 | Ht 61.5 in | Wt 131.7 lb

## 2021-10-12 DIAGNOSIS — E7841 Elevated Lipoprotein(a): Secondary | ICD-10-CM | POA: Diagnosis not present

## 2021-10-12 DIAGNOSIS — Z23 Encounter for immunization: Secondary | ICD-10-CM

## 2021-10-12 DIAGNOSIS — I10 Essential (primary) hypertension: Secondary | ICD-10-CM | POA: Diagnosis not present

## 2021-10-12 DIAGNOSIS — R7989 Other specified abnormal findings of blood chemistry: Secondary | ICD-10-CM

## 2021-10-12 DIAGNOSIS — N1831 Chronic kidney disease, stage 3a: Secondary | ICD-10-CM | POA: Diagnosis not present

## 2021-10-12 DIAGNOSIS — K219 Gastro-esophageal reflux disease without esophagitis: Secondary | ICD-10-CM | POA: Diagnosis not present

## 2021-10-12 NOTE — Assessment & Plan Note (Signed)
Stable, manages with omeprazole 20 mg

## 2021-10-12 NOTE — Assessment & Plan Note (Addendum)
Pt has d/c statin does not wish to start another. Will check lipids periodically so pt can direct diet. Advised in general she should adhere to a low salt, low cholesterol, low fat diet Recommended starting asa 81 mg-- pt states she cannot as it causes bleeding in her skin easily.  Advised the purpose behind statin and asa for protection of heart attack/stroke, but pt declines med management

## 2021-10-12 NOTE — Assessment & Plan Note (Signed)
Pt manages with amlodipine 2.5 mg at bedtime and lisinopril/hctz 10/12.5 mg 1/2 pill PRN BP > 167O systolic. Pt reports if she takes a full pill or even a half pill daily she gets orthostatic symptoms. Attempted to educate on htn control. bp elevated in office pt does not record home values. F/u 6 mo

## 2021-10-12 NOTE — Assessment & Plan Note (Signed)
Repeat cmp today

## 2021-10-13 LAB — COMPREHENSIVE METABOLIC PANEL
ALT: 16 IU/L (ref 0–32)
AST: 25 IU/L (ref 0–40)
Albumin/Globulin Ratio: 2.1 (ref 1.2–2.2)
Albumin: 4.8 g/dL (ref 3.8–4.8)
Alkaline Phosphatase: 57 IU/L (ref 44–121)
BUN/Creatinine Ratio: 27 (ref 12–28)
BUN: 37 mg/dL — ABNORMAL HIGH (ref 8–27)
Bilirubin Total: 0.4 mg/dL (ref 0.0–1.2)
CO2: 20 mmol/L (ref 20–29)
Calcium: 10 mg/dL (ref 8.7–10.3)
Chloride: 105 mmol/L (ref 96–106)
Creatinine, Ser: 1.38 mg/dL — ABNORMAL HIGH (ref 0.57–1.00)
Globulin, Total: 2.3 g/dL (ref 1.5–4.5)
Glucose: 90 mg/dL (ref 70–99)
Potassium: 3.9 mmol/L (ref 3.5–5.2)
Sodium: 141 mmol/L (ref 134–144)
Total Protein: 7.1 g/dL (ref 6.0–8.5)
eGFR: 39 mL/min/{1.73_m2} — ABNORMAL LOW (ref >59)

## 2021-10-13 LAB — CBC WITH DIFFERENTIAL/PLATELET
Basophils Absolute: 0 10*3/uL (ref 0.0–0.2)
Basos: 1 %
EOS (ABSOLUTE): 0.2 10*3/uL (ref 0.0–0.4)
Eos: 6 %
Hematocrit: 40.2 % (ref 34.0–46.6)
Hemoglobin: 13.7 g/dL (ref 11.1–15.9)
Immature Grans (Abs): 0 10*3/uL (ref 0.0–0.1)
Immature Granulocytes: 1 %
Lymphocytes Absolute: 1.6 10*3/uL (ref 0.7–3.1)
Lymphs: 42 %
MCH: 32.1 pg (ref 26.6–33.0)
MCHC: 34.1 g/dL (ref 31.5–35.7)
MCV: 94 fL (ref 79–97)
Monocytes Absolute: 0.3 10*3/uL (ref 0.1–0.9)
Monocytes: 9 %
Neutrophils Absolute: 1.5 10*3/uL (ref 1.4–7.0)
Neutrophils: 41 %
Platelets: 167 10*3/uL (ref 150–450)
RBC: 4.27 x10E6/uL (ref 3.77–5.28)
RDW: 12.3 % (ref 11.7–15.4)
WBC: 3.7 10*3/uL (ref 3.4–10.8)

## 2021-10-13 LAB — LIPID PANEL
Chol/HDL Ratio: 3.8 ratio (ref 0.0–4.4)
Cholesterol, Total: 216 mg/dL — ABNORMAL HIGH (ref 100–199)
HDL: 57 mg/dL (ref >39)
LDL Chol Calc (NIH): 137 mg/dL — ABNORMAL HIGH (ref 0–99)
Triglycerides: 124 mg/dL (ref 0–149)
VLDL Cholesterol Cal: 22 mg/dL (ref 5–40)

## 2021-10-13 LAB — TSH+FREE T4
Free T4: 1.11 ng/dL (ref 0.82–1.77)
TSH: 5.64 u[IU]/mL — ABNORMAL HIGH (ref 0.450–4.500)

## 2021-11-12 ENCOUNTER — Other Ambulatory Visit: Payer: Self-pay | Admitting: Family Medicine

## 2021-11-12 DIAGNOSIS — I10 Essential (primary) hypertension: Secondary | ICD-10-CM

## 2021-11-13 NOTE — Telephone Encounter (Signed)
Requested Prescriptions  Pending Prescriptions Disp Refills  . lisinopril-hydrochlorothiazide (ZESTORETIC) 10-12.5 MG tablet [Pharmacy Med Name: LISINOPRIL-HCTZ 10-12.5 MG TAB] 45 tablet 1    Sig: TAKE 1/2 TABLET BY MOUTH EVERY DAY     Cardiovascular:  ACEI + Diuretic Combos Failed - 11/12/2021  9:37 AM      Failed - Cr in normal range and within 180 days    Creatinine, Ser  Date Value Ref Range Status  10/12/2021 1.38 (H) 0.57 - 1.00 mg/dL Final         Failed - Last BP in normal range    BP Readings from Last 1 Encounters:  10/12/21 (!) 148/75         Passed - Na in normal range and within 180 days    Sodium  Date Value Ref Range Status  10/12/2021 141 134 - 144 mmol/L Final         Passed - K in normal range and within 180 days    Potassium  Date Value Ref Range Status  10/12/2021 3.9 3.5 - 5.2 mmol/L Final         Passed - eGFR is 30 or above and within 180 days    GFR calc Af Amer  Date Value Ref Range Status  10/09/2019 61 >59 mL/min/1.73 Final    Comment:    **[facility] currently reports eGFR in compliance with the current**   recommendations of the [employer]. [facility] will   update reporting as new guidelines are published from the NKF-ASN   Task force.    GFR calc non Af Amer  Date Value Ref Range Status  10/09/2019 53 (L) >59 mL/min/1.73 Final   eGFR  Date Value Ref Range Status  10/12/2021 39 (L) >59 mL/min/1.73 Final         Passed - Patient is not pregnant      Passed - Valid encounter within last 6 months    Recent Outpatient Visits          1 month ago Elevated blood pressure reading in office with white coat syndrome, with diagnosis of hypertension   [facility] Mikey Kirschner, [name]   1 year ago Essential hypertension   [facility] Birdie Sons, MD   1 year ago Colon cancer screening   [facility] Birdie Sons, MD   2 years ago Essential hypertension   [facility] Fenton Malling M, [place]   2 years ago Diverticulitis of large intestine without perforation or abscess without bleeding   [facility], [place], [place]

## 2022-01-10 ENCOUNTER — Other Ambulatory Visit: Payer: Self-pay | Admitting: Physician Assistant

## 2022-01-10 DIAGNOSIS — J301 Allergic rhinitis due to pollen: Secondary | ICD-10-CM

## 2022-01-10 DIAGNOSIS — N1831 Chronic kidney disease, stage 3a: Secondary | ICD-10-CM

## 2022-02-19 ENCOUNTER — Other Ambulatory Visit: Payer: Self-pay | Admitting: Family Medicine

## 2022-02-19 DIAGNOSIS — K219 Gastro-esophageal reflux disease without esophagitis: Secondary | ICD-10-CM

## 2022-02-20 NOTE — Telephone Encounter (Signed)
Requested Prescriptions  Pending Prescriptions Disp Refills   omeprazole (PRILOSEC) 20 MG capsule [Pharmacy Med Name: OMEPRAZOLE DR 20 MG CAPSULE] 90 capsule 0    Sig: TAKE 1 CAPSULE BY MOUTH EVERY DAY     Gastroenterology: Proton Pump Inhibitors Passed - 02/19/2022 12:14 PM      Passed - Valid encounter within last 12 months    Recent Outpatient Visits           4 months ago Elevated blood pressure reading in office with white coat syndrome, with diagnosis of hypertension   [place] Mikey Kirschner, [name]   1 year ago Essential hypertension   [place], Donald E, MD   1 year ago Colon cancer screening   [place], Donald E, MD   2 years ago Essential hypertension   [place], Anderson [place] M, [place]   2 years ago Diverticulitis of large intestine without perforation or abscess without bleeding   [facility] [place], [place], [place]

## 2022-03-12 ENCOUNTER — Telehealth: Payer: Self-pay | Admitting: Physician Assistant

## 2022-03-12 NOTE — Telephone Encounter (Signed)
Called patient to schedule Medicare Annual Wellness Visit (AWV). Left message for patient to call back and schedule Medicare Annual Wellness Visit (AWV).  Last date of AWV: 04/11/2021  Please schedule an appointment at any time with [facility].  If any questions, please contact me at [phone].  Thank you ,  [place] Direct Dial: [phone]

## 2022-03-22 ENCOUNTER — Encounter: Payer: Self-pay | Admitting: Physician Assistant

## 2022-03-22 ENCOUNTER — Ambulatory Visit: Payer: Self-pay

## 2022-03-22 ENCOUNTER — Ambulatory Visit (INDEPENDENT_AMBULATORY_CARE_PROVIDER_SITE_OTHER): Payer: Medicare Other | Admitting: Physician Assistant

## 2022-03-22 VITALS — BP 140/70 | Wt 131.4 lb

## 2022-03-22 DIAGNOSIS — H1013 Acute atopic conjunctivitis, bilateral: Secondary | ICD-10-CM | POA: Diagnosis not present

## 2022-03-22 MED ORDER — PREDNISONE 10 MG PO TABS: ORAL_TABLET | ORAL | 0 refills | Status: AC

## 2022-03-22 NOTE — Progress Notes (Signed)
I,[name],acting as a Education administrator for [employer], [name].,have documented all relevant documentation on the behalf of Mikey Kirschner, [name],as directed by  Mikey Kirschner, [name] while in the presence of Mikey Kirschner, [name].   Established patient visit   Patient: Abigail Acosta   DOB: 11-20-1940   82 y.o. Female  MRN: [id number] Visit Date: 03/22/2022  Today's healthcare provider: Mikey Kirschner, [name]   Cc. Watery, red, swollen eyes  Subjective     Pt reports overnight b/l eyes became watery, irritated, blurry ,red. The skin around her eyes is also dry and red. Denies any new contact irritants. Tried warm compresses relief.  Denies pain, reports pressure. Denies changes in sight aside from blurred vision 22 tearing. Some associated nasal congestion.  Medications: Outpatient Medications Prior to Visit  Medication Sig   acetaminophen (TYLENOL) 500 MG tablet Take 500-1,000 mg by mouth 2 (two) times daily as needed (back pain.).   amLODipine (NORVASC) 2.5 MG tablet TAKE 1 TABLET BY MOUTH EVERY EVENING.   b complex vitamins capsule Take 1 capsule by mouth daily.   Biotin 1000 MCG tablet Take 1,000 mcg by mouth daily with lunch.   cyanocobalamin 1000 MCG tablet Take 1,000 mcg by mouth daily.   lisinopril-hydrochlorothiazide (ZESTORETIC) 10-12.5 MG tablet TAKE 1/2 TABLET BY MOUTH EVERY DAY   loratadine (CLARITIN) 10 MG tablet Take 1 tablet (10 mg total) by mouth daily. (Patient taking differently: Take 10 mg by mouth daily. As needed)   montelukast (SINGULAIR) 10 MG tablet TAKE 1 TABLET BY MOUTH EVERYDAY AT BEDTIME   Multiple Vitamin (MULTIVITAMIN WITH MINERALS) TABS tablet Take 1 tablet by mouth daily with lunch. Women's Multivitamin   omeprazole (PRILOSEC) 20 MG capsule TAKE 1 CAPSULE BY MOUTH EVERY DAY   No facility-administered medications prior to visit.    Review of Systems  Eyes:  Positive for photophobia, discharge and redness.     Objective    BP (!) 140/70 (BP  Location: Left Arm, Patient Position: Sitting, Cuff Size: Normal)   Wt 131 lb 6.4 oz (59.6 kg)   SpO2 100%   BMI 24.43 kg/m   Physical Exam Vitals reviewed.  Constitutional:      Appearance: She is not ill-appearing.  HENT:     Head: Normocephalic.  Eyes:     Extraocular Movements: Extraocular movements intact.     Conjunctiva/sclera: Conjunctivae normal.     Pupils: Pupils are equal, round, and reactive to light.     Comments: B/l chemosis, erythema, watering. B/l eyelids swollen and ocular area has dermatitis - -red, flaking, dry skin   Cardiovascular:     Rate and Rhythm: Normal rate.  Pulmonary:     Effort: Pulmonary effort is normal. No respiratory distress.  Neurological:     General: No focal deficit present.     Mental Status: She is alert and oriented to person, place, and time.  Psychiatric:        Mood and Affect: Mood normal.        Behavior: Behavior normal.     No results found for any visits on 03/22/22.  Assessment & Plan     Allergic conjunctivitis Rx prednisone taper. Encouraged pt to see her optometrist  Pt was eval by Dr Brita Romp who agrees with dx and plan  Return if symptoms worsen or fail to improve.      I, Mikey Kirschner, [name] have reviewed all documentation for this visit. The documentation on  03/22/22  for the exam, diagnosis, procedures,  and orders are all accurate and complete.  Mikey Kirschner, [name] [facility] [address] [place], [place], [address] Office: [phone] Fax: [place]

## 2022-03-22 NOTE — Telephone Encounter (Signed)
Pt is calling to report both eyes are swollen shut, with discharge, and red, Pt is barely able to seeing. Please advise   Chief Complaint: Both eyes swollen, have drainage Symptoms: Above, runny nose cough Frequency: Yesterday Pertinent Negatives: Patient denies fever Disposition: '[]'$ ED /'[]'$ Urgent Care (no appt availability in office) / '[x]'$ Appointment(In office/virtual)/ '[]'$  [place] [facility]/ '[]'$ Home Care/ '[]'$ Refused Recommended Disposition /'[]'$ [place] [facility]/ '[]'$  Follow-up with PCP Additional Notes:   Reason for Disposition  Eye is very swollen (shut or almost)  Answer Assessment - Initial Assessment Questions 1. EYE DISCHARGE: "Is the discharge in one or both eyes?" "What color is it?" "How much is there?" "When did the discharge start?"      Both 2. REDNESS OF SCLERA: "Is the redness in one or both eyes?" "When did the redness start?"      Yes 3. EYELIDS: "Are the eyelids red or swollen?" If Yes, ask: "How much?"      Yes 4. VISION: "Is there any difficulty seeing clearly?"      Right is worse 5. PAIN: "Is there any pain? If Yes, ask: "How bad is it?" (Scale 1-10; or mild, moderate, severe)    - MILD (1-3): doesn't interfere with normal activities     - MODERATE (4-7): interferes with normal activities or awakens from sleep    - SEVERE (8-10): excruciating pain, unable to do any normal activities       Mild 6. CONTACT LENS: "Do you wear contacts?"     No 7. OTHER SYMPTOMS: "Do you have any other symptoms?" (e.g., fever, runny nose, cough)     Cough, runny nose 8. PREGNANCY: "Is there any chance you are pregnant?" "When was your last menstrual period?"     No  Protocols used: Eye - Pus or [place]

## 2022-03-23 ENCOUNTER — Encounter: Payer: Self-pay | Admitting: Physician Assistant

## 2022-03-24 ENCOUNTER — Other Ambulatory Visit: Payer: Self-pay | Admitting: Family Medicine

## 2022-03-24 DIAGNOSIS — N1831 Chronic kidney disease, stage 3a: Secondary | ICD-10-CM

## 2022-03-24 DIAGNOSIS — I10 Essential (primary) hypertension: Secondary | ICD-10-CM

## 2022-03-26 NOTE — Telephone Encounter (Signed)
Requested Prescriptions  Pending Prescriptions Disp Refills   amLODipine (NORVASC) 2.5 MG tablet [Pharmacy Med Name: AMLODIPINE BESYLATE 2.5 MG TAB] 90 tablet 0    Sig: TAKE 1 TABLET BY MOUTH EVERY DAY IN THE EVENING     Cardiovascular: Calcium Channel Blockers 2 Failed - 03/24/2022  9:04 AM      Failed - Last BP in normal range    BP Readings from Last 1 Encounters:  03/22/22 (!) 140/70         Passed - Last Heart Rate in normal range    Pulse Readings from Last 1 Encounters:  10/12/21 71         Passed - Valid encounter within last 6 months    Recent Outpatient Visits           4 days ago Allergic conjunctivitis of both eyes   [place] Mikey Kirschner, [name]   5 months ago Elevated blood pressure reading in office with white coat syndrome, with diagnosis of hypertension   [place] Mikey Kirschner, [name]   1 year ago Essential hypertension   [place], Donald E, MD   1 year ago Colon cancer screening   [place], Donald E, MD   2 years ago Essential hypertension   [place], Clearnce [place], [place]       Future Appointments             In 2 weeks Ralene Bathe, MD [place]

## 2022-04-03 ENCOUNTER — Telehealth: Payer: Self-pay | Admitting: Physician Assistant

## 2022-04-03 NOTE — Telephone Encounter (Signed)
Copied from [place] [id number]. Topic: Medicare AWV >> Apr 03, 2022  8:06 AM Erasmo Score wrote: Reason for CRM: Called patient to schedule Medicare Annual Wellness Visit (AWV). Left message for patient to call back and schedule Medicare Annual Wellness Visit (AWV).  Re-scheduled AWV on : 04/12/2022  Please schedule an appointment at any time with NHA.  If any questions, please contact me at [phone].  Thank you ,  [place] Direct Dial: [phone]

## 2022-04-03 NOTE — Telephone Encounter (Signed)
Copied from [place] [id number]. Topic: Medicare AWV >> Apr 03, 2022  8:16 AM Erasmo Score wrote: Reason for CRM: Contacted Abigail Acosta to schedule their annual wellness visit. Appointment made for 04/11/2022.  [place] Direct Dial: [phone]

## 2022-04-03 NOTE — Telephone Encounter (Signed)
Contacted Abigail Acosta to schedule their annual wellness visit. Appointment made for 04/11/2022.  [place] Direct Dial: [phone]

## 2022-04-10 ENCOUNTER — Encounter: Payer: Self-pay | Admitting: Dermatology

## 2022-04-10 ENCOUNTER — Ambulatory Visit (INDEPENDENT_AMBULATORY_CARE_PROVIDER_SITE_OTHER): Payer: Medicare Other | Admitting: Dermatology

## 2022-04-10 DIAGNOSIS — D229 Melanocytic nevi, unspecified: Secondary | ICD-10-CM | POA: Diagnosis not present

## 2022-04-10 DIAGNOSIS — L578 Other skin changes due to chronic exposure to nonionizing radiation: Secondary | ICD-10-CM

## 2022-04-10 DIAGNOSIS — L814 Other melanin hyperpigmentation: Secondary | ICD-10-CM

## 2022-04-10 DIAGNOSIS — L821 Other seborrheic keratosis: Secondary | ICD-10-CM | POA: Diagnosis not present

## 2022-04-10 DIAGNOSIS — L820 Inflamed seborrheic keratosis: Secondary | ICD-10-CM | POA: Diagnosis not present

## 2022-04-10 NOTE — Progress Notes (Unsigned)
   Follow-Up Visit   Subjective  Abigail Acosta is a 82 y.o. female who presents for the following: Spot - noticed a lesion on her L shoulder a few weeks ago, that she scratched in her sleep. A few of her friends have recently been diagnosed with cancer and she is concerned and would like her back checked today. The patient has spots, moles and lesions to be evaluated, some may be new or changing and the patient has concerns that these could be cancer.  The following portions of the chart were reviewed this encounter and updated as appropriate: medications, allergies, medical history  Review of Systems:  No other skin or systemic complaints except as noted in HPI or Assessment and Plan.  Objective  Well appearing patient in no apparent distress; mood and affect are within normal limits.  A focused examination was performed of the following areas: The face, trunk, extremities   Assessment & Plan   SEBORRHEIC KERATOSIS - Stuck-on, waxy, tan-brown papules and/or plaques  - Benign-appearing - Discussed benign etiology and prognosis. - Observe - Call for any changes  MELANOCYTIC NEVI Benign appearing on exam today. Recommend observation. Call clinic for new or changing moles. Recommend daily use of broad spectrum spf 30+ sunscreen to sun-exposed areas.  Exam Tan-brown and/or pink-flesh-colored symmetric macules and papules  Actinic Damage - chronic, secondary to cumulative UV radiation exposure/sun exposure over time - diffuse scaly erythematous macules with underlying dyspigmentation - Recommend daily broad spectrum sunscreen SPF 30+ to sun-exposed areas, reapply every 2 hours as needed.  - Recommend staying in the shade or wearing long sleeves, sun glasses (UVA+UVB protection) and wide brim hats (4-inch brim around the entire circumference of the hat). - Call for new or changing lesions.  Lentigines - Scattered tan macules - Due to sun exposure - Benign-appearing, observe -  Recommend daily broad spectrum sunscreen SPF 30+ to sun-exposed areas, reapply every 2 hours as needed. - Call for any changes  INFLAMED SEBORRHEIC KERATOSIS Exam: Symptomatic, irritating, patient would like treated.  Benign-appearing.  Call clinic for new or changing lesions.   Prior to procedure, discussed risks of blister formation, small wound, skin dyspigmentation, or rare scar following treatment. Recommend Vaseline ointment to treated areas while healing.  Destruction Procedure Note Destruction method: cryotherapy   Informed consent: discussed and consent obtained   Lesion destroyed using liquid nitrogen: Yes   Outcome: patient tolerated procedure well with no complications   Post-procedure details: wound care instructions given   Locations: L upper back x 5 # of Lesions Treated: 5  Return in about 1 year (around 04/10/2023) for TBSE.  Luther Redo, CMA, am acting as scribe for Sarina Ser, MD .  Documentation: I have reviewed the above documentation for accuracy and completeness, and I agree with the above.  Sarina Ser, MD

## 2022-04-10 NOTE — Patient Instructions (Signed)
Due to recent changes in healthcare laws, you may see results of your pathology and/or laboratory studies on MyChart before the doctors have had a chance to review them. We understand that in some cases there may be results that are confusing or concerning to you. Please understand that not all results are received at the same time and often the doctors may need to interpret multiple results in order to provide you with the best plan of care or course of treatment. Therefore, we ask that you please give us 2 business days to thoroughly review all your results before contacting the office for clarification. Should we see a critical lab result, you will be contacted sooner.   If You Need Anything After Your Visit  If you have any questions or concerns for your doctor, please call our main line at [phone] and press option 4 to reach your doctor's medical assistant. If no one answers, please leave a voicemail as directed and we will return your call as soon as possible. Messages left after 4 pm will be answered the following business day.   You may also send us a message via MyChart. We typically respond to MyChart messages within 1-2 business days.  For prescription refills, please ask your pharmacy to contact our office. Our fax number is [phone].  If you have an urgent issue when the clinic is closed that cannot wait until the next business day, you can page your doctor at the number below.    Please note that while we do our best to be available for urgent issues outside of office hours, we are not available 24/7.   If you have an urgent issue and are unable to reach us, you may choose to seek medical care at your doctor's office, retail clinic, urgent care center, or emergency room.  If you have a medical emergency, please immediately call 911 or go to the emergency department.  Pager Numbers  - Dr. Kowalski: [phone]  - Dr. Moye: [phone]  - Dr. Stewart:  [phone]  In the event of inclement weather, please call our main line at [phone] for an update on the status of any delays or closures.  Dermatology Medication Tips: Please keep the boxes that topical medications come in in order to help keep track of the instructions about where and how to use these. Pharmacies typically print the medication instructions only on the boxes and not directly on the medication tubes.   If your medication is too expensive, please contact our office at [phone] option 4 or send us a message through MyChart.   We are unable to tell what your co-pay for medications will be in advance as this is different depending on your insurance coverage. However, we may be able to find a substitute medication at lower cost or fill out paperwork to get insurance to cover a needed medication.   If a prior authorization is required to get your medication covered by your insurance company, please allow us 1-2 business days to complete this process.  Drug prices often vary depending on where the prescription is filled and some pharmacies may offer cheaper prices.  The website www.goodrx.com contains coupons for medications through different pharmacies. The prices here do not account for what the cost may be with help from insurance (it may be cheaper with your insurance), but the website can give you the price if you did not use any insurance.  - You can print the associated coupon and take it with   your prescription to the pharmacy.  - You may also stop by our office during regular business hours and pick up a GoodRx coupon card.  - If you need your prescription sent electronically to a different pharmacy, notify our office through [facility] MyChart or by phone at [phone] option 4.     Si Usted Necesita Algo Despus de Su Visita  Tambin puede enviarnos un mensaje a travs de MyChart. Por lo general respondemos a los mensajes de MyChart en el transcurso de 1 a 2  das hbiles.  Para renovar recetas, por favor pida a su farmacia que se ponga en contacto con nuestra oficina. Nuestro nmero de fax es el [phone].  Si tiene un asunto urgente cuando la clnica est cerrada y que no puede esperar hasta el siguiente da hbil, puede llamar/localizar a su doctor(a) al nmero que aparece a continuacin.   Por favor, tenga en cuenta que aunque hacemos todo lo posible para estar disponibles para asuntos urgentes fuera del horario de oficina, no estamos disponibles las 24 horas del da, los 7 das de la semana.   Si tiene un problema urgente y no puede comunicarse con nosotros, puede optar por buscar atencin mdica  en el consultorio de su doctor(a), en una clnica privada, en un centro de atencin urgente o en una sala de emergencias.  Si tiene una emergencia mdica, por favor llame inmediatamente al 911 o vaya a la sala de emergencias.  Nmeros de bper  - Dr. Kowalski: [phone]  - Dra. Moye: [phone]  - Dra. Stewart: [phone]  En caso de inclemencias del tiempo, por favor llame a nuestra lnea principal al [phone] para una actualizacin sobre el estado de cualquier retraso o cierre.  Consejos para la medicacin en dermatologa: Por favor, guarde las cajas en las que vienen los medicamentos de uso tpico para ayudarle a seguir las instrucciones sobre dnde y cmo usarlos. Las farmacias generalmente imprimen las instrucciones del medicamento slo en las cajas y no directamente en los tubos del medicamento.   Si su medicamento es muy caro, por favor, pngase en contacto con nuestra oficina llamando al [phone] y presione la opcin 4 o envenos un mensaje a travs de MyChart.   No podemos decirle cul ser su copago por los medicamentos por adelantado ya que esto es diferente dependiendo de la cobertura de su seguro. Sin embargo, es posible que podamos encontrar un medicamento sustituto a menor costo o llenar un formulario para que el  seguro cubra el medicamento que se considera necesario.   Si se requiere una autorizacin previa para que su compaa de seguros cubra su medicamento, por favor permtanos de 1 a 2 das hbiles para completar este proceso.  Los precios de los medicamentos varan con frecuencia dependiendo del lugar de dnde se surte la receta y alguna farmacias pueden ofrecer precios ms baratos.  El sitio web www.goodrx.com tiene cupones para medicamentos de diferentes farmacias. Los precios aqu no tienen en cuenta lo que podra costar con la ayuda del seguro (puede ser ms barato con su seguro), pero el sitio web puede darle el precio si no utiliz ningn seguro.  - Puede imprimir el cupn correspondiente y llevarlo con su receta a la farmacia.  - Tambin puede pasar por nuestra oficina durante el horario de atencin regular y recoger una tarjeta de cupones de GoodRx.  - Si necesita que su receta se enve electrnicamente a una farmacia diferente, informe a nuestra oficina a travs de MyChart de [facility]   o por telfono llamando al [phone] y presione la opcin 4.  

## 2022-04-11 ENCOUNTER — Encounter: Payer: Self-pay | Admitting: Dermatology

## 2022-04-11 ENCOUNTER — Telehealth: Payer: Self-pay

## 2022-04-11 NOTE — Progress Notes (Deleted)
Annual Wellness Visit     Patient: Abigail Acosta, Female    DOB: 25-Mar-1940, 82 y.o.   MRN: [id number] Visit Date: 04/12/2022  Today's Provider: Mikey Kirschner, [name]   No chief complaint on file.  Subjective    Abigail Acosta is a 82 y.o. female who presents today for her Annual Wellness Visit. She reports consuming a {diet types:17450} diet. {Exercise:19826} She generally feels {well/fairly well/poorly:18703}. She reports sleeping {well/fairly well/poorly:18703}. She {does/does not:200015} have additional problems to discuss today.   HPI    Medications: Outpatient Medications Prior to Visit  Medication Sig   acetaminophen (TYLENOL) 500 MG tablet Take 500-1,000 mg by mouth 2 (two) times daily as needed (back pain.).   amLODipine (NORVASC) 2.5 MG tablet TAKE 1 TABLET BY MOUTH EVERY DAY IN THE EVENING   b complex vitamins capsule Take 1 capsule by mouth daily.   Biotin 1000 MCG tablet Take 1,000 mcg by mouth daily with lunch.   cyanocobalamin 1000 MCG tablet Take 1,000 mcg by mouth daily.   lisinopril-hydrochlorothiazide (ZESTORETIC) 10-12.5 MG tablet TAKE 1/2 TABLET BY MOUTH EVERY DAY   loratadine (CLARITIN) 10 MG tablet Take 1 tablet (10 mg total) by mouth daily. (Patient taking differently: Take 10 mg by mouth daily. As needed)   montelukast (SINGULAIR) 10 MG tablet TAKE 1 TABLET BY MOUTH EVERYDAY AT BEDTIME   Multiple Vitamin (MULTIVITAMIN WITH MINERALS) TABS tablet Take 1 tablet by mouth daily with lunch. Women's Multivitamin   omeprazole (PRILOSEC) 20 MG capsule TAKE 1 CAPSULE BY MOUTH EVERY DAY   No facility-administered medications prior to visit.    No Known Allergies  Patient Care Team: Mikey Kirschner, [name] as PCP - General (Physician Assistant) Pa, [place] [facility])  Review of Systems  {Labs  Heme  Chem  Endocrine  Serology  Results Review (optional):23779}    Objective    Vitals: There were no vitals taken for this visit. {Show  previous vital signs (optional):23777}   Physical Exam ***  Most recent functional status assessment:    10/12/2021   10:22 AM  In your present state of health, do you have any difficulty performing the following activities:  Hearing? 1  Comment wears hearing aids  Vision? 0  Difficulty concentrating or making decisions? 0  Walking or climbing stairs? 0  Dressing or bathing? 0  Doing errands, shopping? 0   Most recent fall risk assessment:    10/12/2021   10:21 AM  [place] in the past year? 0  Number falls in past yr: 0  Injury with Fall? 0  Risk for fall due to : No Fall Risks  Follow up Falls evaluation completed    Most recent depression screenings:    10/12/2021   10:21 AM 04/11/2021    1:26 PM  PHQ 2/9 Scores  PHQ - 2 Score 0 0  PHQ- 9 Score 0 0   Most recent cognitive screening:     No data to display         Most recent Audit-C alcohol use screening    10/12/2021   10:21 AM  Alcohol Use Disorder Test (AUDIT)  1. How often do you have a drink containing alcohol? 0  2. How many drinks containing alcohol do you have on a typical day when you are drinking? 0  3. How often do you have six or more drinks on one occasion? 0  AUDIT-C Score 0   A score of 3 or more  in women, and 4 or more in men indicates increased risk for alcohol abuse, EXCEPT if all of the points are from question 1   No results found for any visits on 04/12/22.  Assessment & Plan     Annual wellness visit done today including the all of the following: Reviewed patient's Family Medical History Reviewed and updated list of patient's medical providers Assessment of cognitive impairment was done Assessed patient's functional ability Established a written schedule for health screening [place] Completed and Reviewed  Exercise Activities and Dietary recommendations  Goals      DIET - INCREASE WATER INTAKE     Recommend to drink at least 6-8 8oz glasses  of water per day.        Immunization History  Administered Date(s) Administered   Fluad Quad(high Dose 65+) 10/09/2019, 10/12/2021   PFIZER(Purple Top)SARS-COV-2 Vaccination 02/04/2019, 02/25/2019    Health Maintenance  Topic Date Due   DTaP/Tdap/Td (1 - Tdap) Never done   Zoster Vaccines- Shingrix (1 of 2) Never done   Pneumonia Vaccine 37+ Years old (1 of 1 - PCV) Never done   COVID-19 Vaccine (3 - Pfizer risk series) 03/25/2019   Medicare Annual Wellness (AWV)  04/12/2022   COLONOSCOPY (Pts 45-58yrs Insurance coverage will need to be confirmed)  04/12/2022 (Originally 05/30/2020)   MAMMOGRAM  06/09/2022   DEXA SCAN  06/09/2026   INFLUENZA VACCINE  Completed   HPV VACCINES  Aged Out     Discussed health benefits of physical activity, and encouraged her to engage in regular exercise appropriate for her age and condition.    ***  No follow-ups on file.     {provider attestation***:1}   Mikey Kirschner, [name]  [place] [phone] (phone) [phone] (fax)  [place]

## 2022-04-11 NOTE — Telephone Encounter (Signed)
Copied from [place] [phone]. Topic: Appointment Scheduling - Scheduling Inquiry for Clinic >> Apr 11, 2022  3:31 PM Erskine Squibb wrote: Reason for CRM: The patient spouse called in stating patient is dealing with a stomach virus and wants to reschedule her appt that is scheduled for tomorrow morning. Please assist patient further as spouse does not want to have a bill for cancelling appt so close to the scheduled time but he just said she just doesn't seem like she will be up to it.

## 2022-04-12 ENCOUNTER — Encounter: Payer: Medicare Other | Admitting: Physician Assistant

## 2022-04-12 ENCOUNTER — Ambulatory Visit: Payer: Medicare Other | Admitting: Physician Assistant

## 2022-04-12 NOTE — Telephone Encounter (Signed)
Ok noted  

## 2022-04-18 ENCOUNTER — Other Ambulatory Visit: Payer: Self-pay | Admitting: Family Medicine

## 2022-04-18 DIAGNOSIS — J301 Allergic rhinitis due to pollen: Secondary | ICD-10-CM

## 2022-04-18 DIAGNOSIS — N1831 Chronic kidney disease, stage 3a: Secondary | ICD-10-CM

## 2022-04-18 NOTE — Telephone Encounter (Signed)
Requested Prescriptions  Pending Prescriptions Disp Refills   montelukast (SINGULAIR) 10 MG tablet [Pharmacy Med Name: MONTELUKAST SOD 10 MG TABLET] 90 tablet 0    Sig: TAKE 1 TABLET BY MOUTH EVERYDAY AT BEDTIME     Pulmonology:  Leukotriene Inhibitors Passed - 04/18/2022  2:30 AM      Passed - Valid encounter within last 12 months    Recent Outpatient Visits           3 weeks ago Allergic conjunctivitis of both eyes   [place] Mikey Kirschner, [name]   6 months ago Elevated blood pressure reading in office with white coat syndrome, with diagnosis of hypertension   [place] Mikey Kirschner, [name]   1 year ago Essential hypertension   [place], Donald E, MD   1 year ago Colon cancer screening   [place], Donald E, MD   2 years ago Essential hypertension   [place], Clearnce Sorrel, [place]       Future Appointments             In 11 months Ralene Bathe, MD [place]

## 2022-04-23 NOTE — Progress Notes (Unsigned)
Abigail Acosta,[name],acting as a Neurosurgeon for [employer], [name].,have documented all relevant documentation on the behalf of Abigail Ferguson, [name],as directed by  Abigail Ferguson, [name] while in the presence of Abigail Ferguson, [name].   Annual Wellness Visit     Patient: Abigail Acosta, Female    DOB: [birth year], 82 y.o.   MRN: [id number] Visit Date: 04/24/2022  Today's Provider: [name], [name]   Cc. awv  Subjective    [name] is a 82 y.o. female who presents today for her Annual Wellness Visit. She reports consuming a general diet.  The patient reports taking her dog for a 40 minute walk daily to the park.  She generally feels well. She reports sleeping well. She does not have additional problems to discuss today.   HPI  Medications: Outpatient Medications Prior to Visit  Medication Sig   acetaminophen (TYLENOL) 500 MG tablet Take 500-1,000 mg by mouth 2 (two) times daily as needed (back pain.).   amLODipine (NORVASC) 2.5 MG tablet TAKE 1 TABLET BY MOUTH EVERY DAY IN THE EVENING   b complex vitamins capsule Take 1 capsule by mouth daily.   Biotin 1000 MCG tablet Take 1,000 mcg by mouth daily with lunch.   cyanocobalamin 1000 MCG tablet Take 1,000 mcg by mouth daily.   lisinopril-hydrochlorothiazide (ZESTORETIC) 10-12.5 MG tablet TAKE 1/2 TABLET BY MOUTH EVERY DAY   loratadine (CLARITIN) 10 MG tablet Take 1 tablet (10 mg total) by mouth daily. (Patient taking differently: Take 10 mg by mouth daily. As needed)   montelukast (SINGULAIR) 10 MG tablet TAKE 1 TABLET BY MOUTH EVERYDAY AT BEDTIME   Multiple Vitamin (MULTIVITAMIN WITH MINERALS) TABS tablet Take 1 tablet by mouth daily with lunch. Women's Multivitamin   omeprazole (PRILOSEC) 20 MG capsule TAKE 1 CAPSULE BY MOUTH EVERY DAY   No facility-administered medications prior to visit.    No Known Allergies  Patient Care Team: Abigail Ferguson, [name] as PCP - General (Physician Assistant) Pa, [facility]  (Optometry)  Review of Systems  Constitutional:  Negative for fatigue and fever.  Respiratory:  Negative for cough and shortness of breath.   Cardiovascular:  Negative for chest pain and leg swelling.  Gastrointestinal:  Negative for abdominal pain.  Neurological:  Negative for dizziness and headaches.       Objective    Vitals: BP (!) 139/51 (BP Location: Left Arm, Patient Position: Sitting, Cuff Size: Normal)   Pulse 68   Ht 5\' 1"  (1.549 m)   Wt 131 lb 11.2 oz (59.7 kg)   SpO2 100%   BMI 24.88 kg/m     Physical Exam Constitutional:      General: She is awake.     Appearance: She is well-developed. She is not ill-appearing.  HENT:     Head: Normocephalic.     Right Ear: Tympanic membrane normal.     Left Ear: Tympanic membrane normal.     Nose: Nose normal. No congestion or rhinorrhea.     Mouth/Throat:     Pharynx: No oropharyngeal exudate or posterior oropharyngeal erythema.  Eyes:     Conjunctiva/sclera: Conjunctivae normal.     Pupils: Pupils are equal, round, and reactive to light.  Neck:     Thyroid: No thyroid mass or thyromegaly.  Cardiovascular:     Rate and Rhythm: Normal rate and regular rhythm.     Heart sounds: Normal heart sounds.  Pulmonary:     Effort: Pulmonary effort is normal.     Breath sounds:  Normal breath sounds.  Abdominal:     Palpations: Abdomen is soft.     Tenderness: There is no abdominal tenderness.  Musculoskeletal:     Right lower leg: No swelling. No edema.     Left lower leg: No swelling. No edema.  Lymphadenopathy:     Cervical: No cervical adenopathy.  Skin:    General: Skin is warm.  Neurological:     Mental Status: She is alert and oriented to person, place, and time.  Psychiatric:        Attention and Perception: Attention normal.        Mood and Affect: Mood normal.        Speech: Speech normal.        Behavior: Behavior normal. Behavior is cooperative.     Most recent functional status assessment:    04/24/2022     9:24 AM  In your present state of health, do you have any difficulty performing the following activities:  Hearing? 1  Vision? 0  Difficulty concentrating or making decisions? 0  Walking or climbing stairs? 0  Dressing or bathing? 0  Doing errands, shopping? 0   Most recent fall risk assessment:    04/24/2022    9:24 AM  Fall Risk   Falls in the past year? 0  Number falls in past yr: 0  Risk for fall due to : No Fall Risks  Follow up Falls evaluation completed    Most recent depression screenings:    04/24/2022    9:24 AM 10/12/2021   10:21 AM  PHQ 2/9 Scores  PHQ - 2 Score 0 0  PHQ- 9 Score 0 0   Most recent cognitive screening:     No data to display         Most recent Audit-C alcohol use screening    04/24/2022    9:24 AM  Alcohol Use Disorder Test (AUDIT)  1. How often do you have a drink containing alcohol? 0  2. How many drinks containing alcohol do you have on a typical day when you are drinking? 0  3. How often do you have six or more drinks on one occasion? 0  AUDIT-C Score 0   A score of 3 or more in women, and 4 or more in men indicates increased risk for alcohol abuse, EXCEPT if all of the points are from question 1   No results found for any visits on 04/24/22.  Assessment & Plan     Annual wellness visit done today including the all of the following: Reviewed patient's Family Medical History Reviewed and updated list of patient's medical providers Assessment of cognitive impairment was done Assessed patient's functional ability Established a written schedule for health screening services Health Risk Assessent Completed and Reviewed  Exercise Activities and Dietary recommendations --balanced diet high in fiber and protein, low in sugars, carbs, fats. --physical activity/exercise 30 minutes 3-5 times a week    Immunization History  Administered Date(s) Administered   Fluad Quad(high Dose 65+) 10/09/2019, 10/12/2021   PFIZER(Purple  Top)SARS-COV-2 Vaccination 02/04/2019, 02/25/2019   PNEUMOCOCCAL CONJUGATE-20 04/24/2022    Health Maintenance  Topic Date Due   DTaP/Tdap/Td (1 - Tdap) Never done   Zoster Vaccines- Shingrix (1 of 2) Never done   COVID-19 Vaccine (3 - Pfizer risk series) 03/25/2019   MAMMOGRAM  06/09/2022   INFLUENZA VACCINE  08/16/2022   Medicare Annual Wellness (AWV)  04/24/2023   DEXA SCAN  06/09/2026   Pneumonia Vaccine 65+ Years  old  Completed   HPV VACCINES  Aged Out   COLONOSCOPY (Pts 45-43yrs Insurance coverage will need to be confirmed)  Discontinued     Discussed health benefits of physical activity, and encouraged her to engage in regular exercise appropriate for her age and condition.    Problem List Items Addressed This Visit       Cardiovascular and Mediastinum   Elevated blood pressure reading in office with white coat syndrome, with diagnosis of hypertension    Well controlled in office. Managed with amlodipine 2.5 mg and licinopril 10/12.5 mg 1/2 tablet PRN if elevated bp Unable to change pt self management  Will continue to monitor in office. Ordered cmp F/u 6 mo        Genitourinary   CKD (chronic kidney disease) stage 3, GFR 30-59 ml/min    Has been stable. 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 Discussed continuing or  stopping screening given her age.  Pt denies rectal bleeding, change in bm, pain. She would prefer to not continue. Advised if any symptoms appear would recommend screening.  Return in about 6 months (around 10/24/2022) for chronic conditions.     Abigail Acosta, Abigail Ferguson, [name] have reviewed all documentation for this visit. The documentation on  04/24/22  for the exam, diagnosis, procedures, and orders are all accurate and complete.  Abigail Ferguson, [name] [facility] [address] [place], [place], [address] Office: [phone] Fax: [phone]   [facility]

## 2022-04-24 ENCOUNTER — Ambulatory Visit (INDEPENDENT_AMBULATORY_CARE_PROVIDER_SITE_OTHER): Payer: Medicare Other | Admitting: Physician Assistant

## 2022-04-24 ENCOUNTER — Encounter: Payer: Self-pay | Admitting: Physician Assistant

## 2022-04-24 VITALS — BP 139/51 | HR 68 | Ht 61.0 in | Wt 131.7 lb

## 2022-04-24 DIAGNOSIS — N1832 Chronic kidney disease, stage 3b: Secondary | ICD-10-CM

## 2022-04-24 DIAGNOSIS — E7800 Pure hypercholesterolemia, unspecified: Secondary | ICD-10-CM

## 2022-04-24 DIAGNOSIS — Z0000 Encounter for general adult medical examination without abnormal findings: Secondary | ICD-10-CM | POA: Diagnosis not present

## 2022-04-24 DIAGNOSIS — I10 Essential (primary) hypertension: Secondary | ICD-10-CM

## 2022-04-24 DIAGNOSIS — Z23 Encounter for immunization: Secondary | ICD-10-CM

## 2022-04-24 DIAGNOSIS — R7989 Other specified abnormal findings of blood chemistry: Secondary | ICD-10-CM

## 2022-04-24 DIAGNOSIS — Z1231 Encounter for screening mammogram for malignant neoplasm of breast: Secondary | ICD-10-CM

## 2022-04-24 NOTE — Assessment & Plan Note (Signed)
Well controlled in office. Managed with amlodipine 2.5 mg and licinopril 10/12.5 mg 1/2 tablet PRN if elevated bp Unable to change pt self management  Will continue to monitor in office. Ordered cmp F/u 6 mo

## 2022-04-24 NOTE — Assessment & Plan Note (Signed)
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

## 2022-04-24 NOTE — Assessment & Plan Note (Signed)
Has been stable. Advised if any drop in GFR below 30 that is consistent would ref to nephro

## 2022-04-25 ENCOUNTER — Other Ambulatory Visit: Payer: Self-pay | Admitting: Family Medicine

## 2022-04-25 ENCOUNTER — Encounter: Payer: Self-pay | Admitting: Physician Assistant

## 2022-04-25 DIAGNOSIS — J301 Allergic rhinitis due to pollen: Secondary | ICD-10-CM

## 2022-04-25 LAB — CBC WITH DIFFERENTIAL/PLATELET
Basophils Absolute: 0.1 10*3/uL (ref 0.0–0.2)
Basos: 1 %
EOS (ABSOLUTE): 0.3 10*3/uL (ref 0.0–0.4)
Eos: 7 %
Hematocrit: 42.3 % (ref 34.0–46.6)
Hemoglobin: 14 g/dL (ref 11.1–15.9)
Immature Grans (Abs): 0 10*3/uL (ref 0.0–0.1)
Immature Granulocytes: 1 %
Lymphocytes Absolute: 1.6 10*3/uL (ref 0.7–3.1)
Lymphs: 38 %
MCH: 31.3 pg (ref 26.6–33.0)
MCHC: 33.1 g/dL (ref 31.5–35.7)
MCV: 94 fL (ref 79–97)
Monocytes Absolute: 0.4 10*3/uL (ref 0.1–0.9)
Monocytes: 10 %
Neutrophils Absolute: 1.8 10*3/uL (ref 1.4–7.0)
Neutrophils: 43 %
Platelets: 193 10*3/uL (ref 150–450)
RBC: 4.48 x10E6/uL (ref 3.77–5.28)
RDW: 11.9 % (ref 11.7–15.4)
WBC: 4.1 10*3/uL (ref 3.4–10.8)

## 2022-04-25 LAB — LIPID PANEL
Chol/HDL Ratio: 3.6 ratio (ref 0.0–4.4)
Cholesterol, Total: 225 mg/dL — ABNORMAL HIGH (ref 100–199)
HDL: 62 mg/dL (ref >39)
LDL Chol Calc (NIH): 141 mg/dL — ABNORMAL HIGH (ref 0–99)
Triglycerides: 122 mg/dL (ref 0–149)
VLDL Cholesterol Cal: 22 mg/dL (ref 5–40)

## 2022-04-25 LAB — COMPREHENSIVE METABOLIC PANEL
ALT: 18 IU/L (ref 0–32)
AST: 24 IU/L (ref 0–40)
Albumin/Globulin Ratio: 2.1 (ref 1.2–2.2)
Albumin: 4.8 g/dL (ref 3.8–4.8)
Alkaline Phosphatase: 51 IU/L (ref 44–121)
BUN/Creatinine Ratio: 20 (ref 12–28)
BUN: 24 mg/dL (ref 8–27)
Bilirubin Total: 0.3 mg/dL (ref 0.0–1.2)
CO2: 22 mmol/L (ref 20–29)
Calcium: 10.5 mg/dL — ABNORMAL HIGH (ref 8.7–10.3)
Chloride: 107 mmol/L — ABNORMAL HIGH (ref 96–106)
Creatinine, Ser: 1.22 mg/dL — ABNORMAL HIGH (ref 0.57–1.00)
Globulin, Total: 2.3 g/dL (ref 1.5–4.5)
Glucose: 91 mg/dL (ref 70–99)
Potassium: 4.9 mmol/L (ref 3.5–5.2)
Sodium: 144 mmol/L (ref 134–144)
Total Protein: 7.1 g/dL (ref 6.0–8.5)
eGFR: 45 mL/min/{1.73_m2} — ABNORMAL LOW (ref >59)

## 2022-04-25 LAB — TSH+FREE T4
Free T4: 1.13 ng/dL (ref 0.82–1.77)
TSH: 3.97 u[IU]/mL (ref 0.450–4.500)

## 2022-04-26 NOTE — Telephone Encounter (Signed)
Requested Prescriptions  Pending Prescriptions Disp Refills   loratadine (CLARITIN) 10 MG tablet [Pharmacy Med Name: LORATADINE 10 MG TABLET] 90 tablet 1    Sig: TAKE 1 TABLET BY MOUTH EVERY DAY     Ear, Nose, and Throat:  Antihistamines 2 Failed - 04/25/2022 10:41 AM      Failed - Cr in normal range and within 360 days    Creatinine, Ser  Date Value Ref Range Status  04/24/2022 1.22 (H) 0.57 - 1.00 mg/dL Final         Passed - Valid encounter within last 12 months    Recent Outpatient Visits           1 month ago Allergic conjunctivitis of both eyes   [name], [name]   6 months ago Elevated blood pressure reading in office with white coat syndrome, with diagnosis of hypertension   [name], [name]   1 year ago Essential hypertension   [name], MD   1 year ago Colon cancer screening   [name], MD   2 years ago Essential hypertension   [name], [name], [name]       Future Appointments             In 6 months Ok Edwards, Lou [facility], [facility]   In 11 months Deirdre Evener, MD [place] [facility]

## 2022-05-23 ENCOUNTER — Other Ambulatory Visit: Payer: Self-pay | Admitting: Physician Assistant

## 2022-05-23 DIAGNOSIS — I10 Essential (primary) hypertension: Secondary | ICD-10-CM

## 2022-06-23 ENCOUNTER — Other Ambulatory Visit: Payer: Self-pay | Admitting: Family Medicine

## 2022-06-23 DIAGNOSIS — I10 Essential (primary) hypertension: Secondary | ICD-10-CM

## 2022-06-23 DIAGNOSIS — N1831 Chronic kidney disease, stage 3a: Secondary | ICD-10-CM

## 2022-06-25 NOTE — Telephone Encounter (Signed)
Requested Prescriptions  Pending Prescriptions Disp Refills   amLODipine (NORVASC) 2.5 MG tablet [Pharmacy Med Name: AMLODIPINE BESYLATE 2.5 MG TAB] 90 tablet 0    Sig: TAKE 1 TABLET BY MOUTH EVERY DAY IN THE EVENING     Cardiovascular: Calcium Channel Blockers 2 Passed - 06/23/2022  8:41 AM      Passed - Last BP in normal range    BP Readings from Last 1 Encounters:  04/24/22 (!) 139/51         Passed - Last Heart Rate in normal range    Pulse Readings from Last 1 Encounters:  04/24/22 68         Passed - Valid encounter within last 6 months    Recent Outpatient Visits           3 months ago Allergic conjunctivitis of both eyes   [facility] [place], [place], [name]   8 months ago Elevated blood pressure reading in office with white coat syndrome, with diagnosis of hypertension   [facility] [name], [name]   1 year ago Essential hypertension   [place] [facility] Malva Limes, MD   1 year ago Colon cancer screening   [facility] Malva Limes, MD   2 years ago Essential hypertension   [facility] [place], [name], [name]       Future Appointments             In 4 months Ok Edwards, [place] [facility], [facility]   In 9 months Deirdre Evener, MD [facility]

## 2022-07-02 ENCOUNTER — Other Ambulatory Visit: Payer: Self-pay | Admitting: Family Medicine

## 2022-07-02 DIAGNOSIS — K219 Gastro-esophageal reflux disease without esophagitis: Secondary | ICD-10-CM

## 2022-07-23 ENCOUNTER — Ambulatory Visit
Admission: RE | Admit: 2022-07-23 | Discharge: 2022-07-23 | Disposition: A | Discharge status: Home / Self Care | Payer: Medicare Other | Source: Ambulatory Visit | Attending: Physician Assistant | Admitting: Physician Assistant

## 2022-07-23 DIAGNOSIS — Z1231 Encounter for screening mammogram for malignant neoplasm of breast: Secondary | ICD-10-CM | POA: Insufficient documentation

## 2022-09-26 ENCOUNTER — Other Ambulatory Visit: Payer: Self-pay | Admitting: Family Medicine

## 2022-09-26 DIAGNOSIS — N1831 Chronic kidney disease, stage 3a: Secondary | ICD-10-CM

## 2022-09-26 DIAGNOSIS — I10 Essential (primary) hypertension: Secondary | ICD-10-CM

## 2022-09-29 ENCOUNTER — Other Ambulatory Visit: Payer: Self-pay | Admitting: Physician Assistant

## 2022-09-29 DIAGNOSIS — K219 Gastro-esophageal reflux disease without esophagitis: Secondary | ICD-10-CM

## 2022-10-01 NOTE — Telephone Encounter (Signed)
Requested Prescriptions  Pending Prescriptions Disp Refills   omeprazole (PRILOSEC) 20 MG capsule [Pharmacy Med Name: OMEPRAZOLE DR 20 MG CAPSULE] 90 capsule 0    Sig: TAKE 1 CAPSULE BY MOUTH EVERY DAY     Gastroenterology: Proton Pump Inhibitors Passed - 09/29/2022  8:46 AM      Passed - Valid encounter within last 12 months    Recent Outpatient Visits           6 months ago Allergic conjunctivitis of both eyes   [place] [facility] [name], [name]   11 months ago Elevated blood pressure reading in office with white coat syndrome, with diagnosis of hypertension   [facility] [name], [name]   1 year ago Essential hypertension   [place] [facility] Malva Limes, MD   2 years ago Colon cancer screening   [facility] Malva Limes, MD   2 years ago Essential hypertension   [facility] [place], [name], [name]       Future Appointments             In 3 weeks P[place], Monico Blitz, DO [place] [facility], [facility]   In 2 months [place], Cristal Deer, MD [facility] [place]   In 6 months Deirdre Evener, MD [place] [facility]

## 2022-10-12 ENCOUNTER — Other Ambulatory Visit: Payer: Self-pay | Admitting: Family Medicine

## 2022-10-12 DIAGNOSIS — N1831 Chronic kidney disease, stage 3a: Secondary | ICD-10-CM

## 2022-10-12 DIAGNOSIS — J301 Allergic rhinitis due to pollen: Secondary | ICD-10-CM

## 2022-10-24 ENCOUNTER — Ambulatory Visit: Payer: Medicare Other | Admitting: Physician Assistant

## 2022-10-24 ENCOUNTER — Telehealth: Payer: Medicare Other | Admitting: Family Medicine

## 2022-10-24 NOTE — Progress Notes (Deleted)
Established patient visit   Patient: Abigail Acosta   DOB: 04/30/1940   82 y.o. Female  MRN: [id number] Visit Date: 10/24/2022  Today's healthcare provider: Sherlyn Hay, DO   No chief complaint on file.  Subjective    HPI       ***  {History (Optional):23778}  Medications: Outpatient Medications Prior to Visit  Medication Sig   acetaminophen (TYLENOL) 500 MG tablet Take 500-1,000 mg by mouth 2 (two) times daily as needed (back pain.).   amLODipine (NORVASC) 2.5 MG tablet TAKE 1 TABLET BY MOUTH EVERY DAY IN THE EVENING   b complex vitamins capsule Take 1 capsule by mouth daily.   Biotin 1000 MCG tablet Take 1,000 mcg by mouth daily with lunch.   cyanocobalamin 1000 MCG tablet Take 1,000 mcg by mouth daily.   lisinopril-hydrochlorothiazide (ZESTORETIC) 10-12.5 MG tablet TAKE 1/2 TABLET BY MOUTH DAILY   loratadine (CLARITIN) 10 MG tablet TAKE 1 TABLET BY MOUTH EVERY DAY   montelukast (SINGULAIR) 10 MG tablet TAKE 1 TABLET BY MOUTH EVERYDAY AT BEDTIME   Multiple Vitamin (MULTIVITAMIN WITH MINERALS) TABS tablet Take 1 tablet by mouth daily with lunch. Women's Multivitamin   omeprazole (PRILOSEC) 20 MG capsule TAKE 1 CAPSULE BY MOUTH EVERY DAY   No facility-administered medications prior to visit.    Review of Systems  ***  {Insert previous labs (optional):23779} {See past labs  Heme  Chem  Endocrine  Serology  Results Review (optional):1}   Objective    There were no vitals taken for this visit. {Insert last BP/Wt (optional):23777}{See vitals history (optional):1}   Physical Exam  ***  No results found for any visits on 10/24/22.  Assessment & Plan    There are no diagnoses linked to this encounter.   ***  No follow-ups on file.      I discussed the assessment and treatment plan with the patient  The patient was provided an opportunity to ask questions and all were answered. The patient agreed with the plan and demonstrated an understanding  of the instructions.   The patient was advised to call back or seek an in-person evaluation if the symptoms worsen or if the condition fails to improve as anticipated.    Sherlyn Hay, DO  [facility] [phone] (phone) [phone] (fax)  [facility]

## 2022-11-02 ENCOUNTER — Ambulatory Visit (INDEPENDENT_AMBULATORY_CARE_PROVIDER_SITE_OTHER): Payer: Medicare Other | Admitting: Family Medicine

## 2022-11-02 ENCOUNTER — Encounter: Payer: Self-pay | Admitting: Family Medicine

## 2022-11-02 VITALS — BP 123/75 | HR 91 | Temp 98.0°F | Resp 16 | Ht 61.0 in | Wt 132.6 lb

## 2022-11-02 DIAGNOSIS — M85851 Other specified disorders of bone density and structure, right thigh: Secondary | ICD-10-CM

## 2022-11-02 DIAGNOSIS — I10 Essential (primary) hypertension: Secondary | ICD-10-CM | POA: Diagnosis not present

## 2022-11-02 DIAGNOSIS — M85852 Other specified disorders of bone density and structure, left thigh: Secondary | ICD-10-CM

## 2022-11-02 DIAGNOSIS — J3089 Other allergic rhinitis: Secondary | ICD-10-CM

## 2022-11-02 DIAGNOSIS — N1832 Chronic kidney disease, stage 3b: Secondary | ICD-10-CM | POA: Diagnosis not present

## 2022-11-02 DIAGNOSIS — Z0000 Encounter for general adult medical examination without abnormal findings: Secondary | ICD-10-CM | POA: Insufficient documentation

## 2022-11-02 DIAGNOSIS — Z79899 Other long term (current) drug therapy: Secondary | ICD-10-CM

## 2022-11-02 DIAGNOSIS — J302 Other seasonal allergic rhinitis: Secondary | ICD-10-CM

## 2022-11-02 DIAGNOSIS — E7800 Pure hypercholesterolemia, unspecified: Secondary | ICD-10-CM | POA: Diagnosis not present

## 2022-11-02 NOTE — Assessment & Plan Note (Addendum)
Will recheck levels today. At this point, patient does not think she would want dialysis if it did become necessary.  Will revisit if indicated. Discussed option of Farxiga if her uACR is significantly elevated.  Will revisit if indicated.

## 2022-11-02 NOTE — Assessment & Plan Note (Signed)
Takes supplemental calcium. Does not take supplemental vitamin D. Plan for repeat DEXA in May 2025.

## 2022-11-02 NOTE — Patient Instructions (Addendum)
Please ask [facility] to send the records of the vaccines you received there.  Consider switching to cetirizine (zyrtec) at start of December to try to head off sinus infection (stop loratadine (Claritin) while taking zyrtec).

## 2022-11-02 NOTE — Assessment & Plan Note (Signed)
Physical exam overall unremarkable except as noted above. Routine lab work ordered as noted.

## 2022-11-02 NOTE — Assessment & Plan Note (Signed)
Not on statin, per patient preference ASCVD calculated using 75-year-age is 19.9%, which is actually fairly low for her age

## 2022-11-02 NOTE — Assessment & Plan Note (Signed)
Patient on chronic PPI, lending increased risk due to low B12 levels. Patient also does supplement with vitamin B12 1000 mcg daily. Will recheck level today

## 2022-11-02 NOTE — Assessment & Plan Note (Signed)
Calcium 10.5 on previous CMP 04/24/2022.   Will recheck calcium level today.   If still elevated, will have patient stop her calcium supplementation and recheck in 4 weeks.

## 2022-11-02 NOTE — Assessment & Plan Note (Signed)
Discussed switching to cetirizine (zyrtec) at start of December to try to head off sinus infections (stop loratadine (Claritin) while taking zyrtec). Otherwise recommended patient continue Claritin the rest of the year, as it seems to work well in that timeframe.

## 2022-11-02 NOTE — Assessment & Plan Note (Addendum)
BP WNL today. Continue amlodipine 2.5 mg daily and lisinopril-hydrochlorothiazide 10-12.5 mg daily. No changes today.

## 2022-11-02 NOTE — Progress Notes (Signed)
Established patient visit   Patient: Abigail Acosta   DOB: 05/16/1940   82 y.o. Female  MRN: [id number] Visit Date: 11/02/2022  Today's healthcare provider: Sherlyn Hay, DO   Chief Complaint  Patient presents with   Follow-up    6 mo f/u    Subjective    HPI Last AWV: 03/12/2022  No concerns today Doing well Stopped cholesterol medication due to it making her feel terrible.  She would prefer not to take any additional medications for possible.  Doesn't eat red meat; eats some "junk" but eats predominantly vegetables Drinks a lot of coffee (a full pot or more). Does drink a bottle of propel every night Does exercise regularly/is fairly active overall.  Takes supplemental calcium  Has chronic sinus problems; will come in for sick visits for those.  - usually has problems in December   Had shingles vaccine at [facility] earlier this year. Thinks she had Tdap 7 years ago when she accidentally cut her wrist.     Medications: Outpatient Medications Prior to Visit  Medication Sig   acetaminophen (TYLENOL) 500 MG tablet Take 500-1,000 mg by mouth 2 (two) times daily as needed (back pain.).   amLODipine (NORVASC) 2.5 MG tablet TAKE 1 TABLET BY MOUTH EVERY DAY IN THE EVENING   b complex vitamins capsule Take 1 capsule by mouth daily.   Biotin 1000 MCG tablet Take 1,000 mcg by mouth daily with lunch.   cyanocobalamin 1000 MCG tablet Take 1,000 mcg by mouth daily.   lisinopril-hydrochlorothiazide (ZESTORETIC) 10-12.5 MG tablet TAKE 1/2 TABLET BY MOUTH DAILY   loratadine (CLARITIN) 10 MG tablet TAKE 1 TABLET BY MOUTH EVERY DAY   montelukast (SINGULAIR) 10 MG tablet TAKE 1 TABLET BY MOUTH EVERYDAY AT BEDTIME   Multiple Vitamin (MULTIVITAMIN WITH MINERALS) TABS tablet Take 1 tablet by mouth daily with lunch. Women's Multivitamin   omeprazole (PRILOSEC) 20 MG capsule TAKE 1 CAPSULE BY MOUTH EVERY DAY   No facility-administered medications prior to visit.    Review of  Systems  Constitutional:  Negative for appetite change, chills, fatigue and fever.  HENT:  Negative for congestion, ear pain, rhinorrhea, sinus pressure, sinus pain and trouble swallowing.   Eyes:  Negative for visual disturbance.  Respiratory:  Negative for chest tightness and shortness of breath.   Cardiovascular:  Negative for chest pain and palpitations.  Gastrointestinal:  Negative for abdominal pain, constipation, diarrhea, nausea and vomiting.  Musculoskeletal:  Positive for arthralgias (chronic, follows with Ortho) and back pain (Chronic, follows with Ortho).  Skin:  Negative for rash.  Neurological:  Negative for dizziness, weakness and numbness.        Objective    BP 123/75 (BP Location: Right Arm, Patient Position: Sitting, Cuff Size: Normal)   Pulse 91   Temp 98 F (36.7 C)   Resp 16   Ht 5\' 1"  (1.549 m)   Wt 132 lb 9.6 oz (60.1 kg)   SpO2 100%   BMI 25.05 kg/m     Physical Exam Vitals and nursing note reviewed.  Constitutional:      General: She is awake.     Appearance: Normal appearance.  HENT:     Head: Normocephalic and atraumatic.     Right Ear: Tympanic membrane, ear canal and external ear normal.     Left Ear: Tympanic membrane, ear canal and external ear normal.     Nose: Nose normal.     Mouth/Throat:     Mouth: Mucous  membranes are moist.     Pharynx: Oropharynx is clear. No oropharyngeal exudate or posterior oropharyngeal erythema.  Eyes:     General: No scleral icterus.    Extraocular Movements: Extraocular movements intact.     Conjunctiva/sclera: Conjunctivae normal.     Pupils: Pupils are equal, round, and reactive to light.  Neck:     Thyroid: No thyromegaly or thyroid tenderness.  Cardiovascular:     Rate and Rhythm: Normal rate and regular rhythm.     Pulses: Normal pulses.     Heart sounds: Normal heart sounds.  Pulmonary:     Effort: Pulmonary effort is normal. No tachypnea, bradypnea or respiratory distress.     Breath sounds:  Normal breath sounds. No stridor. No wheezing, rhonchi or rales.  Abdominal:     General: Bowel sounds are normal. There is no distension.     Palpations: Abdomen is soft. There is no mass.     Tenderness: There is no abdominal tenderness. There is no guarding.     Hernia: No hernia is present.  Musculoskeletal:     Cervical back: Normal range of motion and neck supple.     Right lower leg: No edema.     Left lower leg: No edema.  Lymphadenopathy:     Cervical: No cervical adenopathy.  Skin:    General: Skin is warm and dry.  Neurological:     Mental Status: She is alert and oriented to person, place, and time. Mental status is at baseline.  Psychiatric:        Mood and Affect: Mood normal.        Behavior: Behavior normal.      No results found for any visits on 11/02/22.  Assessment & Plan    Annual physical exam Assessment & Plan: Physical exam overall unremarkable except as noted above. Routine lab work ordered as noted.   Elevated blood pressure reading in office with white coat syndrome, with diagnosis of hypertension Assessment & Plan: BP WNL today. Continue amlodipine 2.5 mg daily and lisinopril-hydrochlorothiazide 10-12.5 mg daily. No changes today.  Orders: -     Microalbumin / creatinine urine ratio  Stage 3b chronic kidney disease (HCC) Assessment & Plan: Will recheck levels today. At this point, patient does not think she would want dialysis if it did become necessary.  Will revisit if indicated. Discussed option of Farxiga if her uACR is significantly elevated.  Will revisit if indicated.  Orders: -     Comprehensive metabolic panel -     Microalbumin / creatinine urine ratio  Hypercholesteremia Assessment & Plan: Not on statin, per patient preference ASCVD calculated using 75-year-age is 19.9%, which is actually fairly low for her age  Orders: -     Lipid panel  Hypercalcemia Assessment & Plan: Calcium 10.5 on previous CMP 04/24/2022.   Will  recheck calcium level today.   If still elevated, will have patient stop her calcium supplementation and recheck in 4 weeks.  Orders: -     VITAMIN D 25 Hydroxy (Vit-D Deficiency, Fractures) -     Comprehensive metabolic panel  High risk medication use Assessment & Plan: Patient on chronic PPI, lending increased risk due to low B12 levels. Patient also does supplement with vitamin B12 1000 mcg daily. Will recheck level today  Orders: -     Vitamin B12  Perennial allergic rhinitis with seasonal variation Assessment & Plan: Discussed switching to cetirizine (zyrtec) at start of December to try to head off sinus  infections (stop loratadine (Claritin) while taking zyrtec). Otherwise recommended patient continue Claritin the rest of the year, as it seems to work well in that timeframe.   Osteopenia of both hips Assessment & Plan: Takes supplemental calcium. Does not take supplemental vitamin D. Plan for repeat DEXA in May 2025.    Return in about 6 months (around 05/03/2023).      I discussed the assessment and treatment plan with the patient  The patient was provided an opportunity to ask questions and all were answered. The patient agreed with the plan and demonstrated an understanding of the instructions.   The patient was advised to call back or seek an in-person evaluation if the symptoms worsen or if the condition fails to improve as anticipated.    Sherlyn Hay, DO  [facility] [phone] (phone) [phone] (fax)  [facility]

## 2022-11-14 ENCOUNTER — Other Ambulatory Visit: Payer: Self-pay | Admitting: Physician Assistant

## 2022-11-14 DIAGNOSIS — I10 Essential (primary) hypertension: Secondary | ICD-10-CM

## 2022-11-14 NOTE — Telephone Encounter (Signed)
Requested medication (s) are due for refill today: yes   Requested medication (s) are on the active medication list: yes   Last refill:  05/23/22 #45 1 refills  Future visit scheduled: yes in 5 months   Notes to clinic:  last ordered by L. Drubel, PA. Do you want to refill Rx?     Requested Prescriptions  Pending Prescriptions Disp Refills   lisinopril-hydrochlorothiazide (ZESTORETIC) 10-12.5 MG tablet [Pharmacy Med Name: LISINOPRIL-HCTZ 10-12.5 MG TAB] 45 tablet 1    Sig: TAKE 1/2 TABLET BY MOUTH DAILY     Cardiovascular:  ACEI + Diuretic Combos Failed - 11/14/2022  1:32 AM      Failed - Na in normal range and within 180 days    Sodium  Date Value Ref Range Status  04/24/2022 144 134 - 144 mmol/L Final         Failed - K in normal range and within 180 days    Potassium  Date Value Ref Range Status  04/24/2022 4.9 3.5 - 5.2 mmol/L Final         Failed - Cr in normal range and within 180 days    Creatinine, Ser  Date Value Ref Range Status  04/24/2022 1.22 (H) 0.57 - 1.00 mg/dL Final         Failed - eGFR is 30 or above and within 180 days    GFR calc Af Amer  Date Value Ref Range Status  10/09/2019 61 >59 mL/min/1.73 Final    Comment:    **[facility] currently reports eGFR in compliance with the current**   recommendations of the [employer]. [facility] will   update reporting as new guidelines are published from the NKF-ASN   Task force.    GFR calc non Af Amer  Date Value Ref Range Status  10/09/2019 53 (L) >59 mL/min/1.73 Final   eGFR  Date Value Ref Range Status  04/24/2022 45 (L) >59 mL/min/1.73 Final         Passed - Patient is not pregnant      Passed - Last BP in normal range    BP Readings from Last 1 Encounters:  11/02/22 123/75         Passed - Valid encounter within last 6 months    Recent Outpatient Visits           1 week ago Annual physical exam   [facility], Monico Blitz, DO   7 months ago  Allergic conjunctivitis of both eyes   [facility] [name], [name]   1 year ago Elevated blood pressure reading in office with white coat syndrome, with diagnosis of hypertension   [facility] [name], [name]   2 years ago Essential hypertension   [facility] Malva Limes, MD   2 years ago Colon cancer screening   [facility] Malva Limes, MD       Future Appointments             In 1 month End, Cristal Deer, MD [facility] [place]   In 4 months Deirdre Evener, MD [facility]   In 5 months Pardue, Monico Blitz, DO  [facility]

## 2022-12-06 LAB — LIPID PANEL
Chol/HDL Ratio: 3.7 {ratio} (ref 0.0–4.4)
Cholesterol, Total: 237 mg/dL — ABNORMAL HIGH (ref 100–199)
HDL: 64 mg/dL (ref >39)
LDL Chol Calc (NIH): 156 mg/dL — ABNORMAL HIGH (ref 0–99)
Triglycerides: 98 mg/dL (ref 0–149)
VLDL Cholesterol Cal: 17 mg/dL (ref 5–40)

## 2022-12-06 LAB — COMPREHENSIVE METABOLIC PANEL
ALT: 18 [IU]/L (ref 0–32)
AST: 29 [IU]/L (ref 0–40)
Albumin: 4.6 g/dL (ref 3.7–4.7)
Alkaline Phosphatase: 48 [IU]/L (ref 44–121)
BUN/Creatinine Ratio: 23 (ref 12–28)
BUN: 30 mg/dL — ABNORMAL HIGH (ref 8–27)
Bilirubin Total: 0.4 mg/dL (ref 0.0–1.2)
CO2: 19 mmol/L — ABNORMAL LOW (ref 20–29)
Calcium: 9.7 mg/dL (ref 8.7–10.3)
Chloride: 108 mmol/L — ABNORMAL HIGH (ref 96–106)
Creatinine, Ser: 1.33 mg/dL — ABNORMAL HIGH (ref 0.57–1.00)
Globulin, Total: 2 g/dL (ref 1.5–4.5)
Glucose: 88 mg/dL (ref 70–99)
Potassium: 4 mmol/L (ref 3.5–5.2)
Sodium: 141 mmol/L (ref 134–144)
Total Protein: 6.6 g/dL (ref 6.0–8.5)
eGFR: 40 mL/min/{1.73_m2} — ABNORMAL LOW (ref >59)

## 2022-12-06 LAB — VITAMIN B12: Vitamin B-12: >2000 pg/mL — ABNORMAL HIGH (ref 232–1245)

## 2022-12-06 LAB — MICROALBUMIN / CREATININE URINE RATIO
Creatinine, Urine: 74.5 mg/dL
Microalb/Creat Ratio: 45 mg/g{creat} — ABNORMAL HIGH (ref 0–29)
Microalbumin, Urine: 33.8 ug/mL

## 2022-12-06 LAB — VITAMIN D 25 HYDROXY (VIT D DEFICIENCY, FRACTURES): Vit D, 25-Hydroxy: 49.4 ng/mL (ref 30.0–100.0)

## 2022-12-19 ENCOUNTER — Other Ambulatory Visit: Payer: Self-pay | Admitting: Family Medicine

## 2022-12-19 DIAGNOSIS — N1831 Chronic kidney disease, stage 3a: Secondary | ICD-10-CM

## 2022-12-19 DIAGNOSIS — I10 Essential (primary) hypertension: Secondary | ICD-10-CM

## 2022-12-26 ENCOUNTER — Encounter: Payer: Self-pay | Admitting: Internal Medicine

## 2022-12-26 ENCOUNTER — Ambulatory Visit: Payer: Medicare Other | Attending: Internal Medicine | Admitting: Internal Medicine

## 2022-12-26 VITALS — BP 132/78 | HR 71 | Ht 61.5 in | Wt 129.8 lb

## 2022-12-26 DIAGNOSIS — R011 Cardiac murmur, unspecified: Secondary | ICD-10-CM | POA: Insufficient documentation

## 2022-12-26 DIAGNOSIS — I10 Essential (primary) hypertension: Secondary | ICD-10-CM | POA: Insufficient documentation

## 2022-12-26 DIAGNOSIS — M7989 Other specified soft tissue disorders: Secondary | ICD-10-CM | POA: Insufficient documentation

## 2022-12-26 DIAGNOSIS — E7800 Pure hypercholesterolemia, unspecified: Secondary | ICD-10-CM | POA: Insufficient documentation

## 2022-12-26 DIAGNOSIS — I700 Atherosclerosis of aorta: Secondary | ICD-10-CM | POA: Diagnosis present

## 2022-12-26 DIAGNOSIS — Z79899 Other long term (current) drug therapy: Secondary | ICD-10-CM | POA: Diagnosis present

## 2022-12-26 NOTE — Progress Notes (Signed)
Cardiology Office Note:  .   Date:  12/26/2022  ID:  Marcelino Scot, DOB May 04, 1940, MRN [id number] PCP: Sherlyn Hay, DO  [facility] Providers Cardiologist:  New  History of Present Illness: Abigail Acosta is a 82 y.o. female with history of hypertension, hyperlipidemia, hypothyroidism, chronic kidney disease stage IIIa, and varicose veins, who presents as a self-referral for cardiovascular risk assessment.  She feels well, denying chest pain, shortness of breath, palpitations, and lightheadedness.  She notes intermittent leg edema which is well-managed with every other day lisinopril-HCTZ.  She does not take this medication daily, because she feels like it drops her blood pressure too low if she takes it every day.  She is concerned about her cardiovascular health, as her husband recently had a "cardiovascular event."  She has never undergone any cardiac testing nor does she have a history of cardiovascular disease.  ROS: See HPI  Studies Reviewed: Marland Kitchen   EKG Interpretation Date/Time:  Wednesday December 26 2022 10:09:23 EST Ventricular Rate:  71 PR Interval:  148 QRS Duration:  80 QT Interval:  408 QTC Calculation: 443 R Axis:   36  Text Interpretation: Normal sinus rhythm Normal ECG When compared with ECG of 03-Apr-2018 00:18, No significant change was found Confirmed by Osman Calzadilla, Cristal Deer [phone]) on 12/26/2022 1:53:31 PM    Risk Assessment/Calculations:             Physical Exam:   VS:  BP 132/78 (BP Location: Right Arm, Patient Position: Sitting, Cuff Size: Normal)   Pulse 71   Ht 5' 1.5" (1.562 m)   Wt 129 lb 12.8 oz (58.9 kg)   SpO2 97%   BMI 24.13 kg/m    Wt Readings from Last 3 Encounters:  12/26/22 129 lb 12.8 oz (58.9 kg)  11/02/22 132 lb 9.6 oz (60.1 kg)  04/24/22 131 lb 11.2 oz (59.7 kg)    General:  NAD. Neck: No JVD or HJR. Lungs: Clear to auscultation bilaterally without wheezes or crackles. Heart: Regular rate and rhythm with 2/6 systolic  murmur at RUSB.  No rubs or gallops. Abdomen: Soft, nontender, nondistended. Extremities: No lower extremity edema.  Varicose veins noted bilaterally.  ASSESSMENT AND PLAN: .    Heart murmur and leg swelling: Ms. Staudacher is asymptomatic other than occasional leg edema that seems to worsen if she does not take lisinopril-HCTZ at least every other day.  She has a soft systolic murmur on examination today.  We will obtain an echocardiogram for further evaluation.  Hypertension: Blood pressure borderline elevated today but typically better at home.  Ms. Scharrer states that she does not feel well if her systolic blood pressure drops below 120.  We discussed that irregular use of her blood pressure medications may minimize their efficacy in preventing complications of hypertension.  However, we have agreed to defer changes today.  Hyperlipidemia and aortic atherosclerosis: Most recent lipid panel in November notable for total cholesterol 237, HDL 64, and LDL 156.  Ms. Brockschmidt does not have established ASCVD, though on my review of a CT of the abdomen and pelvis in 2020, small aortic atherosclerotic calcifications are evident.  I have recommended targeting LDL below at least 100.  Ms. Mckendall wishes to defer medication additions today, as she previously did not tolerate lovastatin due to myalgias and malaise.  She will work on lifestyle modifications with plans to repeat a lipid panel in 3 months.  If LDL has not improved, we will need to readdress pharmacotherapy.  Dispo: Return to clinic in 3 months, with fasting lipid panel shortly before visit.  Signed, Yvonne Kendall, MD

## 2022-12-26 NOTE — Patient Instructions (Signed)
Medication Instructions:  Your physician recommends that you continue on your current medications as directed. Please refer to the Current Medication list given to you today.   *If you need a refill on your cardiac medications before your next appointment, please call your pharmacy*   Lab Work: Your provider would like for you to return in March, 2025 to have the following labs drawn: (Lipid).   Please go to [facility] [address], [place] [address] You do not need an appointment.  They are open from 7:30 am-4 pm.  Lunch from 1:00 pm- 2:00 pm You will need to be fasting.   Testing/Procedures: Your physician has requested that you have an echocardiogram. Echocardiography is a painless test that uses sound waves to create images of your heart. It provides your doctor with information about the size and shape of your heart and how well your heart's chambers and valves are working.   You may receive an ultrasound enhancing agent through an IV if needed to better visualize your heart during the echo. This procedure takes approximately one hour.  There are no restrictions for this procedure.  This will take place at [address] [facility] [address]) [address], [place] [address]  Please note: We ask at that you not bring children with you during ultrasound (echo/ vascular) testing. Due to room size and safety concerns, children are not allowed in the ultrasound rooms during exams. Our front office staff cannot provide observation of children in our lobby area while testing is being conducted. An adult accompanying a patient to their appointment will only be allowed in the ultrasound room at the discretion of the ultrasound technician under special circumstances. We apologize for any inconvenience.    Follow-Up: At [facility], you and your health needs are our priority.  As part of our continuing mission to provide you with  exceptional heart care, we have created designated Provider Care Teams.  These Care Teams include your primary Cardiologist (physician) and Advanced Practice Providers (APPs -  Physician Assistants and Nurse Practitioners) who all work together to provide you with the care you need, when you need it.  We recommend signing up for the patient portal called "MyChart".  Sign up information is provided on this After Visit Summary.  MyChart is used to connect with patients for Virtual Visits (Telemedicine).  Patients are able to view lab/test results, encounter notes, upcoming appointments, etc.  Non-urgent messages can be sent to your provider as well.   To learn more about what you can do with MyChart, go to [email or web address].    Your next appointment:   3 month(s)  Provider:   You may see Yvonne Kendall, MD or one of the following Advanced Practice Providers on your designated Care Team:   Nicolasa Ducking, NP Eula Listen, [name] Cadence Fransico Michael, [name] Charlsie Quest, NP Carlos Levering, NP

## 2022-12-29 ENCOUNTER — Other Ambulatory Visit: Payer: Self-pay | Admitting: Family Medicine

## 2022-12-29 DIAGNOSIS — K219 Gastro-esophageal reflux disease without esophagitis: Secondary | ICD-10-CM

## 2022-12-31 NOTE — Telephone Encounter (Signed)
Requested Prescriptions  Pending Prescriptions Disp Refills   omeprazole (PRILOSEC) 20 MG capsule [Pharmacy Med Name: OMEPRAZOLE DR 20 MG CAPSULE] 90 capsule 0    Sig: TAKE 1 CAPSULE BY MOUTH EVERY DAY     Gastroenterology: Proton Pump Inhibitors Passed - 12/31/2022 10:47 AM      Passed - Valid encounter within last 12 months    Recent Outpatient Visits           1 month ago Annual physical exam   [facility] Sherlyn Hay, DO   9 months ago Allergic conjunctivitis of both eyes   [facility] [name], [name]   1 year ago Elevated blood pressure reading in office with white coat syndrome, with diagnosis of hypertension   [facility] [name], [name]   2 years ago Essential hypertension   [facility] Malva Limes, MD   2 years ago Colon cancer screening   [facility] Malva Limes, MD       Future Appointments             In 2 months End, Cristal Deer, MD [facility] [place]   In 3 months Deirdre Evener, MD [facility]   In 4 months Pardue, Monico Blitz, DO [facility]

## 2023-01-30 ENCOUNTER — Ambulatory Visit: Payer: Medicare Other | Attending: Internal Medicine

## 2023-01-30 DIAGNOSIS — R011 Cardiac murmur, unspecified: Secondary | ICD-10-CM | POA: Diagnosis present

## 2023-01-30 LAB — ECHOCARDIOGRAM COMPLETE
AV Mean grad: 2 mm[Hg]
AV Peak grad: 3.1 mm[Hg]
Ao pk vel: 0.88 m/s
Area-P 1/2: 3.21 cm2
S' Lateral: 1.9 cm

## 2023-03-14 ENCOUNTER — Other Ambulatory Visit: Payer: Self-pay | Admitting: Physician Assistant

## 2023-03-14 DIAGNOSIS — J301 Allergic rhinitis due to pollen: Secondary | ICD-10-CM

## 2023-03-16 ENCOUNTER — Other Ambulatory Visit: Payer: Self-pay | Admitting: Family Medicine

## 2023-03-16 DIAGNOSIS — N1831 Chronic kidney disease, stage 3a: Secondary | ICD-10-CM

## 2023-03-16 DIAGNOSIS — I10 Essential (primary) hypertension: Secondary | ICD-10-CM

## 2023-03-18 NOTE — Telephone Encounter (Signed)
 Requested Prescriptions  Pending Prescriptions Disp Refills   amLODipine (NORVASC) 2.5 MG tablet [Pharmacy Med Name: AMLODIPINE BESYLATE 2.5 MG TAB] 90 tablet 0    Sig: TAKE 1 TABLET BY MOUTH EVERY DAY IN THE EVENING     Cardiovascular: Calcium Channel Blockers 2 Passed - 03/18/2023  2:48 PM      Passed - Last BP in normal range    BP Readings from Last 1 Encounters:  12/26/22 132/78         Passed - Last Heart Rate in normal range    Pulse Readings from Last 1 Encounters:  12/26/22 71         Passed - Valid encounter within last 6 months    Recent Outpatient Visits           4 months ago Annual physical exam   [facility] Sherlyn Hay, DO   12 months ago Allergic conjunctivitis of both eyes   [facility] [name], [name]   1 year ago Elevated blood pressure reading in office with white coat syndrome, with diagnosis of hypertension   [name], [name]   2 years ago Essential hypertension   [facility] Malva Limes, MD   2 years ago Colon cancer screening   [facility] Malva Limes, MD       Future Appointments             In 1 week End, Cristal Deer, MD [facility] [place]   In 3 weeks Deirdre Evener, MD [facility] [place]   In 1 month Pardue, Monico Blitz, DO [facility], [facility]

## 2023-03-26 LAB — LIPID PANEL
Chol/HDL Ratio: 4 ratio (ref 0.0–4.4)
Cholesterol, Total: 237 mg/dL — ABNORMAL HIGH (ref 100–199)
HDL: 60 mg/dL (ref >39)
LDL Chol Calc (NIH): 146 mg/dL — ABNORMAL HIGH (ref 0–99)
Triglycerides: 176 mg/dL — ABNORMAL HIGH (ref 0–149)
VLDL Cholesterol Cal: 31 mg/dL (ref 5–40)

## 2023-03-28 ENCOUNTER — Ambulatory Visit: Payer: Medicare Other | Attending: Internal Medicine | Admitting: Internal Medicine

## 2023-03-28 ENCOUNTER — Encounter: Payer: Self-pay | Admitting: Internal Medicine

## 2023-03-28 VITALS — BP 148/86 | HR 68 | Ht 67.0 in | Wt 132.2 lb

## 2023-03-28 DIAGNOSIS — I10 Essential (primary) hypertension: Secondary | ICD-10-CM | POA: Diagnosis present

## 2023-03-28 DIAGNOSIS — E7800 Pure hypercholesterolemia, unspecified: Secondary | ICD-10-CM | POA: Insufficient documentation

## 2023-03-28 DIAGNOSIS — I700 Atherosclerosis of aorta: Secondary | ICD-10-CM | POA: Insufficient documentation

## 2023-03-28 DIAGNOSIS — Z79899 Other long term (current) drug therapy: Secondary | ICD-10-CM | POA: Insufficient documentation

## 2023-03-28 MED ORDER — EZETIMIBE 10 MG PO TABS: 10.0000 mg | ORAL_TABLET | Freq: Every day | ORAL | 3 refills | Status: AC

## 2023-03-28 NOTE — Progress Notes (Signed)
 Cardiology Office Note:  .   Date:  03/28/2023  ID:  Abigail Acosta, DOB [birth year], MRN [id number] PCP: Sherlyn Hay, DO  [facility] Providers Cardiologist:  Yvonne Kendall, MD     History of Present Illness: Abigail Acosta is a 83 y.o. female with history of hypertension, hyperlipidemia, hypothyroidism, chronic kidney disease stage IIIa, and varicose veins, who presents for follow-up of cardiovascular risk.  I met her in December, at which time she was concerned about her own cardiovascular risk asked her her husband had a "cardiovascular event."  Prior workup was notable for aortic atherosclerosis noted in the abdominal aorta on a CT scan as well as hyperlipidemia with total cholesterol of 237 and LDL of 156.  I recommended addition of a statin, which Abigail Acosta wished to defer in favor of lifestyle modifications.  Repeat lipid panel earlier this week showed slight improvement in her LDL to 146.  Today, Abigail Acosta reports that she has been feeling well.  She is exercising some but admits to not having made any significant dietary changes to help improve her cholesterol.  She is encouraged that her LDL came down a little bit.  She has not had any chest pain, shortness of breath, palpitations, lightheadedness, or edema.  She exercises intermittently without any symptoms.  ROS: See HPI  Studies Reviewed: .        Risk Assessment/Calculations:     HYPERTENSION CONTROL Vitals:   03/28/23 1011 03/28/23 1040  BP: (!) 150/78 (!) 148/86    The patient's blood pressure is elevated above target today.  In order to address the patient's elevated BP: Blood pressure will be monitored at home to determine if medication changes need to be made.; The blood pressure is usually elevated in clinic.  Blood pressures monitored at home have been optimal.          Physical Exam:   VS:  BP (!) 148/86 (BP Location: Left Arm, Patient Position: Sitting, Cuff Size: Normal)   Pulse 68   Ht  5\' 7"  (1.702 m)   Wt 132 lb 3.2 oz (60 kg)   SpO2 98%   BMI 20.71 kg/m    Wt Readings from Last 3 Encounters:  03/28/23 132 lb 3.2 oz (60 kg)  12/26/22 129 lb 12.8 oz (58.9 kg)  11/02/22 132 lb 9.6 oz (60.1 kg)    General:  NAD. Neck: No JVD or HJR. Lungs: Clear to auscultation bilaterally without wheezes or crackles. Heart: Regular rate and rhythm with 1/6 systolic murmur. Abdomen: Soft, nontender, nondistended. Extremities: No lower extremity edema.  ASSESSMENT AND PLAN: .    Hyperlipidemia and aortic atherosclerosis: Abigail Acosta remains asymptomatic.  Her LDL improved slightly since our last visit, though it is still above goal, particularly with evidence of aortic atherosclerosis on prior cross-sectional imaging of the aorta.  She did not tolerate lovastatin due to myalgias and would like to avoid a statin if possible.  We discussed the importance of lifestyle modifications.  We have agreed to add ezetimibe 10 mg daily for target LDL less than 100.  Will plan to recheck a lipid panel and ALT in about 3 months.  Hypertension: Blood pressure mildly to moderately elevated today, though Abigail Acosta reports that blood pressure is typically a little better at home.  Sodium restriction encouraged.  We have deferred medication changes today.  I advised Abigail Acosta to monitor her blood pressure at home and to alert [place] if it is consistently above  140/90.    Dispo: Return to clinic in 6 months.  Signed, Yvonne Kendall, MD

## 2023-03-28 NOTE — Patient Instructions (Signed)
 Medication Instructions:  Start Zetia 10 mg take one tablet by mouth daily.   Lab Work:  Your provider would like for you to return in 3 months to have the following labs drawn: Lipid/ALT.   Please go to [facility] [address] ([facility] [address], [place] [address] You do not need an appointment.  They are open from 8 am- 4:30 pm.  Lunch from 1:00 pm- 2:00 pm You will  need to be fasting.   You may also go to one of the following [facility]:  [address] [place], [place] [address] Phone: [phone] Lab hours: Mon-Fri 8 am- 5 pm    Lunch 12 pm- 1 pm  [address] [place],  [place]  [address]  [place] Phone: [phone] Lab hours: 7 am- 4 pm Lunch 12 pm-1 pm   [address] [place],  [place]  [address]  [place] Phone: [phone] Lab hours: Mon-Fri 8 am- 5 pm    Lunch 12 pm- 1 pm   If you have labs (blood work) drawn today and your tests are completely normal, you will receive your results only by: MyChart Message (if you have MyChart) OR A paper copy in the mail If you have any lab test that is abnormal or we need to change your treatment, we will call you to review the results.   Testing/Procedures: No test ordered today    Follow-Up: At [facility], you and your health needs are our priority.  As part of our continuing mission to provide you with exceptional heart care, we have created designated Provider Care Teams.  These Care Teams include your primary Cardiologist (physician) and Advanced Practice Providers (APPs -  Physician Assistants and Nurse Practitioners) who all work together to provide you with the care you need, when you need it.  We recommend signing up for the patient portal called "MyChart".  Sign up information is provided on this After Visit Summary.  MyChart is used to connect with patients for Virtual Visits (Telemedicine).  Patients are able to view lab/test results, encounter notes, upcoming appointments, etc.   Non-urgent messages can be sent to your provider as well.   To learn more about what you can do with MyChart, go to [email or web address].    Your next appointment:   6 month(s)  Provider:   You may see Yvonne Kendall, MD or one of the following Advanced Practice Providers on your designated Care Team:   Nicolasa Ducking, NP Eula Listen, [name] Cadence Fransico Michael, [name] Charlsie Quest, NP Carlos Levering, NP

## 2023-04-11 ENCOUNTER — Ambulatory Visit: Payer: Medicare Other | Admitting: Dermatology

## 2023-04-11 ENCOUNTER — Ambulatory Visit: Admitting: Dermatology

## 2023-04-13 ENCOUNTER — Other Ambulatory Visit: Payer: Self-pay | Admitting: Family Medicine

## 2023-04-13 DIAGNOSIS — J301 Allergic rhinitis due to pollen: Secondary | ICD-10-CM

## 2023-04-13 DIAGNOSIS — N1831 Chronic kidney disease, stage 3a: Secondary | ICD-10-CM

## 2023-04-16 NOTE — Telephone Encounter (Signed)
 Requested Prescriptions  Pending Prescriptions Disp Refills   montelukast (SINGULAIR) 10 MG tablet [Pharmacy Med Name: MONTELUKAST SOD 10 MG TABLET] 90 tablet 0    Sig: TAKE 1 TABLET BY MOUTH EVERYDAY AT BEDTIME     Pulmonology:  Leukotriene Inhibitors Failed - 04/16/2023  9:36 AM      Failed - Valid encounter within last 12 months    Recent Outpatient Visits           10 years ago Sinusitis, acute   [facility], MD       Future Appointments             In 2 weeks Willeen Niece, MD [facility]   In 2 weeks Pardue, Monico Blitz, DO [facility]

## 2023-05-01 ENCOUNTER — Encounter: Payer: Self-pay | Admitting: Dermatology

## 2023-05-01 ENCOUNTER — Ambulatory Visit (INDEPENDENT_AMBULATORY_CARE_PROVIDER_SITE_OTHER): Admitting: Dermatology

## 2023-05-01 DIAGNOSIS — Z1283 Encounter for screening for malignant neoplasm of skin: Secondary | ICD-10-CM | POA: Diagnosis not present

## 2023-05-01 DIAGNOSIS — W908XXA Exposure to other nonionizing radiation, initial encounter: Secondary | ICD-10-CM | POA: Diagnosis not present

## 2023-05-01 DIAGNOSIS — L578 Other skin changes due to chronic exposure to nonionizing radiation: Secondary | ICD-10-CM | POA: Diagnosis not present

## 2023-05-01 DIAGNOSIS — D692 Other nonthrombocytopenic purpura: Secondary | ICD-10-CM

## 2023-05-01 DIAGNOSIS — L820 Inflamed seborrheic keratosis: Secondary | ICD-10-CM | POA: Diagnosis not present

## 2023-05-01 DIAGNOSIS — D1801 Hemangioma of skin and subcutaneous tissue: Secondary | ICD-10-CM

## 2023-05-01 DIAGNOSIS — L821 Other seborrheic keratosis: Secondary | ICD-10-CM

## 2023-05-01 DIAGNOSIS — L853 Xerosis cutis: Secondary | ICD-10-CM

## 2023-05-01 DIAGNOSIS — Z7189 Other specified counseling: Secondary | ICD-10-CM

## 2023-05-01 DIAGNOSIS — L814 Other melanin hyperpigmentation: Secondary | ICD-10-CM

## 2023-05-01 DIAGNOSIS — I781 Nevus, non-neoplastic: Secondary | ICD-10-CM

## 2023-05-01 DIAGNOSIS — D229 Melanocytic nevi, unspecified: Secondary | ICD-10-CM

## 2023-05-01 DIAGNOSIS — I8393 Asymptomatic varicose veins of bilateral lower extremities: Secondary | ICD-10-CM

## 2023-05-01 NOTE — Progress Notes (Signed)
 Follow-Up Visit   Subjective  Abigail Acosta is a 83 y.o. female who presents for the following: Skin Cancer Screening and Full Body Skin Exam. No personal Hx of skin cancer or dysplastic nevi. No family Hx of MM. Hx of actinic keratoses.   The patient presents for Total-Body Skin Exam (TBSE) for skin cancer screening and mole check. The patient has spots, moles and lesions to be evaluated, some may be new or changing and the patient may have concern these could be cancer.  She has dark spot on her forehead she would like removed.    The following portions of the chart were reviewed this encounter and updated as appropriate: medications, allergies, medical history  Review of Systems:  No other skin or systemic complaints except as noted in HPI or Assessment and Plan.  Objective  Well appearing patient in no apparent distress; mood and affect are within normal limits.  A full examination was performed including scalp, head, eyes, ears, nose, lips, neck, chest, axillae, abdomen, back, buttocks, bilateral upper extremities, bilateral lower extremities, hands, feet, fingers, toes, fingernails, and toenails. All findings within normal limits unless otherwise noted below.   Relevant physical exam findings are noted in the Assessment and Plan.  Right Upper Eyebrow x1 Waxy tan macule   Left Lower Leg medial x1, L medial upper knee x1, R lower calf x1 (3) Erythematous keratotic or waxy stuck-on papule  Assessment & Plan   SKIN CANCER SCREENING PERFORMED TODAY.  ACTINIC DAMAGE - Chronic condition, secondary to cumulative UV/sun exposure - diffuse scaly erythematous macules with underlying dyspigmentation - Recommend daily broad spectrum sunscreen SPF 30+ to sun-exposed areas, reapply every 2 hours as needed.  - Staying in the shade or wearing long sleeves, sun glasses (UVA+UVB protection) and wide brim hats (4-inch brim around the entire circumference of the hat) are also recommended for  sun protection.  - Call for new or changing lesions.  LENTIGINES, SEBORRHEIC KERATOSES, HEMANGIOMAS - Benign normal skin lesions - Benign-appearing - Call for any changes  MELANOCYTIC NEVI - Tan-brown and/or pink-flesh-colored symmetric macules and papules - Benign appearing on exam today - Observation - Call clinic for new or changing moles - Recommend daily use of broad spectrum spf 30+ sunscreen to sun-exposed areas.  - Check nails when remove polish.   LENTIGO vs SK, with underlying actinic damage Exam: 2 x 1.5 cm pink tan patch with telangiectasia at left malar cheek, no scale, stable per pt for years, no changes. Due to sun exposure Treatment Plan: Benign-appearing, observe. Recommend daily broad spectrum sunscreen SPF 30+ to sun-exposed areas, reapply every 2 hours as needed.  Call for any changes  Counseling for BBL / IPL / Laser and Coordination of Care Discussed the treatment option of Broad Band Light (BBL) /Intense Pulsed Light (IPL)/ Laser for skin discoloration, including brown spots and redness.  Typically we recommend at least 1-3 treatment sessions about 5-8 weeks apart for best results.  Cannot have tanned skin when BBL performed, and regular use of sunscreen/photoprotection is advised after the procedure to help maintain results. The patient's condition may also require "maintenance treatments" in the future.  The fee for BBL / laser treatments is $350 per treatment session for the whole face.  A fee can be quoted for other parts of the body.  Insurance typically does not pay for BBL/laser treatments and therefore the fee is an out-of-pocket cost. Recommend prophylactic valtrex treatment. Once scheduled for procedure, will send Rx in prior to  patient's appointment.   Xerosis - diffuse xerotic patches at back - recommend gentle, hydrating skin care - gentle skin care handout given  Varicose Veins/Spider Veins - Dilated blue, purple or red veins at the lower  extremities - Reassured - Smaller vessels can be treated by sclerotherapy (a procedure to inject a medicine into the veins to make them disappear) if desired, but the treatment is not covered by insurance. Larger vessels may be covered if symptomatic and we would refer to vascular surgeon if treatment desired.  Purpura - Chronic; persistent and recurrent.  Treatable, but not curable. - Violaceous macules and patches - Benign - Related to trauma, age, sun damage and/or use of blood thinners, chronic use of topical and/or oral steroids - Observe - Can use OTC arnica containing moisturizer such as Dermend Bruise Formula if desired - Call for worsening or other concerns   SEBORRHEIC KERATOSIS Right Upper Eyebrow x1 Reassured benign age-related growth.   Discussed cosmetic procedure destruction of benign, noncovered.  $60 for 1st lesion and $15 for each additional lesion if done on the same day.  Maximum charge $350.  One touch-up treatment included no charge. Discussed risks of treatment including dyspigmentation, small scar, and/or recurrence. Recommend daily broad spectrum sunscreen SPF 30+/photoprotection to treated areas once healed.   Cosmetic cryotherapy x 1 today, $60 Destruction of lesion - Right Upper Eyebrow x1  Destruction method: cryotherapy   Informed consent: discussed and consent obtained   Lesion destroyed using liquid nitrogen: Yes   Region frozen until ice ball extended beyond lesion: Yes   Outcome: patient tolerated procedure well with no complications   Post-procedure details: wound care instructions given   Additional details:  Prior to procedure, discussed risks of blister formation, small wound, skin dyspigmentation, or rare scar following cryotherapy. Recommend Vaseline ointment to treated areas while healing.  INFLAMED SEBORRHEIC KERATOSIS (3) Left Lower Leg medial x1, L medial upper knee x1, R lower calf x1 (3) Symptomatic, irritating, patient would like  treated. Destruction of lesion - Left Lower Leg medial x1, L medial upper knee x1, R lower calf x1 (3)  Destruction method: cryotherapy   Informed consent: discussed and consent obtained   Lesion destroyed using liquid nitrogen: Yes   Region frozen until ice ball extended beyond lesion: Yes   Outcome: patient tolerated procedure well with no complications   Post-procedure details: wound care instructions given   Additional details:  Prior to procedure, discussed risks of blister formation, small wound, skin dyspigmentation, or rare scar following cryotherapy. Recommend Vaseline ointment to treated areas while healing.    Return in about 1 year (around 04/30/2024) for TBSE.  I, Darcie Easterly, CMA, am acting as scribe for Artemio Larry, MD.   Documentation: I have reviewed the above documentation for accuracy and completeness, and I agree with the above.  Artemio Larry, MD

## 2023-05-01 NOTE — Patient Instructions (Addendum)
 Cryotherapy Aftercare  Wash gently with soap and water everyday.   Apply Vaseline and Band-Aid daily until healed.     Counseling for BBL / IPL / Laser and Coordination of Care Discussed the treatment option of Broad Band Light (BBL) /Intense Pulsed Light (IPL)/ Laser for skin discoloration, including brown spots and redness.  Typically we recommend at least 1-3 treatment sessions about 5-8 weeks apart for best results.  Cannot have tanned skin when BBL performed, and regular use of sunscreen/photoprotection is advised after the procedure to help maintain results. The patient's condition may also require "maintenance treatments" in the future.  The fee for BBL / laser treatments is $350 per treatment session for the whole face.  A fee can be quoted for other parts of the body.  Insurance typically does not pay for BBL/laser treatments and therefore the fee is an out-of-pocket cost. Recommend prophylactic valtrex treatment. Once scheduled for procedure, will send Rx in prior to patient's appointment.     Recommend daily broad spectrum sunscreen SPF 30+ to sun-exposed areas, reapply every 2 hours as needed. Call for new or changing lesions.  Staying in the shade or wearing long sleeves, sun glasses (UVA+UVB protection) and wide brim hats (4-inch brim around the entire circumference of the hat) are also recommended for sun protection.      Melanoma ABCDEs  Melanoma is the most dangerous type of skin cancer, and is the leading cause of death from skin disease.  You are more likely to develop melanoma if you: Have light-colored skin, light-colored eyes, or red or blond hair Spend a lot of time in the sun Tan regularly, either outdoors or in a tanning bed Have had blistering sunburns, especially during childhood Have a close family member who has had a melanoma Have atypical moles or large birthmarks  Early detection of melanoma is key since treatment is typically straightforward and cure rates  are extremely high if we catch it early.   The first sign of melanoma is often a change in a mole or a new dark spot.  The ABCDE system is a way of remembering the signs of melanoma.  A for asymmetry:  The two halves do not match. B for border:  The edges of the growth are irregular. C for color:  A mixture of colors are present instead of an even brown color. D for diameter:  Melanomas are usually (but not always) greater than 6mm - the size of a pencil eraser. E for evolution:  The spot keeps changing in size, shape, and color.  Please check your skin once per month between visits. You can use a small mirror in front and a large mirror behind you to keep an eye on the back side or your body.   If you see any new or changing lesions before your next follow-up, please call to schedule a visit.  Please continue daily skin protection including broad spectrum sunscreen SPF 30+ to sun-exposed areas, reapplying every 2 hours as needed when you're outdoors.   Staying in the shade or wearing long sleeves, sun glasses (UVA+UVB protection) and wide brim hats (4-inch brim around the entire circumference of the hat) are also recommended for sun protection.      Due to recent changes in healthcare laws, you may see results of your pathology and/or laboratory studies on MyChart before the doctors have had a chance to review them. We understand that in some cases there may be results that are confusing or concerning  to you. Please understand that not all results are received at the same time and often the doctors may need to interpret multiple results in order to provide you with the best plan of care or course of treatment. Therefore, we ask that you please give [place] 2 business days to thoroughly review all your results before contacting the office for clarification. Should we see a critical lab result, you will be contacted sooner.   If You Need Anything After Your Visit  If you have any questions or  concerns for your doctor, please call our main line at [phone] and press option 4 to reach your doctor's medical assistant. If no one answers, please leave a voicemail as directed and we will return your call as soon as possible. Messages left after 4 pm will be answered the following business day.   You may also send [place] a message via MyChart. We typically respond to MyChart messages within 1-2 business days.  For prescription refills, please ask your pharmacy to contact our office. Our fax number is [phone].  If you have an urgent issue when the clinic is closed that cannot wait until the next business day, you can page your doctor at the number below.    Please note that while we do our best to be available for urgent issues outside of office hours, we are not available 24/7.   If you have an urgent issue and are unable to reach [place], you may choose to seek medical care at your doctor's office, retail clinic, urgent care center, or emergency room.  If you have a medical emergency, please immediately call 911 or go to the emergency department.  Pager Numbers  - Dr. Gwen Pounds: [phone]  - Dr. Roseanne Reno: [phone]  - Dr. Katrinka Blazing: [phone]   In the event of inclement weather, please call our main line at [phone] for an update on the status of any delays or closures.  Dermatology Medication Tips: Please keep the boxes that topical medications come in in order to help keep track of the instructions about where and how to use these. Pharmacies typically print the medication instructions only on the boxes and not directly on the medication tubes.   If your medication is too expensive, please contact our office at [phone] option 4 or send [place] a message through MyChart.   We are unable to tell what your co-pay for medications will be in advance as this is different depending on your insurance coverage. However, we may be able to find a substitute medication at lower cost  or fill out paperwork to get insurance to cover a needed medication.   If a prior authorization is required to get your medication covered by your insurance company, please allow [place] 1-2 business days to complete this process.  Drug prices often vary depending on where the prescription is filled and some pharmacies may offer cheaper prices.  The website www.goodrx.com contains coupons for medications through different pharmacies. The prices here do not account for what the cost may be with help from insurance (it may be cheaper with your insurance), but the website can give you the price if you did not use any insurance.  - You can print the associated coupon and take it with your prescription to the pharmacy.  - You may also stop by our office during regular business hours and pick up a GoodRx coupon card.  - If you need your prescription sent electronically to a different pharmacy, notify our office through  [facility] MyChart or by phone at [phone] option 4.     Si Usted Necesita Algo Despus de Su Visita  Tambin puede enviarnos un mensaje a travs de Clinical cytogeneticist. Por lo general respondemos a los mensajes de MyChart en el transcurso de 1 a 2 das hbiles.  Para renovar recetas, por favor pida a su farmacia que se ponga en contacto con nuestra oficina. Annie Sable de fax es [place] [phone].  Si tiene un asunto urgente cuando la clnica est cerrada y que no puede esperar hasta el siguiente da hbil, puede llamar/localizar a su doctor(a) al nmero que aparece a continuacin.   Por favor, tenga en cuenta que aunque hacemos todo lo posible para estar disponibles para asuntos urgentes fuera del horario de [place], no estamos disponibles las 24 horas del da, los 7 [address]  [address] de la [place].   Si tiene un problema urgente y no puede comunicarse con nosotros, puede optar por buscar atencin mdica  en el consultorio de su doctor(a), en una clnica privada, en un centro de atencin urgente o en una  sala de emergencias.  Si tiene Engineer, drilling, por favor llame inmediatamente al 911 o vaya a la sala de emergencias.  Nmeros de bper  - Dr. Gwen Pounds: [phone]  - Dra. Roseanne Reno: [phone]  - Dr. Katrinka Blazing: [phone]   En caso de inclemencias del tiempo, por favor llame a Lacy Duverney principal al [phone] para una actualizacin sobre el [place] de cualquier retraso o cierre.  Consejos para la medicacin en dermatologa: Por favor, guarde las cajas en las que vienen los medicamentos de uso tpico para ayudarle a seguir las instrucciones sobre dnde y cmo usarlos. Las farmacias generalmente imprimen las instrucciones del medicamento slo en las cajas y no directamente en los tubos del [place].   Si su medicamento es muy caro, por favor, pngase en contacto con Rolm [place]ala llamando al [phone] y presione la opcin 4 o envenos un mensaje a travs de Clinical cytogeneticist.   No podemos decirle cul ser su copago por los medicamentos por adelantado ya que esto es diferente dependiendo de la cobertura de su seguro. Sin embargo, es posible que podamos encontrar un medicamento sustituto a Audiological scientist un formulario para que el seguro cubra el medicamento que se considera necesario.   Si se requiere una autorizacin previa para que su compaa de seguros [place] su medicamento, por favor permtanos de 1 a 2 das hbiles para completar [address]  Los precios de los medicamentos varan con frecuencia dependiendo del Environmental consultant de dnde se surte la receta y alguna farmacias pueden ofrecer precios ms baratos.  El sitio web www.goodrx.com tiene cupones para medicamentos de Health and safety inspector. Los precios aqu no tienen en cuenta lo que podra costar con la ayuda del seguro (puede ser ms barato con su seguro), pero el sitio web puede darle el precio si no utiliz Tourist information centre manager.  - Puede imprimir el cupn correspondiente y llevarlo con su receta a la farmacia.  - Tambin puede  pasar por nuestra oficina durante el horario de atencin regular y Education officer, museum una tarjeta de cupones de GoodRx.  - Si necesita que su receta se enve electrnicamente a una farmacia diferente, informe a nuestra oficina a travs de MyChart de [address] o por telfono llamando al [phone] y presione la opcin 4.

## 2023-05-02 ENCOUNTER — Ambulatory Visit: Admitting: Family Medicine

## 2023-05-03 ENCOUNTER — Ambulatory Visit: Payer: Self-pay | Admitting: Family Medicine

## 2023-05-20 ENCOUNTER — Encounter: Payer: Self-pay | Admitting: Family Medicine

## 2023-05-20 ENCOUNTER — Ambulatory Visit (INDEPENDENT_AMBULATORY_CARE_PROVIDER_SITE_OTHER): Admitting: Family Medicine

## 2023-05-20 VITALS — BP 132/66 | HR 69 | Resp 16 | Ht 62.0 in | Wt 135.5 lb

## 2023-05-20 DIAGNOSIS — I700 Atherosclerosis of aorta: Secondary | ICD-10-CM

## 2023-05-20 DIAGNOSIS — T466X5A Adverse effect of antihyperlipidemic and antiarteriosclerotic drugs, initial encounter: Secondary | ICD-10-CM

## 2023-05-20 DIAGNOSIS — G720 Drug-induced myopathy: Secondary | ICD-10-CM

## 2023-05-20 DIAGNOSIS — J3089 Other allergic rhinitis: Secondary | ICD-10-CM

## 2023-05-20 DIAGNOSIS — Z1231 Encounter for screening mammogram for malignant neoplasm of breast: Secondary | ICD-10-CM

## 2023-05-20 DIAGNOSIS — I10 Essential (primary) hypertension: Secondary | ICD-10-CM

## 2023-05-20 DIAGNOSIS — N1831 Chronic kidney disease, stage 3a: Secondary | ICD-10-CM

## 2023-05-20 DIAGNOSIS — E7800 Pure hypercholesterolemia, unspecified: Secondary | ICD-10-CM

## 2023-05-20 DIAGNOSIS — R635 Abnormal weight gain: Secondary | ICD-10-CM

## 2023-05-20 DIAGNOSIS — J302 Other seasonal allergic rhinitis: Secondary | ICD-10-CM

## 2023-05-20 DIAGNOSIS — K219 Gastro-esophageal reflux disease without esophagitis: Secondary | ICD-10-CM

## 2023-05-20 MED ORDER — OMEPRAZOLE 20 MG PO CPDR: 20.0000 mg | DELAYED_RELEASE_CAPSULE | Freq: Every day | ORAL | 3 refills | Status: AC

## 2023-05-20 MED ORDER — LISINOPRIL-HYDROCHLOROTHIAZIDE 10-12.5 MG PO TABS: ORAL_TABLET | ORAL | 1 refills | Status: DC

## 2023-05-20 MED ORDER — AMLODIPINE BESYLATE 2.5 MG PO TABS: 2.5000 mg | ORAL_TABLET | Freq: Every evening | ORAL | 1 refills | Status: DC

## 2023-05-20 MED ORDER — LORATADINE 10 MG PO TABS: 10.0000 mg | ORAL_TABLET | Freq: Every day | ORAL | 1 refills | Status: DC | PRN

## 2023-05-20 NOTE — Patient Instructions (Addendum)
 Recommended vaccines: Tdap (tetanus, diphtheria and pertussis).  May stop vitamin B12 supplement

## 2023-05-20 NOTE — Progress Notes (Unsigned)
 Established patient visit   Patient: Abigail Acosta   DOB: [birth year]   83 y.o. Female  MRN: [id number] Visit Date: 05/20/2023  Today's healthcare provider: Carlean Charter, DO   Chief Complaint  Patient presents with   Follow-up    6 month f/u no other concerns   Subjective    HPI Abigail Acosta "Abigail Acosta" is an 83 year old female with hypertension who presents for a follow-up visit.  She experiences elevated blood pressure readings in the clinic, but at home, her blood pressure typically measures around 132/66 mmHg. She takes half a tablet of lisinopril -hydrochlorothiazide  five days a week, avoiding it when her blood pressure is below 125 mmHg to prevent hypotension. Occasionally, she takes a whole tablet if her blood pressure is particularly high, such as 155 mmHg. She also takes amlodipine  at night for blood pressure management.  She has been on a non-statin cholesterol medication for about a month and a half after a cardiologist visit. She has a history of adverse reactions to statins, specifically lovastatin , which caused severe side effects.  She takes omeprazole  20 mg for gastroesophageal reflux disease, which she attempted to discontinue but experienced bile regurgitation on the third day.  She has a history of an abnormal mammogram three years ago, which was later determined to be non-cancerous. She is due for her next mammogram in July.  She recalls receiving a tetanus shot approximately ten years ago after a wrist injury but has not had a flu shot in recent years and prefers not to receive one.  She has been experiencing weight gain despite being active and eating carefully. A broken toe has limited her ability to walk, contributing to recent inactivity. Her daily activities include housework, gardening, and social engagements.  She takes vitamin B12, which was previously found to be at a high level, and she plans to stop it. She also takes a B complex vitamin.  No  recent illness or significant symptoms aside from those discussed. Reports sneezing and coughing more due to pollen exposure.   ***  {History (Optional):23778}  Medications: Outpatient Medications Prior to Visit  Medication Sig   acetaminophen  (TYLENOL ) 500 MG tablet Take 500-1,000 mg by mouth 2 (two) times daily as needed (back pain.).   Biotin 1000 MCG tablet Take 1,000 mcg by mouth daily with lunch.   ezetimibe  (ZETIA ) 10 MG tablet Take 1 tablet (10 mg total) by mouth daily.   Multiple Vitamin (MULTIVITAMIN WITH MINERALS) TABS tablet Take 1 tablet by mouth daily with lunch. Women's Multivitamin   [DISCONTINUED] amLODipine  (NORVASC ) 2.5 MG tablet TAKE 1 TABLET BY MOUTH EVERY DAY IN THE EVENING   [DISCONTINUED] lisinopril -hydrochlorothiazide  (ZESTORETIC ) 10-12.5 MG tablet TAKE 1/2 TABLET BY MOUTH DAILY (Patient taking differently: Take 0.5 tablets by mouth daily. 1/2 - 1 tab as needed PRN)   [DISCONTINUED] loratadine  (CLARITIN ) 10 MG tablet TAKE 1 TABLET BY MOUTH EVERY DAY (Patient taking differently: Take 10 mg by mouth daily as needed.)   [DISCONTINUED] montelukast  (SINGULAIR ) 10 MG tablet TAKE 1 TABLET BY MOUTH EVERYDAY AT BEDTIME   [DISCONTINUED] omeprazole  (PRILOSEC) 20 MG capsule TAKE 1 CAPSULE BY MOUTH EVERY DAY   No facility-administered medications prior to visit.    Review of Systems ***  {Insert previous labs (optional):23779} {See past labs  Heme  Chem  Endocrine  Serology  Results Review (optional):1}   Objective    BP 132/66 Comment: home BP today  Pulse 69   Resp 16   Ht  5\' 2"  (1.575 m)   Wt 135 lb 8 oz (61.5 kg)   SpO2 99%   BMI 24.78 kg/m  {Insert last BP/Wt (optional):23777}{See vitals history (optional):1}   Physical Exam Vitals and nursing note reviewed.  Constitutional:      General: She is not in acute distress.    Appearance: Normal appearance.  HENT:     Head: Normocephalic and atraumatic.  Eyes:     General: No scleral icterus.     Conjunctiva/sclera: Conjunctivae normal.  Cardiovascular:     Rate and Rhythm: Normal rate.  Pulmonary:     Effort: Pulmonary effort is normal.  Neurological:     Mental Status: She is alert and oriented to person, place, and time. Mental status is at baseline.  Psychiatric:        Mood and Affect: Mood normal.        Behavior: Behavior normal.      No results found for any visits on 05/20/23.  Assessment & Plan    Perennial allergic rhinitis with seasonal variation -     Loratadine ; Take 1 tablet (10 mg total) by mouth daily as needed.  Dispense: 90 tablet; Refill: 1  Statin myopathy  Encounter for screening mammogram for breast cancer -     3D Screening Mammogram, Left and Right; Future  Essential hypertension -     amLODIPine  Besylate; Take 1 tablet (2.5 mg total) by mouth every evening.  Dispense: 90 tablet; Refill: 1 -     Lisinopril -hydroCHLOROthiazide ; Take 0.5 tablet daily as needed for SBP >125  Dispense: 45 tablet; Refill: 1  Gastroesophageal reflux disease, unspecified whether esophagitis present -     Omeprazole ; Take 1 capsule (20 mg total) by mouth daily.  Dispense: 90 capsule; Refill: 3  Stage 3a chronic kidney disease (HCC) -     amLODIPine  Besylate; Take 1 tablet (2.5 mg total) by mouth every evening.  Dispense: 90 tablet; Refill: 1  Aortic atherosclerosis (HCC)  Hypercholesteremia -     Lipid panel     Hypertension Blood pressure slightly elevated today, typically 132/66 mmHg at home. Takes lisinopril -hydrochlorothiazide  as needed, amlodipine  at night. Avoids medication if BP <125 mmHg to prevent hypotension. - Continue lisinopril -hydrochlorothiazide  half tablet daily as needed when systolic BP is 125 mmHg or higher. - Continue amlodipine  as prescribed.  Hyperlipidemia Started on non-statin cholesterol medication due to statin intolerance. - Order cholesterol panel after June 10th. - Send results to cardiologist.  Statin intolerance Documented  intolerance to statins, specifically lovastatin , with significant adverse effects. - Ensure statins are listed as an allergy/intolerance in medical record.  Obesity Reports weight gain despite activity and careful eating. Reduced activity due to broken toe may contribute. - Encourage monitoring of weight and dietary intake. - Advise resuming walking once toe is healed.  Gastroesophageal reflux disease (GERD) Reflux symptoms controlled with omeprazole  20 mg daily. - Continue omeprazole  20 mg daily.  Vitamin B12 supplementation Vitamin B12 level over 2000, advised to stop supplementation. - Discontinue vitamin B12 supplementation. - Continue B complex vitamin. - Recheck vitamin B12 level at next visit.  Wellness Visit Due for Medicare annual wellness visit. No acute concerns, generally healthy and active for age. - Schedule Medicare annual wellness visit in six months. ***  Return in about 6 months (around 11/20/2023) for mAWV (with AWV nurse) and for Chronic f/u w/PCP.      I discussed the assessment and treatment plan with the patient  The patient was provided an opportunity to ask  questions and all were answered. The patient agreed with the plan and demonstrated an understanding of the instructions.   The patient was advised to call back or seek an in-person evaluation if the symptoms worsen or if the condition fails to improve as anticipated.    Carlean Charter, DO  [facility] [phone] (phone) [phone] (fax)  [facility]

## 2023-05-27 ENCOUNTER — Encounter: Payer: Self-pay | Admitting: Family Medicine

## 2023-06-04 DIAGNOSIS — S92413A Displaced fracture of proximal phalanx of unspecified great toe, initial encounter for closed fracture: Secondary | ICD-10-CM | POA: Insufficient documentation

## 2023-07-02 LAB — LIPID PANEL
Chol/HDL Ratio: 3.2 ratio (ref 0.0–4.4)
Cholesterol, Total: 207 mg/dL — ABNORMAL HIGH (ref 100–199)
HDL: 64 mg/dL (ref >39)
LDL Chol Calc (NIH): 118 mg/dL — ABNORMAL HIGH (ref 0–99)
Triglycerides: 141 mg/dL (ref 0–149)
VLDL Cholesterol Cal: 25 mg/dL (ref 5–40)

## 2023-07-02 LAB — ALT: ALT: 17 IU/L (ref 0–32)

## 2023-07-03 ENCOUNTER — Ambulatory Visit: Payer: Self-pay | Admitting: Internal Medicine

## 2023-07-11 ENCOUNTER — Other Ambulatory Visit: Payer: Self-pay | Admitting: Family Medicine

## 2023-07-11 DIAGNOSIS — J301 Allergic rhinitis due to pollen: Secondary | ICD-10-CM

## 2023-07-11 DIAGNOSIS — N1831 Chronic kidney disease, stage 3a: Secondary | ICD-10-CM

## 2023-07-29 DIAGNOSIS — M1A4720 Other secondary chronic gout, left ankle and foot, without tophus (tophi): Secondary | ICD-10-CM | POA: Insufficient documentation

## 2023-09-04 ENCOUNTER — Ambulatory Visit: Admitting: Podiatry

## 2023-09-09 ENCOUNTER — Ambulatory Visit (INDEPENDENT_AMBULATORY_CARE_PROVIDER_SITE_OTHER)

## 2023-09-09 ENCOUNTER — Ambulatory Visit (INDEPENDENT_AMBULATORY_CARE_PROVIDER_SITE_OTHER): Admitting: Podiatry

## 2023-09-09 DIAGNOSIS — M19072 Primary osteoarthritis, left ankle and foot: Secondary | ICD-10-CM

## 2023-09-09 NOTE — Progress Notes (Signed)
 Subjective:  Patient ID: Abigail Acosta, female    DOB: 18-Nov-1940,  MRN: [id number] HPI Chief Complaint  Patient presents with   Toe Injury    Fractured left big toe back in April.Treated at [facility]. Gave her a round of prednisone . Did not improve. Checked Uric Acid which came back high normal She took colchicine for 5 says and had good improvement with swelling and pain. Now pain and swelling have returned. Her friend recommended Dr. Verta for a second opinion.     83 y.o. female presents with the above complaint.   ROS: Denies fever chills nausea mobic muscle aches pains calf pain back pain chest pain shortness of breath.  Past Medical History:  Diagnosis Date   Allergy    Cataract    Cataract    right eye-not mature   Complication of anesthesia    Elevated LFTs 04/03/2018   Hypertension    PONV (postoperative nausea and vomiting)    Varicose veins    Past Surgical History:  Procedure Laterality Date   ABDOMINAL HYSTERECTOMY     ANTERIOR AND POSTERIOR REPAIR N/A 04/28/2013   Procedure: REPAIR CYSTOCELE AND RECTOCELE;  Surgeon: Glendia DELENA Elizabeth, MD;  Location: [facility];  Service: Urology;  Laterality: N/A;   APPENDECTOMY     BREAST BIOPSY Left 12/22/2018   Needle Localization   BREAST BIOPSY Left 12/22/2018   Procedure: BREAST BIOPSY WITH NEEDLE LOCALIZATION;  Surgeon: Lane Shope, MD;  Location: [facility];  Service: General;  Laterality: Left;   CATARACT EXTRACTION Left    CYSTOSCOPY N/A 04/28/2013   Procedure: CYSTOSCOPY;  Surgeon: Glendia DELENA Elizabeth, MD;  Location: [facility];  Service: Urology;  Laterality: N/A;   HERNIA REPAIR Bilateral    inguinal   VAGINAL PROLAPSE REPAIR N/A 04/28/2013   Procedure: VAULT PROLAPSE AND GRAFT  ;  Surgeon: Glendia DELENA Elizabeth, MD;  Location: [facility];  Service: Urology;  Laterality: N/A;   VARICOSE VEIN SURGERY Right    stripping    Current Outpatient Medications:    methylPREDNISolone (MEDROL DOSEPAK) 4 MG TBPK tablet, Take by  mouth., Disp: , Rfl:    acetaminophen  (TYLENOL ) 500 MG tablet, Take 500-1,000 mg by mouth 2 (two) times daily as needed (back pain.)., Disp: , Rfl:    amLODipine  (NORVASC ) 2.5 MG tablet, Take 1 tablet (2.5 mg total) by mouth every evening., Disp: 90 tablet, Rfl: 1   Biotin 1000 MCG tablet, Take 1,000 mcg by mouth daily with lunch., Disp: , Rfl:    colchicine 0.6 MG tablet, TAKE 1 TABLET BY MOUTH TWICE A DAY FOR 5 DAYS, Disp: , Rfl:    ezetimibe  (ZETIA ) 10 MG tablet, Take 1 tablet (10 mg total) by mouth daily., Disp: 90 tablet, Rfl: 3   lisinopril -hydrochlorothiazide  (ZESTORETIC ) 10-12.5 MG tablet, Take 0.5 tablet daily as needed for SBP >125, Disp: 45 tablet, Rfl: 1   loratadine  (CLARITIN ) 10 MG tablet, Take 1 tablet (10 mg total) by mouth daily as needed., Disp: 90 tablet, Rfl: 1   montelukast  (SINGULAIR ) 10 MG tablet, TAKE 1 TABLET BY MOUTH EVERYDAY AT BEDTIME, Disp: , Rfl:    Multiple Vitamin (MULTIVITAMIN WITH MINERALS) TABS tablet, Take 1 tablet by mouth daily with lunch. Women's Multivitamin, Disp: , Rfl:    omeprazole  (PRILOSEC) 20 MG capsule, Take 1 capsule (20 mg total) by mouth daily., Disp: 90 capsule, Rfl: 3   predniSONE  (DELTASONE ) 10 MG tablet, PLEASE SEE ATTACHED FOR DETAILED DIRECTIONS, Disp: , Rfl:    predniSONE  (DELTASONE ) 5  MG tablet, TAKE BY MOUTH AS DIRECTED IN PACKAGE INSTRUCTIONS FOR 6 DAYS, Disp: , Rfl:   Allergies  Allergen Reactions   Lovastatin  Other (See Comments)    myalgias   Review of Systems Objective:  There were no vitals filed for this visit.  General: Well developed, nourished, in no acute distress, alert and oriented x3   Dermatological: Skin is warm, dry and supple bilateral. Nails x 10 are well maintained; remaining integument appears unremarkable at this time. There are no open sores, no preulcerative lesions, no rash or signs of infection present.  Vascular: Dorsalis Pedis artery and Posterior Tibial artery pedal pulses are 2/4 bilateral with  immedate capillary fill time. Pedal hair growth present. No varicosities and no lower extremity edema present bilateral.   Neruologic: Grossly intact via light touch bilateral. Vibratory intact via tuning fork bilateral. Protective threshold with Semmes Wienstein monofilament intact to all pedal sites bilateral. Patellar and Achilles deep tendon reflexes 2+ bilateral. No Babinski or clonus noted bilateral.   Musculoskeletal: No gross boney pedal deformities bilateral. No pain, crepitus, or limitation noted with foot and ankle range of motion bilateral. Muscular strength 5/5 in all groups tested bilateral.  She has limited range of motion with some soreness on palpation of the first metatarsal phalangeal joint of the left foot.  Gait: Unassisted, Nonantalgic.    Radiographs:  Radiographs taken today demonstrate an osseously mature individual moderate osteopenia.  Proximal phalanx hallux left appears to be healing from nondisplaced fracture.  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  She also has history of gouty arthritis.  Also demonstrates healing fracture.  Plan: Discussed etiology pathology and surgical therapies follow-up with me with any reoccurrence.     Karilyn Wind T. [place], [place]

## 2023-09-11 ENCOUNTER — Ambulatory Visit
Admission: RE | Admit: 2023-09-11 | Discharge: 2023-09-11 | Disposition: A | Discharge status: Home / Self Care | Source: Ambulatory Visit | Attending: Family Medicine | Admitting: Family Medicine

## 2023-09-11 DIAGNOSIS — Z1231 Encounter for screening mammogram for malignant neoplasm of breast: Secondary | ICD-10-CM | POA: Insufficient documentation

## 2023-09-17 ENCOUNTER — Ambulatory Visit: Payer: Self-pay | Admitting: Family Medicine

## 2023-09-25 ENCOUNTER — Other Ambulatory Visit: Payer: Self-pay | Admitting: Family Medicine

## 2023-09-25 DIAGNOSIS — J302 Other seasonal allergic rhinitis: Secondary | ICD-10-CM

## 2023-09-25 DIAGNOSIS — N1831 Chronic kidney disease, stage 3a: Secondary | ICD-10-CM

## 2023-09-25 DIAGNOSIS — I10 Essential (primary) hypertension: Secondary | ICD-10-CM

## 2023-11-12 ENCOUNTER — Ambulatory Visit: Payer: Self-pay

## 2023-11-12 NOTE — Telephone Encounter (Signed)
 FYI Only or Action Required?: FYI only for provider.  Patient was last seen in primary care on 05/20/2023 by Donzella Lauraine SAILOR, DO.  Called Nurse Triage reporting Neck Pain.  Symptoms began a week ago.  Interventions attempted: OTC medications: Tylenol .  Symptoms are: unchanged.  Triage Disposition: See PCP When Office is Open (Within 3 Days)  Patient/caregiver understands and will follow disposition?: Yes  **Appt. Scheduled 10/29      Copied from CRM #[id number]. Topic: Clinical - Red Word Triage >> Nov 12, 2023  1:31 PM Berwyn MATSU wrote: Red Word that prompted transfer to Nurse Triage: patient has pain due to stiff neck unable to move over 1 week in pain. Reason for Disposition  [1] MODERATE neck pain (e.g., interferes with normal activities) AND [2] present > 3 days  Answer Assessment - Initial Assessment Questions 1. ONSET: When did the pain begin?      X1 week   2. LOCATION: Where does it hurt?      While neck   3. PATTERN Does the pain come and go, or has it been constant since it started?       Constant   4. SEVERITY: How bad is the pain?  (Scale 0-10; or none or slight stiffness, mild, moderate, severe)     Moderate   5. RADIATION: Does the pain go anywhere else, shoot into your arms?     No   6. CORD SYMPTOMS: Any weakness or numbness of the arms or legs?     No   7. CAUSE: What do you think is causing the neck pain?     Unsure   8. NECK OVERUSE: Any recent activities that involved turning or twisting the neck?     No   9. OTHER SYMPTOMS: Do you have any other symptoms? (e.g., headache, fever, chest pain, difficulty breathing, neck swelling) No    Patient stated she has had this type of neck pain before, however this time it is persistent. For OTC medication patient is taking Tylenol . Appt. Scheduled for 10/29 for evaluation.  Protocols used: Neck Pain or Stiffness-A-AH

## 2023-11-13 ENCOUNTER — Ambulatory Visit: Admitting: Family Medicine

## 2023-11-13 ENCOUNTER — Encounter: Payer: Self-pay | Admitting: Family Medicine

## 2023-11-13 VITALS — BP 143/73 | HR 77 | Resp 16 | Wt 130.7 lb

## 2023-11-13 DIAGNOSIS — M7651 Patellar tendinitis, right knee: Secondary | ICD-10-CM | POA: Diagnosis not present

## 2023-11-13 DIAGNOSIS — J302 Other seasonal allergic rhinitis: Secondary | ICD-10-CM | POA: Diagnosis not present

## 2023-11-13 DIAGNOSIS — S161XXA Strain of muscle, fascia and tendon at neck level, initial encounter: Secondary | ICD-10-CM

## 2023-11-13 MED ORDER — MONTELUKAST SODIUM 10 MG PO TABS: 10.0000 mg | ORAL_TABLET | Freq: Every day | ORAL | 5 refills | Status: AC

## 2023-11-13 MED ORDER — METHOCARBAMOL 500 MG PO TABS: 500.0000 mg | ORAL_TABLET | Freq: Two times a day (BID) | ORAL | 0 refills | Status: AC

## 2023-11-13 NOTE — Progress Notes (Signed)
 Established patient visit   Patient: Abigail Acosta   DOB: 1940-06-02   83 y.o. Female  MRN: [id number] Visit Date: 11/13/2023  Today's healthcare provider: Nancyann Perry, MD   Chief Complaint  Patient presents with   Neck Pain    patient has pain due to stiff neck unable to move over 1 week in pain.  OTC medication patient has taken Tylenol .    Medication Refill    Montelukast    Knee Pain    Right knee.Pain with bending. Reports no injury.   Subjective    Discussed the use of AI scribe software for clinical note transcription with the patient, who gave verbal consent to proceed.  History of Present Illness   Abigail Acosta is an 83 year old female who presents with neck and knee pain.  She has been experiencing neck pain for approximately one week, initially noticing a 'creaking' sensation on one side of her neck, which then spread to the other side and eventually to the middle. She describes difficulty turning her head and an inability to drive due to the pain. The pain is alleviated when she sits still and looks straight ahead. She also experienced a headache localized to the base of her neck for a couple of days. She has been using a heat pad on her neck, but it has not provided relief.  She reports knee pain, which she has experienced intermittently in the past. The pain is located at the top of her kneecap and is exacerbated by activities such as running up and down stairs while babysitting her great-grandchildren. She sleeps in a recliner to avoid bending her knee, as straightening it after sleep is painful. She has not taken any medication for the knee pain but prefers using heat over ice.  She mentions chronic back pain, which she believes is due to 'old age and arthritis.' She uses a heat pad for relief. She has a history of using muscle relaxers, such as Flexeril and methocarbamol, in the past for back pain.  She takes Singulair  during allergy season to  manage sinus issues that affect her throat, taking it at bedtime to help keep her throat clear.       Medications: Outpatient Medications Prior to Visit  Medication Sig   acetaminophen  (TYLENOL ) 500 MG tablet Take 500-1,000 mg by mouth 2 (two) times daily as needed (back pain.).   amLODipine  (NORVASC ) 2.5 MG tablet Take 1 tablet (2.5 mg total) by mouth every evening.   Biotin 1000 MCG tablet Take 1,000 mcg by mouth daily with lunch.   colchicine 0.6 MG tablet TAKE 1 TABLET BY MOUTH TWICE A DAY FOR 5 DAYS   ezetimibe  (ZETIA ) 10 MG tablet Take 1 tablet (10 mg total) by mouth daily.   lisinopril -hydrochlorothiazide  (ZESTORETIC ) 10-12.5 MG tablet Take 0.5 tablet daily as needed for SBP >125   loratadine  (CLARITIN ) 10 MG tablet Take 1 tablet (10 mg total) by mouth daily as needed.   Multiple Vitamin (MULTIVITAMIN WITH MINERALS) TABS tablet Take 1 tablet by mouth daily with lunch. Women's Multivitamin   omeprazole  (PRILOSEC) 20 MG capsule Take 1 capsule (20 mg total) by mouth daily.   [DISCONTINUED] montelukast  (SINGULAIR ) 10 MG tablet TAKE 1 TABLET BY MOUTH EVERYDAY AT BEDTIME   [DISCONTINUED] methylPREDNISolone (MEDROL DOSEPAK) 4 MG TBPK tablet Take by mouth.   [DISCONTINUED] predniSONE  (DELTASONE ) 10 MG tablet PLEASE SEE ATTACHED FOR DETAILED DIRECTIONS   [DISCONTINUED] predniSONE  (DELTASONE ) 5 MG tablet TAKE BY MOUTH AS DIRECTED IN  PACKAGE INSTRUCTIONS FOR 6 DAYS   No facility-administered medications prior to visit.   Review of Systems     Objective    BP (!) 143/73 (BP Location: Right Arm, Patient Position: Sitting, Cuff Size: Normal)   Pulse 77   Resp 16   Wt 130 lb 11.2 oz (59.3 kg)   SpO2 100%   BMI 23.91 kg/m     Physical Exam   NECK: Muscle spasms with limited range of motion. MUSCULOSKELETAL: Tenderness at the top of the knee with pain on extension.       Assessment & Plan        Neck muscle spasm Acute neck muscle spasm with limited range of motion and pain  alleviated by sitting still. Methocarbamol preferred for its lower sedative effect. - Prescribe methocarbamol at night, optionally during the day, not exceeding twice daily. - Advise continued use of heat for muscle relaxation.  Left patellar tendinitis Left patellar tendinitis with pain exacerbated by activity. Ice recommended for inflammation. - Advise ice application to the knee three to four times daily. - Recommend elastic knee wrap for support and stress reduction.  Seasonal allergic rhinitis Seasonal allergic rhinitis affecting the throat. Singulair  effective when taken at bedtime during allergy season. - Prescribe Singulair  at bedtime during allergy season.    No follow-ups on file.     Nancyann Perry, MD  [facility] [phone] (phone) [phone] (fax)  [facility]

## 2023-11-27 ENCOUNTER — Encounter: Payer: Self-pay | Admitting: Family Medicine

## 2023-11-27 ENCOUNTER — Ambulatory Visit (INDEPENDENT_AMBULATORY_CARE_PROVIDER_SITE_OTHER): Admitting: Family Medicine

## 2023-11-27 VITALS — BP 144/53 | HR 72 | Temp 97.8°F | Ht 61.5 in | Wt 131.5 lb

## 2023-11-27 DIAGNOSIS — N1832 Chronic kidney disease, stage 3b: Secondary | ICD-10-CM

## 2023-11-27 DIAGNOSIS — J060 Acute laryngopharyngitis: Secondary | ICD-10-CM

## 2023-11-27 DIAGNOSIS — I10 Essential (primary) hypertension: Secondary | ICD-10-CM

## 2023-11-27 DIAGNOSIS — R7989 Other specified abnormal findings of blood chemistry: Secondary | ICD-10-CM

## 2023-11-27 DIAGNOSIS — E7800 Pure hypercholesterolemia, unspecified: Secondary | ICD-10-CM

## 2023-11-27 DIAGNOSIS — E038 Other specified hypothyroidism: Secondary | ICD-10-CM

## 2023-11-27 DIAGNOSIS — R051 Acute cough: Secondary | ICD-10-CM

## 2023-11-27 DIAGNOSIS — N1831 Chronic kidney disease, stage 3a: Secondary | ICD-10-CM

## 2023-11-27 DIAGNOSIS — Z0000 Encounter for general adult medical examination without abnormal findings: Secondary | ICD-10-CM

## 2023-11-27 MED ORDER — AZITHROMYCIN 250 MG PO TABS: ORAL_TABLET | ORAL | 0 refills | Status: AC

## 2023-11-27 MED ORDER — AMLODIPINE BESYLATE 2.5 MG PO TABS: 2.5000 mg | ORAL_TABLET | Freq: Every evening | ORAL | 1 refills | Status: AC

## 2023-11-27 MED ORDER — LISINOPRIL-HYDROCHLOROTHIAZIDE 10-12.5 MG PO TABS
ORAL_TABLET | ORAL | 1 refills | Status: AC
Start: 2023-11-27 — End: ?

## 2023-11-27 NOTE — Progress Notes (Unsigned)
 Chief Complaint  Patient presents with  . Medicare Wellness    Patient is here for also a wellness and 6 month follow up.  Declined vaccine   Feels like she may have a sinus infection.     Subjective:   Abigail Acosta is a 83 y.o. female who presents for a Medicare Annual Wellness Visit.  Allergies (verified) Lovastatin    History: Past Medical History:  Diagnosis Date  . Allergy   . Cataract   . Cataract    right eye-not mature  . Complication of anesthesia   . Elevated LFTs 04/03/2018  . Hypertension   . PONV (postoperative nausea and vomiting)   . Varicose veins    Past Surgical History:  Procedure Laterality Date  . ABDOMINAL HYSTERECTOMY    . ANTERIOR AND POSTERIOR REPAIR N/A 04/28/2013   Procedure: REPAIR CYSTOCELE AND RECTOCELE;  Surgeon: Glendia DELENA Elizabeth, MD;  Location: [facility];  Service: Urology;  Laterality: N/A;  . APPENDECTOMY    . BREAST BIOPSY Left 12/22/2018   Needle Localization  . BREAST BIOPSY Left 12/22/2018   Procedure: BREAST BIOPSY WITH NEEDLE LOCALIZATION;  Surgeon: Lane Shope, MD;  Location: [facility];  Service: General;  Laterality: Left;  . CATARACT EXTRACTION Left   . CYSTOSCOPY N/A 04/28/2013   Procedure: CYSTOSCOPY;  Surgeon: Glendia DELENA Elizabeth, MD;  Location: [facility];  Service: Urology;  Laterality: N/A;  . HERNIA REPAIR Bilateral    inguinal  . VAGINAL PROLAPSE REPAIR N/A 04/28/2013   Procedure: VAULT PROLAPSE AND GRAFT  ;  Surgeon: Glendia DELENA Elizabeth, MD;  Location: [facility];  Service: Urology;  Laterality: N/A;  . VARICOSE VEIN SURGERY Right    stripping   Family History  Adopted: Yes  Problem Relation Age of Onset  . Lung cancer Mother   . Breast cancer Neg Hx    Social History   Occupational History  . Occupation: retired  Tobacco Use  . Smoking status: Never  . Smokeless tobacco: Never  Vaping Use  . Vaping status: Never Used  Substance and Sexual Activity  . Alcohol use: No  . Drug use: No  . Sexual activity:  Not Currently   Tobacco Counseling Counseling given: N/A  SDOH Screenings   Food Insecurity: No Food Insecurity (11/27/2023)  Housing: Low Risk  (11/27/2023)  Transportation Needs: No Transportation Needs (11/27/2023)  Utilities: Not At Risk (11/27/2023)  Alcohol Screen: Low Risk  (04/24/2022)  Depression (PHQ2-9): Low Risk  (11/27/2023)  Financial Resource Strain: Low Risk  (11/27/2023)  Physical Activity: Sufficiently Active (10/07/2019)  Social Connections: Moderately Integrated (10/07/2019)  Stress: No Stress Concern Present (11/27/2023)  Tobacco Use: Low Risk  (11/27/2023)  Health Literacy: Adequate Health Literacy (11/27/2023)   Depression Screen    11/27/2023    9:08 AM 11/13/2023   11:33 AM 05/20/2023    3:15 PM 11/02/2022    1:59 PM 04/24/2022    9:24 AM 10/12/2021   10:21 AM 04/11/2021    1:26 PM  PHQ 2/9 Scores  PHQ - 2 Score 0 0 0 0 0 0 0  PHQ- 9 Score 0  0   0  0  0      Data saved with a previous flowsheet row definition     Goals Addressed   None    Visit info / Clinical Intake: Medicare Wellness Visit Type:: Subsequent Annual Wellness Visit Persons participating in visit and providing information:: patient Medicare Wellness Visit Mode:: In-person (required for WTM) Interpreter Needed?: No Pre-visit  prep was completed: yes AWV questionnaire completed by patient prior to visit?: no Living arrangements:: lives with spouse/significant other Patient's Overall Health Status Rating: good Typical amount of pain: some Does pain affect daily life?: no Are you currently prescribed opioids?: no (muscle relaxer)  Dietary Habits and Nutritional Risks How many meals a day?: 2 Eats fruit and vegetables daily?: yes Most meals are obtained by: preparing own meals In the last 2 weeks, have you had any of the following?: none Diabetic:: no  Functional Status Activities of Daily Living (to include ambulation/medication): Independent Ambulation: Independent Medication  Administration: Independent Home Management (perform basic housework or laundry): Independent Manage your own finances?: yes Primary transportation is: driving Concerns about vision?: no *vision screening is required for WTM* Concerns about hearing?: no (Wears hearing aids.)  Fall Screening Falls in the past year?: 0 Number of falls in past year: 0 Was there an injury with Fall?: 0 Fall Risk Category Calculator: 0 Patient Fall Risk Level: Low Fall Risk  Fall Risk Patient at Risk for Falls Due to: No Fall Risks  Home and Transportation Safety: All rugs have non-skid backing?: yes All stairs or steps have railings?: N/A, no stairs Grab bars in the bathtub or shower?: yes Have non-skid surface in bathtub or shower?: (!) no Good home lighting?: yes Regular seat belt use?: yes Hospital stays in the last year:: no  Cognitive Assessment Difficulty concentrating, remembering, or making decisions? : no Will 6CIT or Mini Cog be Completed: no 6CIT or Mini Cog Declined: patient alert, oriented, able to answer questions appropriately and recall recent events What year is it?: 0 points What month is it?: 0 points Give patient an address phrase to remember (5 components): [place] Sharps [address] About what time is it?: 0 points Count backwards from 20 to 1: 0 points Say the months of the year in reverse: 0 points Repeat the address phrase from earlier: 0 points 6 CIT Score: 0 points  Advance Directives (For Healthcare) Does Patient Have a Medical Advance Directive?: Yes Type of Advance Directive: Living will; Healthcare Power of Attorney        Objective:    Today's Vitals   11/27/23 0902  BP: (!) 144/53  Pulse: 72  Temp: 97.8 F (36.6 C)  TempSrc: Oral  SpO2: 100%  Weight: 131 lb 8 oz (59.6 kg)  Height: 5' 1.5 (1.562 m)   Body mass index is 24.44 kg/m.  Current Medications (verified) Outpatient Encounter Medications as of 11/27/2023  Medication Sig  .  acetaminophen  (TYLENOL ) 500 MG tablet Take 500-1,000 mg by mouth 2 (two) times daily as needed (back pain.).  . [EXPIRED] azithromycin  (ZITHROMAX ) 250 MG tablet Take 2 tablets on day 1, then 1 tablet daily on days 2 through 5  . Biotin 1000 MCG tablet Take 1,000 mcg by mouth daily with lunch.  . cetirizine (ZYRTEC) 10 MG chewable tablet Chew 10 mg by mouth daily.  . ezetimibe  (ZETIA ) 10 MG tablet Take 1 tablet (10 mg total) by mouth daily.  . methocarbamol  (ROBAXIN ) 500 MG tablet Take 1 tablet (500 mg total) by mouth 2 (two) times daily.  . montelukast  (SINGULAIR ) 10 MG tablet Take 1 tablet (10 mg total) by mouth at bedtime.  . Multiple Vitamin (MULTIVITAMIN WITH MINERALS) TABS tablet Take 1 tablet by mouth daily with lunch. Women's Multivitamin  . omeprazole  (PRILOSEC) 20 MG capsule Take 1 capsule (20 mg total) by mouth daily.  . [DISCONTINUED] amLODipine  (NORVASC ) 2.5 MG tablet Take 1  tablet (2.5 mg total) by mouth every evening.  . [DISCONTINUED] colchicine 0.6 MG tablet TAKE 1 TABLET BY MOUTH TWICE A DAY FOR 5 DAYS  . [DISCONTINUED] lisinopril -hydrochlorothiazide  (ZESTORETIC ) 10-12.5 MG tablet Take 0.5 tablet daily as needed for SBP >125  . [DISCONTINUED] loratadine  (CLARITIN ) 10 MG tablet Take 1 tablet (10 mg total) by mouth daily as needed.  . amLODipine  (NORVASC ) 2.5 MG tablet Take 1 tablet (2.5 mg total) by mouth every evening.  . lisinopril -hydrochlorothiazide  (ZESTORETIC ) 10-12.5 MG tablet Take 0.5 tablet daily as needed for SBP >125   No facility-administered encounter medications on file as of 11/27/2023.   Hearing/Vision screen No results found.  Immunizations and Health Maintenance Health Maintenance  Topic Date Due  . Medicare Annual Wellness (AWV)  04/24/2023  . DTaP/Tdap/Td (1 - Tdap) 01/01/2024 (Originally 08/10/1959)  . Influenza Vaccine  04/14/2024 (Originally 08/16/2023)  . Mammogram  09/10/2024  . Bone Density Scan  06/09/2026  . Pneumococcal Vaccine: 50+ Years   Completed  . Zoster Vaccines- Shingrix  Completed  . Meningococcal B Vaccine  Aged Out  . Colonoscopy  Discontinued  . COVID-19 Vaccine  Discontinued  . Hepatitis C Screening  Discontinued        Assessment/Plan:  This is a routine wellness examination for Leana.  Patient Care Team: Donzella Lauraine SAILOR, DO as PCP - General (Family Medicine) End, Lonni, MD as PCP - Cardiology (Cardiology) [facility], [facility])  I have personally reviewed and noted the following in the patient's chart:   Medical and social history Use of alcohol, tobacco or illicit drugs  Current medications and supplements including opioid prescriptions. Functional ability and status Nutritional status Physical activity Advanced directives List of other physicians Hospitalizations, surgeries, and ER visits in previous 12 months Vitals Screenings to include cognitive, depression, and falls Referrals and appointments  Orders Placed This Encounter  Procedures  . Microalbumin / creatinine urine ratio  . Comprehensive metabolic panel with GFR  . Lipid panel  . TSH Rfx on Abnormal to Free T4  . Vitamin B12   In addition, I have reviewed and discussed with patient certain preventive protocols, quality metrics, and best practice recommendations. A written personalized care plan for preventive services as well as general preventive health recommendations were provided to patient.   Payeton Germani N Keysean Savino, DO   12/19/2023   No follow-ups on file.  After Visit Summary: (In Person-Declined) Patient declined AVS at this time.  ___________________________________________________________________         Established patient visit   Patient: Abigail Acosta   DOB: 05-Mar-1940   83 y.o. Female  MRN: [id number] Visit Date: 11/27/2023  Today's healthcare provider: LAURAINE SAILOR DONZELLA, DO   Chief Complaint  Patient presents with  . Medicare Wellness    Patient is here for also a wellness and 6 month  follow up.  Declined vaccine   Feels like she may have a sinus infection.   Subjective    HPI Zaylei Mullane is an 83 year old female who presents with a sinus infection and persistent cough.  She has been experiencing symptoms of a sinus infection for the past five days, including a persistent cough and throat irritation. The cough is severe, described as 'coughing my head off,' and is accompanied by thick mucus in her throat. She lost her voice on Saturday but regained it by Monday. The cough is described as a 'drain tickle' and is a recurring issue she experiences annually, typically lasting up to a month  without antibiotic treatment.  She has a history of allergies to trees, grass, and leaves, which she believes contribute to her current symptoms. She takes montelukast  (Singulair ) and Zyrtec regularly during allergy flare-ups, and loratadine  in the morning when symptomatic. She does not take these medications when asymptomatic, except for Zyrtec, which she takes nightly.  She mentions a recent episode of neck stiffness lasting four days, for which she was prescribed a muscle relaxer, methocarbamol . She took the medication for two days but discontinued it due to feeling 'fuzzy' and not herself.  She is concerned about the potential for being contagious as she visits her son in a nursing home every other day. She also mentions a history of avoiding carbohydrates due to her husband's diabetes, although she has no personal history of blood sugar issues.  No shortness of breath but reports coughing to the point of gagging. She experienced difficulty swallowing for two days. She denies any problems with blood sugar and is not currently taking any opioid medications.    {History (Optional):23778}  Medications: Outpatient Medications Prior to Visit  Medication Sig Note  . acetaminophen  (TYLENOL ) 500 MG tablet Take 500-1,000 mg by mouth 2 (two) times daily as needed (back pain.).    SABRA Biotin 1000 MCG tablet Take 1,000 mcg by mouth daily with lunch.   . cetirizine (ZYRTEC) 10 MG chewable tablet Chew 10 mg by mouth daily.   . ezetimibe  (ZETIA ) 10 MG tablet Take 1 tablet (10 mg total) by mouth daily.   . methocarbamol  (ROBAXIN ) 500 MG tablet Take 1 tablet (500 mg total) by mouth 2 (two) times daily.   . montelukast  (SINGULAIR ) 10 MG tablet Take 1 tablet (10 mg total) by mouth at bedtime.   . Multiple Vitamin (MULTIVITAMIN WITH MINERALS) TABS tablet Take 1 tablet by mouth daily with lunch. Women's Multivitamin   . omeprazole  (PRILOSEC) 20 MG capsule Take 1 capsule (20 mg total) by mouth daily.   . [DISCONTINUED] amLODipine  (NORVASC ) 2.5 MG tablet Take 1 tablet (2.5 mg total) by mouth every evening.   . [DISCONTINUED] colchicine 0.6 MG tablet TAKE 1 TABLET BY MOUTH TWICE A DAY FOR 5 DAYS   . [DISCONTINUED] lisinopril -hydrochlorothiazide  (ZESTORETIC ) 10-12.5 MG tablet Take 0.5 tablet daily as needed for SBP >125   . [DISCONTINUED] loratadine  (CLARITIN ) 10 MG tablet Take 1 tablet (10 mg total) by mouth daily as needed. 11/27/2023: switched to zyrtec   No facility-administered medications prior to visit.    {Insert previous labs (optional):23779} {See past labs  Heme  Chem  Endocrine  Serology  Results Review (optional):1}   Objective    BP (!) 144/53 (BP Location: Left Arm, Patient Position: Sitting, Cuff Size: Normal)   Pulse 72   Temp 97.8 F (36.6 C) (Oral)   Ht 5' 1.5 (1.562 m)   Wt 131 lb 8 oz (59.6 kg)   SpO2 100%   BMI 24.44 kg/m  {Insert last BP/Wt (optional):23777}{See vitals history (optional):1}   Physical Exam Constitutional:      Appearance: Normal appearance.  HENT:     Head: Normocephalic and atraumatic.  Eyes:     General: No scleral icterus.    Extraocular Movements: Extraocular movements intact.     Conjunctiva/sclera: Conjunctivae normal.  Cardiovascular:     Rate and Rhythm: Normal rate and regular rhythm.     Pulses: Normal pulses.      Heart sounds: Normal heart sounds.  Pulmonary:     Effort: Pulmonary effort is normal. No respiratory distress.  Breath sounds: Normal breath sounds.  Abdominal:     General: Bowel sounds are normal. There is no distension.     Palpations: Abdomen is soft. There is no mass.     Tenderness: There is no abdominal tenderness. There is no guarding.  Musculoskeletal:     Right lower leg: No edema.     Left lower leg: No edema.  Skin:    General: Skin is warm and dry.  Neurological:     Mental Status: She is alert and oriented to person, place, and time. Mental status is at baseline.  Psychiatric:        Mood and Affect: Mood normal.        Behavior: Behavior normal.      No results found for any visits on 11/27/23.  Assessment & Plan    Encounter for Medicare annual wellness exam  Acute laryngopharyngitis -     Azithromycin ; Take 2 tablets on day 1, then 1 tablet daily on days 2 through 5  Dispense: 6 tablet; Refill: 0  Acute cough  Essential hypertension -     Lisinopril -hydroCHLOROthiazide ; Take 0.5 tablet daily as needed for SBP >125  Dispense: 45 tablet; Refill: 1 -     amLODIPine  Besylate; Take 1 tablet (2.5 mg total) by mouth every evening.  Dispense: 90 tablet; Refill: 1 -     Comprehensive metabolic panel with GFR  Stage 3a chronic kidney disease (HCC) -     amLODIPine  Besylate; Take 1 tablet (2.5 mg total) by mouth every evening.  Dispense: 90 tablet; Refill: 1  Elevated vitamin B12 level -     Vitamin B12  Hypercholesteremia -     Lipid panel  Stage 3b chronic kidney disease (HCC) -     Microalbumin / creatinine urine ratio  Subclinical hypothyroidism -     TSH Rfx on Abnormal to Free T4      Encounter for Medicare Annual Wellness exam Wellness visit with acute concerns including laryngopharyngitis, cough, and allergic rhinitis. Blood work deferred due to current symptoms. Tetanus vaccine due and strongly recommended due to increased risk of  pertussis. - Ordered blood work to be completed when symptoms improve. - Recommended tetanus vaccine at pharmacy when feeling better.  Acute laryngopharyngitis; acute cough Acute laryngopharyngitis with persistent cough likely related to allergies. No signs of bacterial infection. Symptoms not contagious and likely allergy-related. - Prescribed azithromycin  (Z-Pak) to be picked up at pharmacy if symptoms do not improve. - Advised use of alcohol-free cough medicine if needed. - Recommended honey, green tea, and salt water  gargles for throat comfort.  Allergic rhinitis Chronic allergic rhinitis with seasonal exacerbations. Symptoms likely due to allergies to trees, grass, and leaves. Current medications include montelukast  and loratadine , with cetirizine used as needed. - Continue montelukast  and loratadine  as needed. - Use cetirizine as needed for symptom control.  Essential hypertension Chronic, mildly elevated today.  ***  No follow-ups on file.      I discussed the assessment and treatment plan with the patient  The patient was provided an opportunity to ask questions and all were answered. The patient agreed with the plan and demonstrated an understanding of the instructions.   The patient was advised to call back or seek an in-person evaluation if the symptoms worsen or if the condition fails to improve as anticipated.    LAURAINE LOISE BUOY, DO  [facility] [phone] (phone) [phone] (fax)  [facility]

## 2023-11-27 NOTE — Patient Instructions (Signed)
 Recommend Tdap (tetanus, diphtheria and pertussis (whooping cough)) at the pharmacy when you are feeling better.

## 2023-12-25 ENCOUNTER — Ambulatory Visit: Payer: Self-pay | Admitting: Family Medicine

## 2023-12-25 LAB — COMPREHENSIVE METABOLIC PANEL WITH GFR
ALT: 16 IU/L (ref 0–32)
AST: 25 IU/L (ref 0–40)
Albumin: 4.8 g/dL — ABNORMAL HIGH (ref 3.7–4.7)
Alkaline Phosphatase: 58 IU/L (ref 48–129)
BUN/Creatinine Ratio: 17 (ref 12–28)
BUN: 23 mg/dL (ref 8–27)
Bilirubin Total: 0.4 mg/dL (ref 0.0–1.2)
CO2: 20 mmol/L (ref 20–29)
Calcium: 9.7 mg/dL (ref 8.7–10.3)
Chloride: 105 mmol/L (ref 96–106)
Creatinine, Ser: 1.36 mg/dL — ABNORMAL HIGH (ref 0.57–1.00)
Globulin, Total: 2.6 g/dL (ref 1.5–4.5)
Glucose: 86 mg/dL (ref 70–99)
Potassium: 3.7 mmol/L (ref 3.5–5.2)
Sodium: 140 mmol/L (ref 134–144)
Total Protein: 7.4 g/dL (ref 6.0–8.5)
eGFR: 39 mL/min/1.73 — ABNORMAL LOW (ref >59)

## 2023-12-25 LAB — LIPID PANEL
Chol/HDL Ratio: 3.4 ratio (ref 0.0–4.4)
Cholesterol, Total: 224 mg/dL — ABNORMAL HIGH (ref 100–199)
HDL: 65 mg/dL (ref >39)
LDL Chol Calc (NIH): 132 mg/dL — ABNORMAL HIGH (ref 0–99)
Triglycerides: 151 mg/dL — ABNORMAL HIGH (ref 0–149)
VLDL Cholesterol Cal: 27 mg/dL (ref 5–40)

## 2023-12-25 LAB — T4F: T4,Free (Direct): 1.08 ng/dL (ref 0.82–1.77)

## 2023-12-25 LAB — TSH RFX ON ABNORMAL TO FREE T4: TSH: 6.51 u[IU]/mL — ABNORMAL HIGH (ref 0.450–4.500)

## 2023-12-25 LAB — VITAMIN B12: Vitamin B-12: >2000 pg/mL — ABNORMAL HIGH (ref 232–1245)

## 2023-12-25 LAB — MICROALBUMIN / CREATININE URINE RATIO
Creatinine, Urine: 42.3 mg/dL
Microalb/Creat Ratio: 10 mg/g{creat} (ref 0–29)
Microalbumin, Urine: 4.3 ug/mL

## 2024-03-04 ENCOUNTER — Ambulatory Visit: Admitting: Internal Medicine

## 2024-05-11 ENCOUNTER — Encounter: Admitting: Dermatology
